# Patient Record
Sex: Male | Born: 1985 | Race: Black or African American | Hispanic: No | Marital: Single | Smoking: Former smoker
Health system: Southern US, Community
[De-identification: ages and names within clinical notes are randomized; demographics above are authoritative.]

## PROBLEM LIST (undated history)

## (undated) DIAGNOSIS — I509 Heart failure, unspecified: Secondary | ICD-10-CM

## (undated) DIAGNOSIS — J449 Chronic obstructive pulmonary disease, unspecified: Secondary | ICD-10-CM

## (undated) DIAGNOSIS — G4730 Sleep apnea, unspecified: Secondary | ICD-10-CM

## (undated) DIAGNOSIS — J45909 Unspecified asthma, uncomplicated: Secondary | ICD-10-CM

## (undated) DIAGNOSIS — E119 Type 2 diabetes mellitus without complications: Secondary | ICD-10-CM

## (undated) DIAGNOSIS — E785 Hyperlipidemia, unspecified: Secondary | ICD-10-CM

## (undated) DIAGNOSIS — R0902 Hypoxemia: Secondary | ICD-10-CM

## (undated) DIAGNOSIS — I10 Essential (primary) hypertension: Secondary | ICD-10-CM

## (undated) DIAGNOSIS — E669 Obesity, unspecified: Secondary | ICD-10-CM

## (undated) HISTORY — DX: Hypoxemia: R09.02

## (undated) HISTORY — PX: TONSILLECTOMY: SUR1361

## (undated) HISTORY — DX: Chronic obstructive pulmonary disease, unspecified: J44.9

## (undated) HISTORY — DX: Heart failure, unspecified: I50.9

## (undated) HISTORY — DX: Unspecified asthma, uncomplicated: J45.909

## (undated) HISTORY — DX: Hyperlipidemia, unspecified: E78.5

---
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
## 2005-03-25 ENCOUNTER — Emergency Department (HOSPITAL_COMMUNITY): Admission: EM | Admit: 2005-03-25 | Discharge: 2005-03-25 | Payer: Self-pay | Admitting: Emergency Medicine

## 2010-10-10 ENCOUNTER — Emergency Department: Payer: Self-pay | Admitting: Emergency Medicine

## 2010-10-15 ENCOUNTER — Emergency Department (HOSPITAL_COMMUNITY)
Admission: EM | Admit: 2010-10-15 | Discharge: 2010-10-15 | Payer: Self-pay | Source: Home / Self Care | Admitting: Emergency Medicine

## 2010-10-15 LAB — RAPID STREP SCREEN (MED CTR MEBANE ONLY): Streptococcus, Group A Screen (Direct): NEGATIVE

## 2011-05-04 ENCOUNTER — Emergency Department: Payer: Self-pay | Admitting: Emergency Medicine

## 2011-12-12 ENCOUNTER — Emergency Department (HOSPITAL_COMMUNITY)
Admission: EM | Admit: 2011-12-12 | Discharge: 2011-12-12 | Disposition: A | Discharge status: Home / Self Care | Payer: Self-pay | Attending: Emergency Medicine | Admitting: Emergency Medicine

## 2011-12-12 ENCOUNTER — Encounter (HOSPITAL_COMMUNITY): Payer: Self-pay | Admitting: *Deleted

## 2011-12-12 DIAGNOSIS — H9209 Otalgia, unspecified ear: Secondary | ICD-10-CM | POA: Insufficient documentation

## 2011-12-12 DIAGNOSIS — J029 Acute pharyngitis, unspecified: Secondary | ICD-10-CM | POA: Insufficient documentation

## 2011-12-12 HISTORY — DX: Obesity, unspecified: E66.9

## 2011-12-12 LAB — RAPID STREP SCREEN (MED CTR MEBANE ONLY): Streptococcus, Group A Screen (Direct): NEGATIVE

## 2011-12-12 MED ORDER — AMOXICILLIN 250 MG/5ML PO SUSR: 500.0000 mg | Freq: Three times a day (TID) | ORAL | Status: AC

## 2011-12-12 MED ORDER — NAPROXEN 500 MG PO TABS: 500.0000 mg | ORAL_TABLET | Freq: Two times a day (BID) | ORAL | Status: AC

## 2011-12-12 MED ORDER — ACETAMINOPHEN 325 MG PO TABS
650.0000 mg | ORAL_TABLET | Freq: Once | ORAL | Status: AC
  Administered 2011-12-12: 650 mg via ORAL
  Filled 2011-12-12: qty 2

## 2011-12-12 MED ORDER — AMOXICILLIN 500 MG PO CAPS: 500.0000 mg | ORAL_CAPSULE | Freq: Three times a day (TID) | ORAL | Status: DC

## 2011-12-12 NOTE — ED Notes (Signed)
Pt reports having sore throat. Airway is intact, resp e/u.

## 2011-12-12 NOTE — ED Provider Notes (Signed)
History     CSN: [id number]  Arrival date & time 12/12/11  1738   First MD Initiated Contact with Patient 12/12/11 1923      Chief Complaint  Patient presents with  . Sore Throat    HPI The patient presents the emergency room with complaints of a sore throat. Patient states the symptoms started last couple days. He has pain with swallowing. He also has noticed pain in his right ear. He denies any cough, fever, nasal congestion, vomiting or diarrhea. The pain is moderate. Past Medical History  Diagnosis Date  . Obesity     History reviewed. No pertinent past surgical history.  History reviewed. No pertinent family history.  History  Substance Use Topics  . Smoking status: Current Everyday Smoker    Types: Cigarettes  . Smokeless tobacco: Not on file  . Alcohol Use: No      Review of Systems  All other systems reviewed and are negative.    Allergies  Review of patient's allergies indicates no known allergies.  Home Medications  No current outpatient prescriptions on file.  BP 164/94  Pulse 92  Temp(Src) 99.2 F (37.3 C) (Oral)  Resp 20  SpO2 97%  Physical Exam  Nursing note and vitals reviewed. Constitutional: He appears well-developed and well-nourished. No distress.  HENT:  Head: Normocephalic and atraumatic.  Right Ear: External ear normal.  Left Ear: External ear normal.  Mouth/Throat: Oropharyngeal exudate and posterior oropharyngeal erythema present. No posterior oropharyngeal edema or tonsillar abscesses.       Right ear occluded by cerumen  Eyes: Conjunctivae are normal. Right eye exhibits no discharge. Left eye exhibits no discharge. No scleral icterus.  Neck: Neck supple. No tracheal deviation present.  Cardiovascular: Normal rate and regular rhythm.   Pulmonary/Chest: Effort normal. No stridor. No respiratory distress. He has no wheezes. He has no rales.  Musculoskeletal: He exhibits no edema.  Neurological: He is alert. Cranial nerve deficit:  no gross deficits.  Skin: Skin is warm and dry. No rash noted.  Psychiatric: He has a normal mood and affect.    ED Course  Procedures (including critical care time)   Labs Reviewed  RAPID STREP SCREEN   No results found.    MDM  Patient has pharyngitis without evidence of abscess.  I am unable to fully evaluate his right ear because of cerumen occlusion.  We'll prescribe the patient a course of amoxicillin. I recommend he follow up with primary if not getting better within a week       Celene Kras, MD 12/12/11 1944

## 2011-12-12 NOTE — Discharge Instructions (Signed)
Sore Throat Sore throats may be caused by bacteria and viruses. They may also be caused by:  Smoking.   Pollution.   Allergies.  If a sore throat is due to strep infection (a bacterial infection), you may need:  A throat swab.   A culture test to verify the strep infection.  You will need one of these:  An antibiotic shot.   Oral medicine for a full 10 days.  Strep infection is very contagious. A doctor should check any close contacts who have a sore throat or fever. A sore throat caused by a virus infection will usually last only 3-4 days. Antibiotics will not treat a viral sore throat.  Infectious mononucleosis (a viral disease), however, can cause a sore throat that lasts for up to 3 weeks. Mononucleosis can be diagnosed with blood tests. You must have been sick for at least 1 week in order for the test to give accurate results. HOME CARE INSTRUCTIONS   To treat a sore throat, take mild pain medicine.   Increase your fluids.   Eat a soft diet.   Do not smoke.   Gargling with warm water or salt water (1 tsp. salt in 8 oz. water) can be helpful.   Try throat sprays or lozenges or sucking on hard candy to ease the symptoms.  Call your doctor if your sore throat lasts longer than 1 week.  SEEK IMMEDIATE MEDICAL CARE IF:  You have difficulty breathing.   You have increased swelling in the throat.   You have pain so severe that you are unable to swallow fluids or your saliva.   You have a severe headache, a high fever, vomiting, or a red rash.  Document Released: 11/05/2004 Document Revised: 06/10/2011 Document Reviewed: 09/15/2007 [facility] Patient Information 2012 [place], [place].Salt Water Gargle This solution will help make your mouth and throat feel better. HOME CARE INSTRUCTIONS   Mix 1 teaspoon of salt in 8 ounces of warm water.   Gargle with this solution as much or often as you need or as directed. Swish and gargle gently if you have any sores or wounds in your  mouth.   Do not swallow this mixture.  Document Released: 07/02/2004 Document Revised: 06/10/2011 Document Reviewed: 11/23/2008 [facility] Patient Information 2012 [place], [place].

## 2015-05-01 ENCOUNTER — Encounter: Payer: Self-pay | Admitting: Emergency Medicine

## 2015-05-01 ENCOUNTER — Emergency Department
Admission: EM | Admit: 2015-05-01 | Discharge: 2015-05-01 | Disposition: A | Discharge status: Home / Self Care | Payer: Self-pay | Attending: Emergency Medicine | Admitting: Emergency Medicine

## 2015-05-01 ENCOUNTER — Other Ambulatory Visit: Payer: Self-pay

## 2015-05-01 DIAGNOSIS — G510 Bell's palsy: Secondary | ICD-10-CM | POA: Insufficient documentation

## 2015-05-01 DIAGNOSIS — R739 Hyperglycemia, unspecified: Secondary | ICD-10-CM | POA: Insufficient documentation

## 2015-05-01 DIAGNOSIS — Z720 Tobacco use: Secondary | ICD-10-CM | POA: Insufficient documentation

## 2015-05-01 DIAGNOSIS — I10 Essential (primary) hypertension: Secondary | ICD-10-CM | POA: Insufficient documentation

## 2015-05-01 HISTORY — DX: Sleep apnea, unspecified: G47.30

## 2015-05-01 LAB — COMPREHENSIVE METABOLIC PANEL
ALT: 41 U/L (ref 17–63)
AST: 30 U/L (ref 15–41)
Albumin: 3.7 g/dL (ref 3.5–5.0)
Alkaline Phosphatase: 69 U/L (ref 38–126)
Anion gap: 10 (ref 5–15)
BUN: 11 mg/dL (ref 6–20)
CO2: 29 mmol/L (ref 22–32)
Calcium: 8.6 mg/dL — ABNORMAL LOW (ref 8.9–10.3)
Chloride: 101 mmol/L (ref 101–111)
Creatinine, Ser: 1.13 mg/dL (ref 0.61–1.24)
GFR calc Af Amer: >60 mL/min (ref >60)
GFR calc non Af Amer: >60 mL/min (ref >60)
Glucose, Bld: 210 mg/dL — ABNORMAL HIGH (ref 65–99)
Potassium: 3.9 mmol/L (ref 3.5–5.1)
Sodium: 140 mmol/L (ref 135–145)
Total Bilirubin: 0.5 mg/dL (ref 0.3–1.2)
Total Protein: 7.5 g/dL (ref 6.5–8.1)

## 2015-05-01 LAB — CBC WITH DIFFERENTIAL/PLATELET
Basophils Absolute: 0 10*3/uL (ref 0–0.1)
Basophils Relative: 0 %
Eosinophils Absolute: 0.1 10*3/uL (ref 0–0.7)
Eosinophils Relative: 1 %
HCT: 43.6 % (ref 40.0–52.0)
Hemoglobin: 13.8 g/dL (ref 13.0–18.0)
Lymphocytes Relative: 24 %
Lymphs Abs: 2.2 10*3/uL (ref 1.0–3.6)
MCH: 27.6 pg (ref 26.0–34.0)
MCHC: 31.7 g/dL — ABNORMAL LOW (ref 32.0–36.0)
MCV: 87.1 fL (ref 80.0–100.0)
Monocytes Absolute: 0.6 10*3/uL (ref 0.2–1.0)
Monocytes Relative: 6 %
Neutro Abs: 6.2 10*3/uL (ref 1.4–6.5)
Neutrophils Relative %: 69 %
Platelets: 239 10*3/uL (ref 150–440)
RBC: 5 MIL/uL (ref 4.40–5.90)
RDW: 16.5 % — ABNORMAL HIGH (ref 11.5–14.5)
WBC: 9 10*3/uL (ref 3.8–10.6)

## 2015-05-01 LAB — TROPONIN I: Troponin I: 0.03 ng/mL (ref <0.031)

## 2015-05-01 MED ORDER — VALACYCLOVIR HCL 1 G PO TABS: 1000.0000 mg | ORAL_TABLET | Freq: Three times a day (TID) | ORAL | Status: DC

## 2015-05-01 MED ORDER — POLYMYXIN B-TRIMETHOPRIM 10000-0.1 UNIT/ML-% OP SOLN: 2.0000 [drp] | OPHTHALMIC | Status: DC

## 2015-05-01 MED ORDER — PREDNISONE 10 MG PO TABS: 10.0000 mg | ORAL_TABLET | Freq: Every day | ORAL | Status: DC

## 2015-05-01 MED ORDER — METFORMIN HCL 500 MG PO TABS: 500.0000 mg | ORAL_TABLET | Freq: Two times a day (BID) | ORAL | Status: DC

## 2015-05-01 MED ORDER — HYDROCHLOROTHIAZIDE 25 MG PO TABS: 25.0000 mg | ORAL_TABLET | Freq: Every day | ORAL | Status: DC

## 2015-05-01 MED ORDER — PREDNISONE 20 MG PO TABS
60.0000 mg | ORAL_TABLET | Freq: Once | ORAL | Status: AC
Start: 2015-05-01 — End: 2015-05-01
  Administered 2015-05-01: 60 mg via ORAL
  Filled 2015-05-01: qty 3

## 2015-05-01 NOTE — ED Provider Notes (Signed)
[facility] Emergency Department Provider Note     Time seen: ----------------------------------------- 2:27 PM on 05/01/2015 -----------------------------------------    I have reviewed the triage vital signs and the nursing notes.   HISTORY  Chief Complaint Cellulitis    HPI Ross Schmidt is a 29 y.o. male who presents ER with right-sided facial swelling, numbness and paresthesias for the last 5 days. Patient states when he tries to drink something water runs out of his mouth, he can't close his eye as well on the right side. He says his right eye has been matted after sleep. He denies having this happen before. Has never had his blood pressure checked before. Nothing makes his symptoms better or worse.  Past Medical History  Diagnosis Date  . Obesity   . Sleep apnea     There are no active problems to display for this patient.   Past Surgical History  Procedure Laterality Date  . Tonsillectomy      Allergies Review of patient's allergies indicates no known allergies.  Social History History  Substance Use Topics  . Smoking status: Current Every Day Smoker    Types: Cigarettes  . Smokeless tobacco: Not on file  . Alcohol Use: No    Review of Systems Constitutional: Negative for fever. Eyes: Negative for visual changes. ENT: Negative for sore throat. Cardiovascular: Negative for chest pain. Respiratory: Negative for shortness of breath. Gastrointestinal: Negative for abdominal pain, vomiting and diarrhea. Genitourinary: Negative for dysuria. Musculoskeletal: Negative for back pain. Skin: Negative for rash. Neurological: Positive for right-sided facial paresthesias, paralysis  10-point ROS otherwise negative.  ____________________________________________   PHYSICAL EXAM:  VITAL SIGNS: ED Triage Vitals  Enc Vitals Group     BP 05/01/15 1405 181/100 mmHg     Pulse Rate 05/01/15 1405 103     Resp --      Temp 05/01/15  1405 99 F (37.2 C)     Temp Source 05/01/15 1405 Oral     SpO2 05/01/15 1405 90 %     Weight 05/01/15 1407 361 lb (163.749 kg)     Height 05/01/15 1405  (1.753 m)     Head Cir --      Peak Flow --      Pain Score --      Pain Loc --      Pain Edu? --      Excl. in GC? --     Constitutional: Alert and oriented. Well appearing and in no distress. Morbidly obese Eyes: Conjunctivae are normal. PERRL. Normal extraocular movements. There are dried secretions noted around the right eye ENT   Head: Normocephalic and atraumatic.   Nose: No congestion/rhinnorhea.   Mouth/Throat: Mucous membranes are moist.   Neck: No stridor. Cardiovascular: Normal rate, regular rhythm. Normal and symmetric distal pulses are present in all extremities. No murmurs, rubs, or gallops. Respiratory: Normal respiratory effort without tachypnea nor retractions. Breath sounds are clear and equal bilaterally. No wheezes/rales/rhonchi. Gastrointestinal: Soft and nontender. No distention. No abdominal bruits. There is no CVA tenderness. Musculoskeletal: Nontender with normal range of motion in all extremities. No joint effusions.  No lower extremity tenderness nor edema. Neurologic:  Normal speech and language. 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 Forehead is not spared Skin:  Skin is warm, dry and intact. No rash noted. Psychiatric: Mood and affect are normal. Speech and behavior are normal. Patient exhibits appropriate insight and judgment. ____________________________________________  EKG: Interpreted by me. Normal sinus rhythm, ST  and T-wave changes, rightward axis. Borderline long QT interval. Rate is 97 bpm  ____________________________________________  ED COURSE:  Pertinent labs & imaging results that were available during my care of the patient were reviewed by me and considered in my medical decision making (see chart for details). Patient clinically with Bell's palsy,  however noted to be borderline hypoxic and markedly hypertensive. Patient will receive basic lab work and reevaluation ____________________________________________    LABS (pertinent positives/negatives)  Labs Reviewed  CBC WITH DIFFERENTIAL/PLATELET - Abnormal; Notable for the following:    MCHC 31.7 (*)    RDW 16.5 (*)    All other components within normal limits  COMPREHENSIVE METABOLIC PANEL - Abnormal; Notable for the following:    Glucose, Bld 210 (*)    Calcium 8.6 (*)    All other components within normal limits  TROPONIN I    ____________________________________________  FINAL ASSESSMENT AND PLAN  Bell's palsy, hypertension, hypoglycemia  Plan: Patient with labs and imaging as dictated above. Patient likely has developed diabetes, will start metformin. We'll also need to be started on high blood pressure medication. Patient be given steroid taper as well as valacyclovir to treat his Bell's palsy. He is encouraged to have close follow-up with his doctor for reevaluation.   Emily Filbert, MD   Emily Filbert, MD 05/01/15 [phone]

## 2015-05-01 NOTE — ED Notes (Signed)
Pt with right side of face swollen, numb x 5 days. Eye matted after sleep.

## 2015-05-01 NOTE — ED Notes (Signed)
Pt requesting something to eat at this time, verbal okay given by MD williams. Pt given sandwhich tray at this time, eating in bed tolerating well.

## 2015-05-01 NOTE — Discharge Instructions (Signed)
Bell's Palsy Bell's palsy is a condition in which the muscles on one side of the face cannot move (paralysis). This is because the nerves in the face are paralyzed. It is most often thought to be caused by a virus. The virus causes swelling of the nerve that controls movement on one side of the face. The nerve travels through a tight space surrounded by bone. When the nerve swells, it can be compressed by the bone. This results in damage to the protective covering around the nerve. This damage interferes with how the nerve communicates with the muscles of the face. As a result, it can cause weakness or paralysis of the facial muscles.  Injury (trauma), tumor, and surgery may cause Bell's palsy, but most of the time the cause is unknown. It is a relatively common condition. It starts suddenly (abrupt onset) with the paralysis usually ending within 2 days. Bell's palsy is not dangerous. But because the eye does not close properly, you may need care to keep the eye from getting dry. This can include splinting (to keep the eye shut) or moistening with artificial tears. Bell's palsy very seldom occurs on both sides of the face at the same time. SYMPTOMS   Eyebrow sagging.  Drooping of the eyelid and corner of the mouth.  Inability to close one eye.  Loss of taste on the front of the tongue.  Sensitivity to loud noises. TREATMENT  The treatment is usually non-surgical. If the patient is seen within the first 24 to 48 hours, a short course of steroids may be prescribed, in an attempt to shorten the length of the condition. Antiviral medicines may also be used with the steroids, but it is unclear if they are helpful.  You will need to protect your eye, if you cannot close it. The cornea (clear covering over your eye) will become dry and can be damaged. Artificial tears can be used to keep your eye moist. Glasses or an eye patch should be worn to protect your eye. PROGNOSIS  Recovery is variable, ranging  from days to months. Although the problem usually goes away completely (about 80% of cases resolve), predicting the outcome is impossible. Most people improve within 3 weeks of when the symptoms began. Improvement may continue for 3 to 6 months. A small number of people have moderate to severe weakness that is permanent.  HOME CARE INSTRUCTIONS   If your caregiver prescribed medication to reduce swelling in the nerve, use as directed. Do not stop taking the medication unless directed by your caregiver.  Use moisturizing eye drops as needed to prevent drying of your eye, as directed by your caregiver.  Protect your eye, as directed by your caregiver.  Use facial massage and exercises, as directed by your caregiver.  Perform your normal activities, and get your normal rest. SEEK IMMEDIATE MEDICAL CARE IF:   There is pain, redness or irritation in the eye.  You or your child has an oral temperature above 102 F (38.9 C), not controlled by medicine. MAKE SURE YOU:   Understand these instructions.  Will watch your condition.  Will get help right away if you are not doing well or get worse. Document Released: 09/28/2005 Document Revised: 12/21/2011 Document Reviewed: 01/05/2014 [facility] Patient Information 2015 [place], [place]. This information is not intended to replace advice given to you by your health care provider. Make sure you discuss any questions you have with your health care provider.  Hypertension Hypertension, commonly called high blood pressure, is  when the force of blood pumping through your arteries is too strong. Your arteries are the blood vessels that carry blood from your heart throughout your body. A blood pressure reading consists of a higher number over a lower number, such as 110/72. The higher number (systolic) is the pressure inside your arteries when your heart pumps. The lower number (diastolic) is the pressure inside your arteries when your heart relaxes. Ideally  you want your blood pressure below 120/80. Hypertension forces your heart to work harder to pump blood. Your arteries may become narrow or stiff. Having hypertension puts you at risk for heart disease, stroke, and other problems.  RISK FACTORS Some risk factors for high blood pressure are controllable. Others are not.  Risk factors you cannot control include:   Race. You may be at higher risk if you are African American.  Age. Risk increases with age.  Gender. Men are at higher risk than women before age 35 years. After age 53, women are at higher risk than men. Risk factors you can control include:  Not getting enough exercise or physical activity.  Being overweight.  Getting too much fat, sugar, calories, or salt in your diet.  Drinking too much alcohol. SIGNS AND SYMPTOMS Hypertension does not usually cause signs or symptoms. Extremely high blood pressure (hypertensive crisis) may cause headache, anxiety, shortness of breath, and nosebleed. DIAGNOSIS  To check if you have hypertension, your health care provider will measure your blood pressure while you are seated, with your arm held at the level of your heart. It should be measured at least twice using the same arm. Certain conditions can cause a difference in blood pressure between your right and left arms. A blood pressure reading that is higher than normal on one occasion does not mean that you need treatment. If one blood pressure reading is high, ask your health care provider about having it checked again. TREATMENT  Treating high blood pressure includes making lifestyle changes and possibly taking medicine. Living a healthy lifestyle can help lower high blood pressure. You may need to change some of your habits. Lifestyle changes may include:  Following the DASH diet. This diet is high in fruits, vegetables, and whole grains. It is low in salt, red meat, and added sugars.  Getting at least 2 hours of brisk physical activity  every week.  Losing weight if necessary.  Not smoking.  Limiting alcoholic beverages.  Learning ways to reduce stress. If lifestyle changes are not enough to get your blood pressure under control, your health care provider may prescribe medicine. You may need to take more than one. Work closely with your health care provider to understand the risks and benefits. HOME CARE INSTRUCTIONS  Have your blood pressure rechecked as directed by your health care provider.   Take medicines only as directed by your health care provider. Follow the directions carefully. Blood pressure medicines must be taken as prescribed. The medicine does not work as well when you skip doses. Skipping doses also puts you at risk for problems.   Do not smoke.   Monitor your blood pressure at home as directed by your health care provider. SEEK MEDICAL CARE IF:   You think you are having a reaction to medicines taken.  You have recurrent headaches or feel dizzy.  You have swelling in your ankles.  You have trouble with your vision. SEEK IMMEDIATE MEDICAL CARE IF:  You develop a severe headache or confusion.  You have unusual weakness, numbness, or  feel faint.  You have severe chest or abdominal pain.  You vomit repeatedly.  You have trouble breathing. MAKE SURE YOU:   Understand these instructions.  Will watch your condition.  Will get help right away if you are not doing well or get worse. Document Released: 09/28/2005 Document Revised: 02/12/2014 Document Reviewed: 07/21/2013 [facility] Patient Information 2015 [place], [place]. This information is not intended to replace advice given to you by your health care provider. Make sure you discuss any questions you have with your health care provider.  High Blood Sugar High blood sugar (hyperglycemia) means that the level of sugar in your blood is higher than it should be. Signs of high blood sugar include:  Feeling thirsty.  Frequent peeing  (urinating).  Feeling tired or sleepy.  Dry mouth.  Vision changes.  Feeling weak.  Feeling hungry but losing weight.  Numbness and tingling in your hands or feet.  Headache. When you ignore these signs, your blood sugar may keep going up. These problems may get worse, and other problems may begin. HOME CARE  Check your blood sugars as told by your doctor. Write down the numbers with the date and time.  Take the right amount of insulin or diabetes pills at the right time. Write down the dose with date and time.  Refill your insulin or diabetes pills before running out.  Watch what you eat. Follow your meal plan.  Drink liquids without sugar, such as water. Check with your doctor if you have kidney or heart disease.  Follow your doctor's orders for exercise. Exercise at the same time of day.  Keep your doctor's appointments. GET HELP RIGHT AWAY IF:   You have trouble thinking or are confused.  You have fast breathing with fruity smelling breath.  You pass out (faint).  You have 2 to 3 days of high blood sugars and you do not know why.  You have chest pain.  You are feeling sick to your stomach (nauseous) or throwing up (vomiting).  You have sudden vision changes. MAKE SURE YOU:   Understand these instructions.  Will watch your condition.  Will get help right away if you are not doing well or get worse. Document Released: 07/26/2009 Document Revised: 12/21/2011 Document Reviewed: 07/26/2009 [facility] Patient Information 2015 [place], [place]. This information is not intended to replace advice given to you by your health care provider. Make sure you discuss any questions you have with your health care provider.

## 2015-05-01 NOTE — ED Notes (Signed)
MD Williams at bedside.  

## 2015-07-03 ENCOUNTER — Emergency Department
Admission: EM | Admit: 2015-07-03 | Discharge: 2015-07-03 | Disposition: A | Discharge status: Home / Self Care | Payer: Self-pay | Attending: Emergency Medicine | Admitting: Emergency Medicine

## 2015-07-03 ENCOUNTER — Encounter: Payer: Self-pay | Admitting: Emergency Medicine

## 2015-07-03 DIAGNOSIS — I10 Essential (primary) hypertension: Secondary | ICD-10-CM | POA: Insufficient documentation

## 2015-07-03 DIAGNOSIS — L089 Local infection of the skin and subcutaneous tissue, unspecified: Secondary | ICD-10-CM | POA: Insufficient documentation

## 2015-07-03 DIAGNOSIS — Z720 Tobacco use: Secondary | ICD-10-CM | POA: Insufficient documentation

## 2015-07-03 DIAGNOSIS — E119 Type 2 diabetes mellitus without complications: Secondary | ICD-10-CM | POA: Insufficient documentation

## 2015-07-03 DIAGNOSIS — Z79899 Other long term (current) drug therapy: Secondary | ICD-10-CM | POA: Insufficient documentation

## 2015-07-03 DIAGNOSIS — B9689 Other specified bacterial agents as the cause of diseases classified elsewhere: Secondary | ICD-10-CM

## 2015-07-03 HISTORY — DX: Essential (primary) hypertension: I10

## 2015-07-03 MED ORDER — SULFAMETHOXAZOLE-TRIMETHOPRIM 800-160 MG PO TABS: 1.0000 | ORAL_TABLET | Freq: Two times a day (BID) | ORAL | Status: DC

## 2015-07-03 MED ORDER — OXYCODONE-ACETAMINOPHEN 7.5-325 MG PO TABS: 1.0000 | ORAL_TABLET | Freq: Four times a day (QID) | ORAL | Status: DC | PRN

## 2015-07-03 MED ORDER — OXYCODONE-ACETAMINOPHEN 5-325 MG PO TABS
2.0000 | ORAL_TABLET | Freq: Once | ORAL | Status: AC
  Administered 2015-07-03: 2 via ORAL
  Filled 2015-07-03: qty 2

## 2015-07-03 MED ORDER — IBUPROFEN 800 MG PO TABS: 800.0000 mg | ORAL_TABLET | Freq: Three times a day (TID) | ORAL | Status: DC | PRN

## 2015-07-03 NOTE — Discharge Instructions (Signed)
Abscess An abscess is an infected area that contains a collection of pus and debris.It can occur in almost any part of the body. An abscess is also known as a furuncle or boil. CAUSES  An abscess occurs when tissue gets infected. This can occur from blockage of oil or sweat glands, infection of hair follicles, or a minor injury to the skin. As the body tries to fight the infection, pus collects in the area and creates pressure under the skin. This pressure causes pain. People with weakened immune systems have difficulty fighting infections and get certain abscesses more often.  SYMPTOMS Usually an abscess develops on the skin and becomes a painful mass that is red, warm, and tender. If the abscess forms under the skin, you may feel a moveable soft area under the skin. Some abscesses break open (rupture) on their own, but most will continue to get worse without care. The infection can spread deeper into the body and eventually into the bloodstream, causing you to feel ill.  DIAGNOSIS  Your caregiver will take your medical history and perform a physical exam. A sample of fluid may also be taken from the abscess to determine what is causing your infection. TREATMENT  Your caregiver may prescribe antibiotic medicines to fight the infection. However, taking antibiotics alone usually does not cure an abscess. Your caregiver may need to make a small cut (incision) in the abscess to drain the pus. In some cases, gauze is packed into the abscess to reduce pain and to continue draining the area. HOME CARE INSTRUCTIONS   Only take over-the-counter or prescription medicines for pain, discomfort, or fever as directed by your caregiver.  If you were prescribed antibiotics, take them as directed. Finish them even if you start to feel better.  If gauze is used, follow your caregiver's directions for changing the gauze.  To avoid spreading the infection:  Keep your draining abscess covered with a  bandage.  Wash your hands well.  Do not share personal care items, towels, or whirlpools with others.  Avoid skin contact with others.  Keep your skin and clothes clean around the abscess.  Keep all follow-up appointments as directed by your caregiver. SEEK MEDICAL CARE IF:   You have increased pain, swelling, redness, fluid drainage, or bleeding.  You have muscle aches, chills, or a general ill feeling.  You have a fever. MAKE SURE YOU:   Understand these instructions.  Will watch your condition.  Will get help right away if you are not doing well or get worse. Document Released: 07/08/2005 Document Revised: 03/29/2012 Document Reviewed: 12/11/2011 [facility] Patient Information 2015 [place], [place]. This information is not intended to replace advice given to you by your health care provider. Make sure you discuss any questions you have with your health care provider. Warm compresses to area for 10 minutes 4 times a day.

## 2015-07-03 NOTE — ED Provider Notes (Signed)
[facility] Emergency Department Sammi Stolarz Note  ____________________________________________  Time seen: Approximately 12:54 PM  I have reviewed the triage vital signs and the nursing notes.   HISTORY  Chief Complaint Abscess    HPI Ross Schmidt is a 29 y.o. male   Nausea lesion to the back of his neck for approximately a month. Patient stated the last week area has increased in size. States also stated this increased pain. No palliative measures for this complaint. Patient rates pain as a 10 over 10. Past Medical History  Diagnosis Date  . Obesity   . Sleep apnea   . Hypertension     There are no active problems to display for this patient.   Past Surgical History  Procedure Laterality Date  . Tonsillectomy      Current Outpatient Rx  Name  Route  Sig  Dispense  Refill  . hydrochlorothiazide (HYDRODIURIL) 25 MG tablet   Oral   Take 1 tablet (25 mg total) by mouth daily.   30 tablet   1   . metFORMIN (GLUCOPHAGE) 500 MG tablet   Oral   Take 1 tablet (500 mg total) by mouth 2 (two) times daily with a meal.   60 tablet   11   . predniSONE (DELTASONE) 10 MG tablet   Oral   Take 1 tablet (10 mg total) by mouth daily. 60 mg today, then decrease by 10 mg daily until gone.   21 tablet   0   . trimethoprim-polymyxin b (POLYTRIM) ophthalmic solution   Right Eye   Place 2 drops into the right eye every 4 (four) hours.   10 mL   0   . valACYclovir (VALTREX) 1000 MG tablet   Oral   Take 1 tablet (1,000 mg total) by mouth 3 (three) times daily.   21 tablet   0     Allergies Review of patient's allergies indicates no known allergies.  No family history on file.  Social History Social History  Substance Use Topics  . Smoking status: Current Every Day Smoker    Types: Cigarettes  . Smokeless tobacco: None  . Alcohol Use: No    Review of Systems Constitutional: No fever/chills. Obesity Eyes: No visual changes. ENT: No sore  throat. Cardiovascular: Denies chest pain. Respiratory: Denies shortness of breath. Gastrointestinal: No abdominal pain.  No nausea, no vomiting.  No diarrhea.  No constipation. Genitourinary: Negative for dysuria. Musculoskeletal: Negative for back pain. Skin: Negative for rash. Neurological: Negative for headaches, focal weakness or numbness. Endocrine:Diabetes and hypertension. 10-point ROS otherwise negative.  ____________________________________________   PHYSICAL EXAM:  VITAL SIGNS: ED Triage Vitals  Enc Vitals Group     BP 07/03/15 1152 169/100 mmHg     Pulse Rate 07/03/15 1152 99     Resp 07/03/15 1152 20     Temp 07/03/15 1152 98.6 F (37 C)     Temp Source 07/03/15 1152 Oral     SpO2 07/03/15 1152 99 %     Weight 07/03/15 1152 340 lb (154.223 kg)     Height 07/03/15 1152 5\' 10"  (1.778 m)     Head Cir --      Peak Flow --      Pain Score 07/03/15 1153 10     Pain Loc --      Pain Edu? --      Excl. in GC? --     Constitutional: Alert and oriented. Well appearing and in no acute distress. Morbidly  obese Eyes: Conjunctivae are normal. PERRL. EOMI. Head: Atraumatic. Nose: No congestion/rhinnorhea. Mouth/Throat: Mucous membranes are moist.  Oropharynx non-erythematous. Neck: No stridor.   Hematological/Lymphatic/Immunilogical: No cervical lymphadenopathy. Cardiovascular: Normal rate, regular rhythm. Grossly normal heart sounds.  Good peripheral circulation. Limited BP Respiratory: Normal respiratory effort.  No retractions. Lungs CTAB. Gastrointestinal: Soft and nontender. No distention. No abdominal bruits. No CVA tenderness. Musculoskeletal: No lower extremity tenderness nor edema.  No joint effusions. Neurologic:  Normal speech and language. No gross focal neurologic deficits are appreciated. No gait instability. Skin:  Skin is warm, dry and intact. No rash noted. Psychiatric: Mood and affect are normal. Speech and behavior are  normal.  ____________________________________________   LABS (all labs ordered are listed, but only abnormal results are displayed)  Labs Reviewed - No data to display ____________________________________________  EKG   ____________________________________________  RADIOLOGY   ____________________________________________   PROCEDURES  Procedure(s) performed: None  Critical Care performed: No  ____________________________________________   INITIAL IMPRESSION / ASSESSMENT AND PLAN / ED COURSE  Pertinent labs & imaging results that were available during my care of the patient were reviewed by me and considered in my medical decision making (see chart for details).  Skin infection posterior neck. Area is nonfluctuant at this time. Discussed rationale for not perform an I&D at this time. Patient advised to take Bactrim DS, Percocets, and ibuprofen. Patient is advised on home care. Patient advised return by ER in 2 days reevaluation I&D. ____________________________________________   FINAL CLINICAL IMPRESSION(S) / ED DIAGNOSES  Final diagnoses:  Bacterial skin infection      Joni Reining, PA-C 07/03/15 1258  Phineas Semen, MD 07/03/15 [phone]

## 2015-07-03 NOTE — ED Notes (Signed)
Pt  With possible abscess to back of neck for two weeks, very painful.

## 2015-07-03 NOTE — ED Notes (Signed)
He noticed a large swollen tender area to back of neck couple of weeks ago. States area is larger with increased pain. Swelling noted mid cervical to under right ear

## 2015-07-08 ENCOUNTER — Emergency Department
Admission: EM | Admit: 2015-07-08 | Discharge: 2015-07-08 | Disposition: A | Discharge status: Home / Self Care | Payer: Self-pay | Attending: Emergency Medicine | Admitting: Emergency Medicine

## 2015-07-08 ENCOUNTER — Encounter: Payer: Self-pay | Admitting: Emergency Medicine

## 2015-07-08 ENCOUNTER — Emergency Department: Payer: Self-pay

## 2015-07-08 DIAGNOSIS — Z792 Long term (current) use of antibiotics: Secondary | ICD-10-CM | POA: Insufficient documentation

## 2015-07-08 DIAGNOSIS — Z7952 Long term (current) use of systemic steroids: Secondary | ICD-10-CM | POA: Insufficient documentation

## 2015-07-08 DIAGNOSIS — Z0189 Encounter for other specified special examinations: Secondary | ICD-10-CM

## 2015-07-08 DIAGNOSIS — E119 Type 2 diabetes mellitus without complications: Secondary | ICD-10-CM | POA: Insufficient documentation

## 2015-07-08 DIAGNOSIS — Z720 Tobacco use: Secondary | ICD-10-CM | POA: Insufficient documentation

## 2015-07-08 DIAGNOSIS — L03221 Cellulitis of neck: Secondary | ICD-10-CM | POA: Insufficient documentation

## 2015-07-08 DIAGNOSIS — Z79899 Other long term (current) drug therapy: Secondary | ICD-10-CM | POA: Insufficient documentation

## 2015-07-08 DIAGNOSIS — L0211 Cutaneous abscess of neck: Secondary | ICD-10-CM | POA: Insufficient documentation

## 2015-07-08 DIAGNOSIS — Z7689 Persons encountering health services in other specified circumstances: Secondary | ICD-10-CM

## 2015-07-08 DIAGNOSIS — I10 Essential (primary) hypertension: Secondary | ICD-10-CM | POA: Insufficient documentation

## 2015-07-08 HISTORY — DX: Type 2 diabetes mellitus without complications: E11.9

## 2015-07-08 LAB — CBC
HCT: 45.5 % (ref 40.0–52.0)
Hemoglobin: 14.3 g/dL (ref 13.0–18.0)
MCH: 27.3 pg (ref 26.0–34.0)
MCHC: 31.3 g/dL — ABNORMAL LOW (ref 32.0–36.0)
MCV: 87.2 fL (ref 80.0–100.0)
Platelets: 248 10*3/uL (ref 150–440)
RBC: 5.23 MIL/uL (ref 4.40–5.90)
RDW: 17.7 % — ABNORMAL HIGH (ref 11.5–14.5)
WBC: 11.9 10*3/uL — ABNORMAL HIGH (ref 3.8–10.6)

## 2015-07-08 LAB — BASIC METABOLIC PANEL
Anion gap: 8 (ref 5–15)
BUN: 10 mg/dL (ref 6–20)
CO2: 34 mmol/L — ABNORMAL HIGH (ref 22–32)
Calcium: 9.1 mg/dL (ref 8.9–10.3)
Chloride: 99 mmol/L — ABNORMAL LOW (ref 101–111)
Creatinine, Ser: 1.02 mg/dL (ref 0.61–1.24)
GFR calc Af Amer: >60 mL/min (ref >60)
GFR calc non Af Amer: >60 mL/min (ref >60)
Glucose, Bld: 204 mg/dL — ABNORMAL HIGH (ref 65–99)
Potassium: 4.2 mmol/L (ref 3.5–5.1)
Sodium: 141 mmol/L (ref 135–145)

## 2015-07-08 MED ORDER — IOHEXOL 300 MG/ML  SOLN
80.0000 mL | Freq: Once | INTRAMUSCULAR | Status: DC | PRN
  Filled 2015-07-08: qty 80

## 2015-07-08 MED ORDER — LIDOCAINE-EPINEPHRINE (PF) 1 %-1:200000 IJ SOLN
30.0000 mL | Freq: Once | INTRAMUSCULAR | Status: DC
  Filled 2015-07-08: qty 30

## 2015-07-08 MED ORDER — KETOROLAC TROMETHAMINE 30 MG/ML IJ SOLN
30.0000 mg | Freq: Once | INTRAMUSCULAR | Status: AC
  Administered 2015-07-08: 30 mg via INTRAVENOUS
  Filled 2015-07-08: qty 1

## 2015-07-08 MED ORDER — OXYCODONE-ACETAMINOPHEN 5-325 MG PO TABS
1.0000 | ORAL_TABLET | Freq: Once | ORAL | Status: AC
  Administered 2015-07-08: 1 via ORAL
  Filled 2015-07-08: qty 1

## 2015-07-08 MED ORDER — IOHEXOL 350 MG/ML SOLN
80.0000 mL | Freq: Once | INTRAVENOUS | Status: AC | PRN
  Administered 2015-07-08: 80 mL via INTRAVENOUS
  Filled 2015-07-08: qty 80

## 2015-07-08 MED ORDER — CLINDAMYCIN PHOSPHATE 900 MG/50ML IV SOLN
900.0000 mg | Freq: Once | INTRAVENOUS | Status: AC
  Administered 2015-07-08: 900 mg via INTRAVENOUS
  Filled 2015-07-08: qty 50

## 2015-07-08 MED ORDER — CLINDAMYCIN HCL 300 MG PO CAPS: 300.0000 mg | ORAL_CAPSULE | Freq: Four times a day (QID) | ORAL | Status: DC

## 2015-07-08 MED ORDER — OXYCODONE-ACETAMINOPHEN 5-325 MG PO TABS: 1.0000 | ORAL_TABLET | Freq: Three times a day (TID) | ORAL | Status: DC | PRN

## 2015-07-08 NOTE — ED Provider Notes (Signed)
[facility] Emergency Department Elanor Cale Note ____________________________________________  Time seen: 1535  I have reviewed the triage vital signs and the nursing notes.  HISTORY  Chief Complaint  Abscess  HPI Ross Schmidt is a 29 y.o. male returns to the ED for re-evaluation of a posterior neck abscess. He was started on empiric antibiotics and pain medicine for a deep, firm, non-fluctuant abscess formation, which was not appropriate for I&D as it presented last week. The patient admits to being poorly compliant with antibiotics in the interim. He c/o increased pain, swelling, tightness, and referral of pain into the RUE. He also notes interim sweats and malaise.   Past Medical History  Diagnosis Date  . Obesity   . Sleep apnea   . Hypertension   . Diabetes mellitus without complication     There are no active problems to display for this patient.   Past Surgical History  Procedure Laterality Date  . Tonsillectomy      Current Outpatient Rx  Name  Route  Sig  Dispense  Refill  . clindamycin (CLEOCIN) 300 MG capsule   Oral   Take 1 capsule (300 mg total) by mouth 4 (four) times daily.   40 capsule   0   . hydrochlorothiazide (HYDRODIURIL) 25 MG tablet   Oral   Take 1 tablet (25 mg total) by mouth daily.   30 tablet   1   . ibuprofen (ADVIL,MOTRIN) 800 MG tablet   Oral   Take 1 tablet (800 mg total) by mouth every 8 (eight) hours as needed for moderate pain.   15 tablet   0   . metFORMIN (GLUCOPHAGE) 500 MG tablet   Oral   Take 1 tablet (500 mg total) by mouth 2 (two) times daily with a meal.   60 tablet   11   . oxyCODONE-acetaminophen (ROXICET) 5-325 MG per tablet   Oral   Take 1-2 tablets by mouth every 8 (eight) hours as needed for moderate pain or severe pain.   12 tablet   0   . predniSONE (DELTASONE) 10 MG tablet   Oral   Take 1 tablet (10 mg total) by mouth daily. 60 mg today, then decrease by 10 mg daily until gone.   21 tablet   0   . sulfamethoxazole-trimethoprim (BACTRIM DS,SEPTRA DS) 800-160 MG per tablet   Oral   Take 1 tablet by mouth 2 (two) times daily.   20 tablet   0   . trimethoprim-polymyxin b (POLYTRIM) ophthalmic solution   Right Eye   Place 2 drops into the right eye every 4 (four) hours.   10 mL   0   . valACYclovir (VALTREX) 1000 MG tablet   Oral   Take 1 tablet (1,000 mg total) by mouth 3 (three) times daily.   21 tablet   0    Allergies Review of patient's allergies indicates no known allergies.  No family history on file.  Social History Social History  Substance Use Topics  . Smoking status: Current Every Day Smoker    Types: Cigarettes  . Smokeless tobacco: None  . Alcohol Use: No   Review of Systems  Constitutional: Negative for fever. Eyes: Negative for visual changes. ENT: Negative for sore throat. Cardiovascular: Negative for chest pain. Respiratory: Negative for shortness of breath. Gastrointestinal: Negative for abdominal pain, vomiting and diarrhea. Genitourinary: Negative for dysuria. Musculoskeletal: Negative for back pain. Skin: Negative for rash. Expanding abscess as above Neurological: Negative for headaches, focal  weakness or numbness. ____________________________________________  PHYSICAL EXAM:  VITAL SIGNS: ED Triage Vitals  Enc Vitals Group     BP --      Pulse --      Resp --      Temp --      Temp src --      SpO2 --      Weight --      Height --      Head Cir --      Peak Flow --      Pain Score 07/08/15 1550 9     Pain Loc --      Pain Edu? --      Excl. in GC? --    Constitutional: Alert and oriented. Well appearing and in no distress. Obese male with neck flexion to accommodate posterior cellulitis.  Eyes: Conjunctivae are normal. PERRL. Normal extraocular movements. ENT   Head: Normocephalic and atraumatic.   Nose: No congestion/rhinorrhea.   Mouth/Throat: Mucous membranes are moist.   Neck: Supple.  No thyromegaly. Large neck due to body habitus. Hypertrophic skin of the neck with posterior firmness and induration consistent with underlying abscess.  Hematological/Lymphatic/Immunological: No cervical lymphadenopathy. Cardiovascular: Normal rate, regular rhythm.  Respiratory: Normal respiratory effort. No wheezes/rales/rhonchi. Gastrointestinal: Soft and nontender. No distention. Musculoskeletal: Nontender with normal range of motion in all extremities.  Neurologic:  Normal gait without ataxia. Normal speech and language. No gross focal neurologic deficits are appreciated. Skin:  Skin is warm, dry and intact. No rash noted. Psychiatric: Mood and affect are normal. Patient exhibits appropriate insight and judgment. ____________________________________________   LABS (pertinent positives/negatives) Labs Reviewed  CBC - Abnormal; Notable for the following:    WBC 11.9 (*)    MCHC 31.3 (*)    RDW 17.7 (*)    All other components within normal limits  BASIC METABOLIC PANEL - Abnormal; Notable for the following:    Chloride 99 (*)    CO2 34 (*)    Glucose, Bld 204 (*)    All other components within normal limits  ___________________________________   RADIOLOGY  Soft Tissue Neck CT w/ Contrast FINDINGS: Soft tissues: There is a 6 x 5 x 4 cm abscess in the subcutaneous fat of the posterior aspect of the neck superficial to the fascia of right trapezius muscle just to the right of midline. The abscess appears to extend to the skin surface at superior aspect of the abscess, best seen on image 70 of series 4. There is no deep extension. Edema in the adjacent subcutaneous fat consistent with adjacent cellulitis.  I, Menshew, Charlesetta Ivory, personally viewed and evaluated these images (plain radiographs) as part of my medical decision making.  ____________________________________________  PROCEDURES  Toradol 30 mg IVP Clindamycin 600 mg IVP Percocet 5-325 mg x 2  INCISION AND  DRAINAGE Performed by: Lissa Hoard Consent: Verbal consent obtained. Risks and benefits: risks, benefits and alternatives were discussed Type: abscess  Body area: posterior neck  Anesthesia: local infiltration  Incision was made with a scalpel.  Local anesthetic: lidocaine 1% w/ epinephrine  Anesthetic total: 10 ml  Complexity: complex Blunt dissection to break up loculations  Drainage: purulent  Drainage amount: 20 ml  Packing material: 1/4 in iodoform gauze  Patient tolerance: Patient tolerated the procedure well with no immediate complications. ____________________________________________  INITIAL IMPRESSION / ASSESSMENT AND PLAN / ED COURSE  Posterior neck abscess with surrounding cellulitis, s/p I&D. Wound care instructions provided. Patient to RTED in  3 days for wound check. Prescription Clindamycin and Percocet provided.  ____________________________________________  FINAL CLINICAL IMPRESSION(S) / ED DIAGNOSES  Final diagnoses:  Cellulitis and abscess of neck  Encounter for incision and drainage procedure     Lissa Hoard, PA-C 07/08/15 1853  Jennye Moccasin, MD 07/08/15 1949

## 2015-07-08 NOTE — Discharge Instructions (Signed)
Abscess An abscess is an infected area that contains a collection of pus and debris.It can occur in almost any part of the body. An abscess is also known as a furuncle or boil. CAUSES  An abscess occurs when tissue gets infected. This can occur from blockage of oil or sweat glands, infection of hair follicles, or a minor injury to the skin. As the body tries to fight the infection, pus collects in the area and creates pressure under the skin. This pressure causes pain. People with weakened immune systems have difficulty fighting infections and get certain abscesses more often.  SYMPTOMS Usually an abscess develops on the skin and becomes a painful mass that is red, warm, and tender. If the abscess forms under the skin, you may feel a moveable soft area under the skin. Some abscesses break open (rupture) on their own, but most will continue to get worse without care. The infection can spread deeper into the body and eventually into the bloodstream, causing you to feel ill.  DIAGNOSIS  Your caregiver will take your medical history and perform a physical exam. A sample of fluid may also be taken from the abscess to determine what is causing your infection. TREATMENT  Your caregiver may prescribe antibiotic medicines to fight the infection. However, taking antibiotics alone usually does not cure an abscess. Your caregiver may need to make a small cut (incision) in the abscess to drain the pus. In some cases, gauze is packed into the abscess to reduce pain and to continue draining the area. HOME CARE INSTRUCTIONS   Only take over-the-counter or prescription medicines for pain, discomfort, or fever as directed by your caregiver.  If you were prescribed antibiotics, take them as directed. Finish them even if you start to feel better.  If gauze is used, follow your caregiver's directions for changing the gauze.  To avoid spreading the infection:  Keep your draining abscess covered with a  bandage.  Wash your hands well.  Do not share personal care items, towels, or whirlpools with others.  Avoid skin contact with others.  Keep your skin and clothes clean around the abscess.  Keep all follow-up appointments as directed by your caregiver. SEEK MEDICAL CARE IF:   You have increased pain, swelling, redness, fluid drainage, or bleeding.  You have muscle aches, chills, or a general ill feeling.  You have a fever. MAKE SURE YOU:   Understand these instructions.  Will watch your condition.  Will get help right away if you are not doing well or get worse. Document Released: 07/08/2005 Document Revised: 03/29/2012 Document Reviewed: 12/11/2011 [facility] Patient Information 2015 [place], [place]. This information is not intended to replace advice given to you by your health care provider. Make sure you discuss any questions you have with your health care provider.  Incision and Drainage Incision and drainage is a procedure in which a sac-like structure (cystic structure) is opened and drained. The area to be drained usually contains material such as pus, fluid, or blood.  LET YOUR CAREGIVER KNOW ABOUT:   Allergies to medicine.  Medicines taken, including vitamins, herbs, eyedrops, over-the-counter medicines, and creams.  Use of steroids (by mouth or creams).  Previous problems with anesthetics or numbing medicines.  History of bleeding problems or blood clots.  Previous surgery.  Other health problems, including diabetes and kidney problems.  Possibility of pregnancy, if this applies. RISKS AND COMPLICATIONS  Pain.  Bleeding.  Scarring.  Infection. BEFORE THE PROCEDURE  You may need to have  an ultrasound or other imaging tests to see how large or deep your cystic structure is. Blood tests may also be used to determine if you have an infection or how severe the infection is. You may need to have a tetanus shot. PROCEDURE  The affected area is cleaned with  a cleaning fluid. The cyst area will then be numbed with a medicine (local anesthetic). A small incision will be made in the cystic structure. A syringe or catheter may be used to drain the contents of the cystic structure, or the contents may be squeezed out. The area will then be flushed with a cleansing solution. After cleansing the area, it is often gently packed with a gauze or another wound dressing. Once it is packed, it will be covered with gauze and tape or some other type of wound dressing. AFTER THE PROCEDURE   Often, you will be allowed to go home right after the procedure.  You may be given antibiotic medicine to prevent or heal an infection.  If the area was packed with gauze or some other wound dressing, you will likely need to come back in 1 to 2 days to get it removed.  The area should heal in about 14 days. Document Released: 03/24/2001 Document Revised: 03/29/2012 Document Reviewed: 11/23/2011 [facility] Patient Information 2015 [place], [place]. This information is not intended to replace advice given to you by your health care provider. Make sure you discuss any questions you have with your health care provider.  Take the antibiotic as directed until completely gone. Apply warm compresses to the area to promote healing. Follow-up in 3 days for wound check and packing removal.

## 2015-07-08 NOTE — ED Notes (Signed)
Was senn last week for abscess area to back of neck area is larger now and more painful

## 2015-09-06 ENCOUNTER — Encounter: Payer: Self-pay | Admitting: Emergency Medicine

## 2015-09-06 ENCOUNTER — Emergency Department
Admission: EM | Admit: 2015-09-06 | Discharge: 2015-09-06 | Disposition: A | Discharge status: Home / Self Care | Payer: Self-pay | Attending: Emergency Medicine | Admitting: Emergency Medicine

## 2015-09-06 DIAGNOSIS — E119 Type 2 diabetes mellitus without complications: Secondary | ICD-10-CM | POA: Insufficient documentation

## 2015-09-06 DIAGNOSIS — L0211 Cutaneous abscess of neck: Secondary | ICD-10-CM | POA: Insufficient documentation

## 2015-09-06 DIAGNOSIS — R030 Elevated blood-pressure reading, without diagnosis of hypertension: Secondary | ICD-10-CM

## 2015-09-06 DIAGNOSIS — I10 Essential (primary) hypertension: Secondary | ICD-10-CM | POA: Insufficient documentation

## 2015-09-06 DIAGNOSIS — Z79899 Other long term (current) drug therapy: Secondary | ICD-10-CM | POA: Insufficient documentation

## 2015-09-06 DIAGNOSIS — Z792 Long term (current) use of antibiotics: Secondary | ICD-10-CM | POA: Insufficient documentation

## 2015-09-06 DIAGNOSIS — F17210 Nicotine dependence, cigarettes, uncomplicated: Secondary | ICD-10-CM | POA: Insufficient documentation

## 2015-09-06 LAB — GLUCOSE, CAPILLARY: Glucose-Capillary: 113 mg/dL — ABNORMAL HIGH (ref 65–99)

## 2015-09-06 MED ORDER — HYDROCODONE-ACETAMINOPHEN 5-325 MG PO TABS
1.0000 | ORAL_TABLET | Freq: Once | ORAL | Status: AC
  Administered 2015-09-06: 1 via ORAL
  Filled 2015-09-06: qty 1

## 2015-09-06 MED ORDER — HYDROCODONE-ACETAMINOPHEN 5-325 MG PO TABS: 1.0000 | ORAL_TABLET | ORAL | Status: DC | PRN

## 2015-09-06 MED ORDER — LIDOCAINE HCL (PF) 1 % IJ SOLN
5.0000 mL | Freq: Once | INTRAMUSCULAR | Status: AC
Start: 2015-09-06 — End: 2015-09-06
  Administered 2015-09-06: 5 mL
  Filled 2015-09-06: qty 5

## 2015-09-06 MED ORDER — SULFAMETHOXAZOLE-TRIMETHOPRIM 800-160 MG PO TABS: 1.0000 | ORAL_TABLET | Freq: Two times a day (BID) | ORAL | Status: DC

## 2015-09-06 NOTE — Discharge Instructions (Signed)
Abscess An abscess (boil or furuncle) is an infected area on or under the skin. This area is filled with yellowish-white fluid (pus) and other material (debris). HOME CARE   Only take medicines as told by your doctor.  If you were given antibiotic medicine, take it as directed. Finish the medicine even if you start to feel better.  If gauze is used, follow your doctor's directions for changing the gauze.  To avoid spreading the infection:  Keep your abscess covered with a bandage.  Wash your hands well.  Do not share personal care items, towels, or whirlpools with others.  Avoid skin contact with others.  Keep your skin and clothes clean around the abscess.  Keep all doctor visits as told. GET HELP RIGHT AWAY IF:   You have more pain, puffiness (swelling), or redness in the wound site.  You have more fluid or blood coming from the wound site.  You have muscle aches, chills, or you feel sick.  You have a fever. MAKE SURE YOU:   Understand these instructions.  Will watch your condition.  Will get help right away if you are not doing well or get worse.   This information is not intended to replace advice given to you by your health care provider. Make sure you discuss any questions you have with your health care provider.   Document Released: 03/16/2008 Document Revised: 03/29/2012 Document Reviewed: 12/12/2011 [employer] Interactive Patient Education [employer]    Begin taking antibiotics a day for the next 10 days. Pain medication only as directed. Return in 2 days for removal of packing. You must take your blood pressure medicine every day as well as your diabetes medicine. We will retake your blood pressure in 2 days when you return. It is extremely important for you to obtain a primary care doctor to manage your blood pressure. With your blood present sure not controlled this increases nor chances for heart disease and stroke

## 2015-09-06 NOTE — ED Notes (Signed)
Wound dressed with 4x4 and held with tape.

## 2015-09-06 NOTE — ED Notes (Signed)
E-signature pad not working. Pt unable to sign.

## 2015-09-06 NOTE — ED Notes (Signed)
Pt verbalized understanding of discharge instructions. NAD at this time. 

## 2015-09-06 NOTE — ED Provider Notes (Signed)
[facility] Emergency Department Provider Note  ____________________________________________  Time seen: Approximately 10:32 AM  I have reviewed the triage vital signs and the nursing notes.   HISTORY  Chief Complaint Abscess  HPI Ross Schmidt is a 29 y.o. male comes to the emergency room today with complaint of abscess on the base of his neck for approximate 5 days. He states that overnight it is increased in size and has been difficult to sleep for the last 4 nights. He has not taken any over-the-counter medication for this. He denies any fever or chills, nausea vomiting. He states he has not had any abscesses in the past.Currently he complains of pain that is constant, nonradiating and is a 10 out of 10.  Patient also states he has not taken his blood pressure medicine and approximate 3 days. He states that he does not have a primary care doctor and gets his blood pressure medicine and his metformin from ER doctors. He is unaware of what his blood pressure usually runs. He denies any symptoms such as headache, vision changes, chest pain or shortness of breath.   Past Medical History  Diagnosis Date  . Obesity   . Sleep apnea   . Hypertension   . Diabetes mellitus without complication (HCC)     There are no active problems to display for this patient.   Past Surgical History  Procedure Laterality Date  . Tonsillectomy      Current Outpatient Rx  Name  Route  Sig  Dispense  Refill  . clindamycin (CLEOCIN) 300 MG capsule   Oral   Take 1 capsule (300 mg total) by mouth 4 (four) times daily.   40 capsule   0   . hydrochlorothiazide (HYDRODIURIL) 25 MG tablet   Oral   Take 1 tablet (25 mg total) by mouth daily.   30 tablet   1   . HYDROcodone-acetaminophen (NORCO/VICODIN) 5-325 MG tablet   Oral   Take 1 tablet by mouth every 4 (four) hours as needed for moderate pain.   20 tablet   0   . ibuprofen (ADVIL,MOTRIN) 800 MG tablet   Oral  Take 1 tablet (800 mg total) by mouth every 8 (eight) hours as needed for moderate pain.   15 tablet   0   . metFORMIN (GLUCOPHAGE) 500 MG tablet   Oral   Take 1 tablet (500 mg total) by mouth 2 (two) times daily with a meal.   60 tablet   11   . predniSONE (DELTASONE) 10 MG tablet   Oral   Take 1 tablet (10 mg total) by mouth daily. 60 mg today, then decrease by 10 mg daily until gone.   21 tablet   0   . sulfamethoxazole-trimethoprim (BACTRIM DS,SEPTRA DS) 800-160 MG tablet   Oral   Take 1 tablet by mouth 2 (two) times daily.   20 tablet   0   . trimethoprim-polymyxin b (POLYTRIM) ophthalmic solution   Right Eye   Place 2 drops into the right eye every 4 (four) hours.   10 mL   0   . valACYclovir (VALTREX) 1000 MG tablet   Oral   Take 1 tablet (1,000 mg total) by mouth 3 (three) times daily.   21 tablet   0     Allergies Review of patient's allergies indicates no known allergies.  No family history on file.  Social History Social History  Substance Use Topics  . Smoking status: Current Every Day Smoker --  0.25 packs/day    Types: Cigarettes  . Smokeless tobacco: Not on file  . Alcohol Use: No    Review of Systems Constitutional: No fever/chills Eyes: No visual changes. Cardiovascular: Denies chest pain. Respiratory: Denies shortness of breath. Gastrointestinal:   No nausea, no vomiting.  Musculoskeletal: Negative for back pain. Skin: Negative for rash. Positive for abscess Neurological: Negative for headaches, focal weakness or numbness.  10-point ROS otherwise negative.  ____________________________________________   PHYSICAL EXAM:  VITAL SIGNS: ED Triage Vitals  Enc Vitals Group     BP 09/06/15 1021 172/127 mmHg     Pulse Rate 09/06/15 1021 94     Resp 09/06/15 1021 18     Temp 09/06/15 1021 97.9 F (36.6 C)     Temp Source 09/06/15 1021 Oral     SpO2 09/06/15 1021 95 %     Weight 09/06/15 1021 330 lb (149.687 kg)     Height 09/06/15  1021 5\' 10"  (1.778 m)     Head Cir --      Peak Flow --      Pain Score 09/06/15 1022 10     Pain Loc --      Pain Edu? --      Excl. in GC? --     Constitutional: Alert and oriented. Well appearing and in no acute distress. Obese. Eyes: Conjunctivae are normal. PERRL. EOMI. Head: Atraumatic. Nose: No congestion/rhinnorhea. Neck: No stridor.  Range of motion without restriction. Cardiovascular: Normal rate, regular rhythm. Grossly normal heart sounds.  Good peripheral circulation. Respiratory: Normal respiratory effort.  No retractions. Lungs CTAB. Gastrointestinal: Soft and nontender. No distention.  Musculoskeletal: No lower extremity tenderness nor edema.  No joint effusions. Neurologic:  Normal speech and language. No gross focal neurologic deficits are appreciated. No gait instability. Skin:  Skin is warm, dry and intact. No rash noted. Left lateral neck at the base with large erythematous abscess. Moderate tenderness on palpation. No active drainage was seen. Psychiatric: Mood and affect are normal. Speech and behavior are normal.  ____________________________________________   LABS (all labs ordered are listed, but only abnormal results are displayed)  Labs Reviewed  GLUCOSE, CAPILLARY - Abnormal; Notable for the following:    Glucose-Capillary 113 (*)    All other components within normal limits  CBG MONITORING, ED    PROCEDURES  Procedure(s) performed: INCISION AND DRAINAGE Performed by: Tommi Rumps Consent: Verbal consent obtained. Risks and benefits: risks, benefits and alternatives were discussed Type: abscess  Body area: Left lateral neck.  Anesthesia: local infiltration  Incision was made with a scalpel.  Local anesthetic: lidocaine 1 % without epinephrine  Anesthetic total: 2 ml  Complexity: complex Blunt dissection to break up loculations  Drainage: purulent  Drainage amount: Moderate   Packing material: 1/4 in iodoform gauze  Patient  tolerance: Patient tolerated the procedure well with no immediate complications.    Critical Care performed: No  ____________________________________________   INITIAL IMPRESSION / ASSESSMENT AND PLAN / ED COURSE  Pertinent labs & imaging results that were available during my care of the patient were reviewed by me and considered in my medical decision making (see chart for details).  Patient was started on Bactrim DS twice a day for 10 days along with Norco as needed for pain. Patient is return in 2 days for packing removal. He is return to also for blood pressure rechecked. We discussed at length the importance of control of both his diabetes and his blood pressure. He is  encouraged strongly to obtain a primary care doctor. Currently he is taking his medication infrequently and has no idea how controlled either his blood pressure or diabetes is. We discussed risk of stroke and heart disease. He states he has blood pressure medication and metformin at home and he is to begin taking both. He was given a list of all clinics in the area that had a sliding scale and is strongly encouraged to call on Monday to get an appointment. ____________________________________________   FINAL CLINICAL IMPRESSION(S) / ED DIAGNOSES  Final diagnoses:  Abscess of neck  Elevated blood pressure reading      Tommi Rumps, PA-C 09/06/15 1329  Phineas Semen, MD 09/06/15 1359

## 2015-09-06 NOTE — ED Notes (Signed)
Pt reports last Sunday abscess to back of the neck started as a small bump and increased overnight.  Pt reports difficulty sleeping for past 4 nights.

## 2016-08-11 ENCOUNTER — Encounter: Payer: Self-pay | Admitting: Family Medicine

## 2016-08-11 ENCOUNTER — Ambulatory Visit (INDEPENDENT_AMBULATORY_CARE_PROVIDER_SITE_OTHER): Payer: Medicaid Other | Admitting: Family Medicine

## 2016-08-11 VITALS — BP 168/110 | HR 94 | Temp 98.8°F | Resp 20 | Ht 70.0 in | Wt 381.0 lb

## 2016-08-11 DIAGNOSIS — I5042 Chronic combined systolic (congestive) and diastolic (congestive) heart failure: Secondary | ICD-10-CM

## 2016-08-11 DIAGNOSIS — L723 Sebaceous cyst: Secondary | ICD-10-CM

## 2016-08-11 DIAGNOSIS — E1169 Type 2 diabetes mellitus with other specified complication: Secondary | ICD-10-CM

## 2016-08-11 DIAGNOSIS — E785 Hyperlipidemia, unspecified: Secondary | ICD-10-CM | POA: Insufficient documentation

## 2016-08-11 DIAGNOSIS — E669 Obesity, unspecified: Secondary | ICD-10-CM

## 2016-08-11 DIAGNOSIS — L089 Local infection of the skin and subcutaneous tissue, unspecified: Secondary | ICD-10-CM

## 2016-08-11 DIAGNOSIS — I509 Heart failure, unspecified: Secondary | ICD-10-CM | POA: Insufficient documentation

## 2016-08-11 DIAGNOSIS — Z91199 Patient's noncompliance with other medical treatment and regimen due to unspecified reason: Secondary | ICD-10-CM | POA: Insufficient documentation

## 2016-08-11 DIAGNOSIS — Z9981 Dependence on supplemental oxygen: Secondary | ICD-10-CM

## 2016-08-11 DIAGNOSIS — I272 Pulmonary hypertension, unspecified: Secondary | ICD-10-CM | POA: Insufficient documentation

## 2016-08-11 DIAGNOSIS — J989 Respiratory disorder, unspecified: Secondary | ICD-10-CM

## 2016-08-11 DIAGNOSIS — Z9119 Patient's noncompliance with other medical treatment and regimen: Secondary | ICD-10-CM

## 2016-08-11 DIAGNOSIS — Z7689 Persons encountering health services in other specified circumstances: Secondary | ICD-10-CM

## 2016-08-11 DIAGNOSIS — G4733 Obstructive sleep apnea (adult) (pediatric): Secondary | ICD-10-CM

## 2016-08-11 DIAGNOSIS — I10 Essential (primary) hypertension: Secondary | ICD-10-CM | POA: Insufficient documentation

## 2016-08-11 HISTORY — DX: Hyperlipidemia, unspecified: E78.5

## 2016-08-11 NOTE — Patient Instructions (Addendum)
Need old records Come back in 2 weeks/ for 40 min appt BRING ALL MEDICINE WITH YOU  Continue salt restriction Continue water restriction Weigh yourself daily  You MUST take your medicine

## 2016-08-11 NOTE — Progress Notes (Addendum)
Chief Complaint  Patient presents with  . Establish Care   New patient here to establish 30 year old gentleman who lives with his mother. Previously cared for in the [facility] area. His insurance changed and he needs to establish care in [place]. Unfortunate gentleman with multiple severe medical problems, he is a poor historian does not know the medications he is supposed to be taking, did not bring medications with him. His mother brings limited old records in the form of office notes from his primary care doctor from April through June of this year. He is currently noncompliant with his medications and treatment, causing hazard to his health and this is discussed with him. He has severe hypertension is not taking his medication. Blood pressure is elevated today. He has diabetes. He is not taking metformin. He is not following a diabetic diet. He has congestive heart failure. He is not on a low-salt diet. He is not weighing himself daily. He is marginally restricting fluids. He has obstructive sleep apnea and hypoxic respiratory failure ,  severe pulmonary hypertension. 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 He is scheduled for sleep study but does not yet have treatment for his sleep apnea. He is morbidly obese. He has gained 50 pounds in the last year. He is unable to exercise because of his immobility due to shortness of breath and heart condition. His biggest complaint is an infected sebaceous cyst on the back of his neck. He states it is severely painful. He is requesting referral for treatment and pain medication. He is advised to take ibuprofen 800 mg 3 times a day for pain and use warm compresses. He wants a refill of prescription for hydrocodone 10 mg. I explained to him that I will not be giving him narcotics.   Patient Active Problem List   Diagnosis Date Noted  . Extremely obese with respiratory disorder (HCC) 08/11/2016  . Diabetes mellitus type 2 in obese (HCC)  08/11/2016  . Pulmonary HTN 08/11/2016  . HTN (hypertension) 08/11/2016  . HLD (hyperlipidemia) 08/11/2016  . Noncompliance 08/11/2016  . OSA (obstructive sleep apnea) 08/11/2016  . CHF (congestive heart failure) (HCC) 08/11/2016  . Oxygen dependent 08/11/2016  . Infected sebaceous cyst 08/11/2016    Outpatient Encounter Prescriptions as of 08/11/2016  Medication Sig  . amLODipine (NORVASC) 10 MG tablet Take 10 mg by mouth daily.  Marland Kitchen atorvastatin (LIPITOR) 40 MG tablet Take 40 mg by mouth daily.  . carvedilol (COREG) 25 MG tablet Take 25 mg by mouth 2 (two) times daily with a meal.  . hydrALAZINE (APRESOLINE) 100 MG tablet Take 100 mg by mouth 3 (three) times daily.  . hydrochlorothiazide (HYDRODIURIL) 25 MG tablet Take 1 tablet (25 mg total) by mouth daily. (Patient not taking: Reported on 08/11/2016)  . ibuprofen (ADVIL,MOTRIN) 800 MG tablet Take 1 tablet (800 mg total) by mouth every 8 (eight) hours as needed for moderate pain. (Patient not taking: Reported on 08/11/2016)  . metFORMIN (GLUCOPHAGE) 500 MG tablet Take 1 tablet (500 mg total) by mouth 2 (two) times daily with a meal. (Patient not taking: Reported on 08/11/2016)   No facility-administered encounter medications on file as of 08/11/2016.     Past Medical History:  Diagnosis Date  . Asthma   . CHF (congestive heart failure) (HCC)   . COPD (chronic obstructive pulmonary disease) (HCC)   . Diabetes mellitus without complication (HCC)   . HLD (hyperlipidemia) 08/11/2016  . Hypertension   . Obesity   .  Oxygen deficiency   . Premature baby   . Sleep apnea     Past Surgical History:  Procedure Laterality Date  . TONSILLECTOMY      Social History   Social History  . Marital status: Single    Spouse name: N/A  . Number of children: 0  . Years of education: 10   Occupational History  .      disability   Social History Main Topics  . Smoking status: Former Smoker    Packs/day: 0.25    Types: Cigarettes     Start date: 10/12/1997    Quit date: 02/10/2016  . Smokeless tobacco: Never Used  . Alcohol use No  . Drug use: No  . Sexual activity: Not Currently   Other Topics Concern  . Not on file   Social History Narrative   Lives with mother and step father   disabled    Family History  Problem Relation Age of Onset  . Diabetes Maternal Uncle   . Heart disease Maternal Uncle   . Diabetes Maternal Grandmother   . Hypertension Maternal Grandmother   . Miscarriages / Stillbirths Maternal Grandfather    Review of Systems  Constitutional: Positive for malaise/fatigue. Negative for chills, fever and weight loss.       Unintentional weight gain  HENT: Negative for congestion and hearing loss.   Eyes: Negative for blurred vision and pain.       No recent eye visit  Respiratory: Positive for shortness of breath. Negative for cough and wheezing.   Cardiovascular: Positive for leg swelling. Negative for chest pain.  Gastrointestinal: Negative for abdominal pain, constipation, diarrhea and heartburn.  Genitourinary: Negative for dysuria and frequency.  Musculoskeletal: Positive for neck pain. Negative for falls, joint pain and myalgias.       Neck pain from skin cyst. No orthopedic complaint  Neurological: Negative for dizziness, seizures and headaches.  Psychiatric/Behavioral: Negative for depression. The patient is not nervous/anxious and does not have insomnia.     BP (!) 168/110 (BP Location: Right Arm, Patient Position: Sitting, Cuff Size: Large)   Pulse 94   Temp 98.8 F (37.1 C) (Oral)   Resp 20   Ht 5\' 10"  (1.778 m)   Wt (!) 381 lb 0.6 oz (172.8 kg)   SpO2 97% Comment: o2 4 lpm via n/c  BMI 54.67 kg/m   Physical Exam  Constitutional: He appears well-developed.  Overnourished. Morbidly obese. On oxygen.  HENT:  Head: Normocephalic and atraumatic.  Mouth/Throat: Oropharynx is clear and moist.  Needs dental repair  Eyes: Conjunctivae are normal. Pupils are equal, round, and  reactive to light.  Eyelids puffy  Neck: Normal range of motion.  On the back of neck there is a 8 cm x 4 cm indurated mass, skin is closed, no drainage. Moderately tender. No fluctuance  Cardiovascular: Regular rhythm and normal heart sounds.   Pulmonary/Chest:  Diminished breath sounds  Abdominal:  Obese, soft, bowel sounds active  Musculoskeletal: He exhibits no edema.  No pedal edema  Lymphadenopathy:    He has no cervical adenopathy.  Psychiatric: He has a normal mood and affect. His behavior is normal.   ASSESSMENT/PLAN:  1. Extremely obese with respiratory disorder (HCC) Discussed need for diet and increase activity  2. Diabetes mellitus type 2 in obese [facility]) Discussed importance of taking metformin twice a day  3. Pulmonary HTN On supplemental oxygen  4. Essential hypertension Essentially untreated. I emphasized the importance of taking medication daily.  5. Hyperlipidemia, unspecified hyperlipidemia type On Lipitor  6. Noncompliance I explained to the patient that by not taking his medication he was placing himself at significant risk of morbidity and mortality. I suggested he set an alarm on his Smart phone 3 times a day to remember to take his medication appropriately.  I discussed with patient and his mother had some sort of organized pillbox to help  7. OSA (obstructive sleep apnea) Sleep study pending  8. Chronic combined systolic and diastolic congestive heart failure (HCC) By history  9. Oxygen dependent On oxygen  10. Infected sebaceous cyst Discussed ibuprofen for pain. The patient has a prescription for Keflex to take. Warm compresses. - Ambulatory referral to General Surgery   Patient Instructions  Need old records Come back in 2 weeks/ for 40 min appt BRING ALL MEDICINE WITH YOU  Continue salt restriction Continue water restriction Weigh yourself daily  You MUST take your medicine   Eustace MooreYvonne Sue Katrina Daddona, MD

## 2016-08-25 ENCOUNTER — Encounter: Payer: Self-pay | Admitting: Family Medicine

## 2016-08-25 ENCOUNTER — Ambulatory Visit (INDEPENDENT_AMBULATORY_CARE_PROVIDER_SITE_OTHER): Payer: Medicaid Other | Admitting: Family Medicine

## 2016-08-25 VITALS — BP 148/88 | HR 100 | Temp 99.3°F | Resp 24 | Ht 71.0 in | Wt 385.0 lb

## 2016-08-25 DIAGNOSIS — I10 Essential (primary) hypertension: Secondary | ICD-10-CM | POA: Diagnosis not present

## 2016-08-25 DIAGNOSIS — I272 Pulmonary hypertension, unspecified: Secondary | ICD-10-CM

## 2016-08-25 DIAGNOSIS — E785 Hyperlipidemia, unspecified: Secondary | ICD-10-CM

## 2016-08-25 DIAGNOSIS — Z9119 Patient's noncompliance with other medical treatment and regimen: Secondary | ICD-10-CM

## 2016-08-25 DIAGNOSIS — E669 Obesity, unspecified: Secondary | ICD-10-CM

## 2016-08-25 DIAGNOSIS — E1169 Type 2 diabetes mellitus with other specified complication: Secondary | ICD-10-CM | POA: Diagnosis not present

## 2016-08-25 DIAGNOSIS — Z23 Encounter for immunization: Secondary | ICD-10-CM | POA: Diagnosis not present

## 2016-08-25 DIAGNOSIS — Z91199 Patient's noncompliance with other medical treatment and regimen due to unspecified reason: Secondary | ICD-10-CM

## 2016-08-25 MED ORDER — CARVEDILOL 25 MG PO TABS: 25.0000 mg | ORAL_TABLET | Freq: Two times a day (BID) | ORAL | 1 refills | Status: DC

## 2016-08-25 MED ORDER — HYDROCHLOROTHIAZIDE 25 MG PO TABS: 25.0000 mg | ORAL_TABLET | Freq: Every day | ORAL | 1 refills | Status: DC

## 2016-08-25 MED ORDER — BLOOD GLUCOSE MONITOR KIT: PACK | 0 refills | Status: DC

## 2016-08-25 MED ORDER — ATORVASTATIN CALCIUM 40 MG PO TABS: 40.0000 mg | ORAL_TABLET | Freq: Every day | ORAL | 1 refills | Status: DC

## 2016-08-25 NOTE — Patient Instructions (Addendum)
Take the hydralazine 3 times a day every day for your blood pressure Take the carvedilol twice a day for your heart and your blood pressure Take hydrochlorothiazide once a day in the morning for your fluid and your blood pressure Take the metformin twice a day with breakfast and dinner for your diabetes Check your sugar every morning before breakfast and write it down Take the atorvastatin every evening for your cholesterol and to prevent heart attack and stroke  I have referred you to a diabetes educator Take Mom with you I have referred you to a diabetes specialist  See the heart doctor as scheduled  I am getting lab tests on you today I will send you a letter with your test results.  If there is anything of concern, we will call right away. See me in a month

## 2016-08-25 NOTE — Progress Notes (Signed)
Chief Complaint  Patient presents with  . Follow-up    2 week   Continues noncompliant Has gained weight\BP is up Has not taken any medicine today Didn't bring his pills in  Discussed with patient and his mother that he needs to take better care of himself or risk serious consequence morbility or mortality.  Reviewed medicines Reviewed referrals He sees cardiology next week He sees surgery this week He is referred to DM management and endocrinology He has a sleep study scheduled for the 20th this month   Patient Active Problem List   Diagnosis Date Noted  . Extremely obese with respiratory disorder ([place]) 08/11/2016  . Diabetes mellitus type 2 in obese ([place]) 08/11/2016  . Pulmonary HTN 08/11/2016  . HTN (hypertension) 08/11/2016  . HLD (hyperlipidemia) 08/11/2016  . Personal history of noncompliance with medical treatment, presenting hazards to health 08/11/2016  . OSA (obstructive sleep apnea) 08/11/2016  . CHF (congestive heart failure) ([place]) 08/11/2016  . Oxygen dependent 08/11/2016  . Infected sebaceous cyst 08/11/2016    Outpatient Encounter Prescriptions as of 08/25/2016  Medication Sig  . hydrALAZINE (APRESOLINE) 100 MG tablet Take 100 mg by mouth 3 (three) times daily.  Marland Kitchen ibuprofen (ADVIL,MOTRIN) 800 MG tablet Take 1 tablet (800 mg total) by mouth every 8 (eight) hours as needed for moderate pain.  . metFORMIN (GLUCOPHAGE) 500 MG tablet Take 1 tablet (500 mg total) by mouth 2 (two) times daily with a meal.  . atorvastatin (LIPITOR) 40 MG tablet Take 1 tablet (40 mg total) by mouth daily.  . blood glucose meter kit and supplies KIT Dispense based on patient and insurance preference. Use up to four times daily as directed. (FOR ICD-9 250.00, 250.01).  . carvedilol (COREG) 25 MG tablet Take 1 tablet (25 mg total) by mouth 2 (two) times daily with a meal.  . hydrochlorothiazide (HYDRODIURIL) 25 MG tablet Take 1 tablet (25 mg total) by mouth daily.   No  facility-administered encounter medications on file as of 08/25/2016.     No Known Allergies  Review of Systems  Constitutional: Positive for unexpected weight change. Negative for chills and fever.       Gain  HENT: Negative for congestion and dental problem.   Eyes: Negative for redness and visual disturbance.  Respiratory: Positive for shortness of breath. Negative for cough.        Chronic dyspnea  Cardiovascular: Negative for chest pain, palpitations and leg swelling.  Gastrointestinal: Negative for constipation and diarrhea.  Genitourinary: Positive for frequency. Negative for difficulty urinating.  Musculoskeletal: Positive for back pain and gait problem.  Neurological: Negative for dizziness and headaches.  Psychiatric/Behavioral: Positive for sleep disturbance. Negative for dysphoric mood.    BP (!) 148/88   Pulse 100   Temp 99.3 F (37.4 C) (Oral)   Resp (!) 24   Ht '5\' 11"'  (1.803 m)   Wt (!) 385 lb (174.6 kg)   SpO2 97% Comment: o2 4 lpm via n/c  BMI 53.70 kg/m   Physical Exam  Constitutional: He appears well-developed and well-nourished. No distress.  Obese.  On oxygen  HENT:  Head: Normocephalic and atraumatic.  Mouth/Throat: Oropharynx is clear and moist.  Eyes: Conjunctivae are normal. Pupils are equal, round, and reactive to light.  Neck: Normal range of motion.  Cyst on back of neck is palpable but not tender or infected  Cardiovascular: Regular rhythm and normal heart sounds.   Pulmonary/Chest: Effort normal. He has rales.  Decreased Bs, faint  rales at bases  Abdominal: Soft. Bowel sounds are normal.  rotund  Musculoskeletal: He exhibits no edema.  No pitting  Lymphadenopathy:    He has no cervical adenopathy.  Psychiatric: He has a normal mood and affect. His behavior is normal.    ASSESSMENT/PLAN:  1. Diabetes mellitus type 2 in obese [facility])  Discussed importance of restricting the carbohydrates in diet - COMPLETE METABOLIC PANEL WITH GFR -  Hemoglobin A1c - Lipid panel - CBC - Urine Microalbumin w/creat. ratio - Ambulatory referral to diabetic education - Ambulatory referral to Endocrinology  2. Pulmonary HTN On oxygen 3. Essential hypertension uncontrolled 4. Hyperlipidemia, unspecified hyperlipidemia type  5. Noncompliance Serious risk to health  6. Need for prophylactic vaccination and inoculation against influenza  - Flu Vaccine QUAD 36+ mos IM   Patient Instructions  Take the hydralazine 3 times a day every day for your blood pressure Take the carvedilol twice a day for your heart and your blood pressure Take hydrochlorothiazide once a day in the morning for your fluid and your blood pressure Take the metformin twice a day with breakfast and dinner for your diabetes Check your sugar every morning before breakfast and write it down Take the atorvastatin every evening for your cholesterol and to prevent heart attack and stroke  I have referred you to a diabetes educator Take Mom with you I have referred you to a diabetes specialist  See the heart doctor as scheduled  I am getting lab tests on you today I will send you a letter with your test results.  If there is anything of concern, we will call right away. See me in a month    Raylene Everts, MD

## 2016-08-26 ENCOUNTER — Other Ambulatory Visit: Payer: Self-pay | Admitting: Family Medicine

## 2016-08-26 MED ORDER — BLOOD GLUCOSE MONITOR KIT: PACK | 0 refills | Status: DC

## 2016-08-26 MED ORDER — ATORVASTATIN CALCIUM 40 MG PO TABS: 40.0000 mg | ORAL_TABLET | Freq: Every day | ORAL | 1 refills | Status: DC

## 2016-08-26 MED ORDER — HYDROCHLOROTHIAZIDE 25 MG PO TABS: 25.0000 mg | ORAL_TABLET | Freq: Every day | ORAL | 1 refills | Status: DC

## 2016-08-26 MED ORDER — CARVEDILOL 25 MG PO TABS: 25.0000 mg | ORAL_TABLET | Freq: Two times a day (BID) | ORAL | 1 refills | Status: DC

## 2016-09-02 ENCOUNTER — Telehealth: Payer: Self-pay | Admitting: Family Medicine

## 2016-09-02 NOTE — Telephone Encounter (Signed)
Opened in error

## 2016-09-10 ENCOUNTER — Telehealth: Payer: Self-pay | Admitting: Family Medicine

## 2016-09-10 NOTE — Telephone Encounter (Signed)
[name] mother Ross Schmidt is calling to inform Dr. Delton See that Ross Schmidt has been in [facility] and is expecting to have another sleep study preformed on Saturday Dec 2nd. Ross Schmidt will sign a release before leaving the hospital to release information to Dr. Delton See. Ross Schmidt states that he did receive his [employer] and will be following up with Cardiology on Monday December 4th.

## 2016-09-14 ENCOUNTER — Ambulatory Visit (INDEPENDENT_AMBULATORY_CARE_PROVIDER_SITE_OTHER): Payer: Medicaid Other | Admitting: Cardiovascular Disease

## 2016-09-14 ENCOUNTER — Encounter: Payer: Self-pay | Admitting: *Deleted

## 2016-09-14 ENCOUNTER — Telehealth: Payer: Self-pay

## 2016-09-14 ENCOUNTER — Encounter: Payer: Self-pay | Admitting: Cardiovascular Disease

## 2016-09-14 VITALS — BP 142/89 | HR 88 | Ht 71.0 in | Wt 394.2 lb

## 2016-09-14 DIAGNOSIS — R9431 Abnormal electrocardiogram [ECG] [EKG]: Secondary | ICD-10-CM

## 2016-09-14 DIAGNOSIS — I10 Essential (primary) hypertension: Secondary | ICD-10-CM | POA: Diagnosis not present

## 2016-09-14 DIAGNOSIS — J9611 Chronic respiratory failure with hypoxia: Secondary | ICD-10-CM

## 2016-09-14 DIAGNOSIS — N183 Chronic kidney disease, stage 3 unspecified: Secondary | ICD-10-CM

## 2016-09-14 DIAGNOSIS — I5022 Chronic systolic (congestive) heart failure: Secondary | ICD-10-CM

## 2016-09-14 DIAGNOSIS — Z9981 Dependence on supplemental oxygen: Secondary | ICD-10-CM

## 2016-09-14 DIAGNOSIS — Z9289 Personal history of other medical treatment: Secondary | ICD-10-CM

## 2016-09-14 DIAGNOSIS — G4733 Obstructive sleep apnea (adult) (pediatric): Secondary | ICD-10-CM | POA: Diagnosis not present

## 2016-09-14 MED ORDER — FUROSEMIDE 20 MG PO TABS: 20.0000 mg | ORAL_TABLET | Freq: Two times a day (BID) | ORAL | Status: DC

## 2016-09-14 NOTE — Patient Instructions (Addendum)
Medication Instructions:  Continue all current medications.  Labwork: none  Testing/Procedures: None  Referrals:  Pulmonology   Follow-Up: 2 months  Any Other Special Instructions Will Be Listed Below (If Applicable).  If you need a refill on your cardiac medications before your next appointment, please call your pharmacy.

## 2016-09-14 NOTE — Progress Notes (Addendum)
CARDIOLOGY CONSULT NOTE  Patient ID: Ross Schmidt MRN: [id number] DOB/AGE: [birth year] 31 y.o.  Admit date: (Not on file) Primary Physician: Raylene Everts, MD Referring Physician:   Reason for Consultation: shortness of breath, abnormal ECG  HPI: The patient is a 30 year old male with a history of hypertension, diabetes, chronic kidney disease, and sleep apnea. He was hospitalized for shortness of breath in May 2017. ECG provided shows heart rate 100 bpm, normal sinus rhythm, with ST segment and T-wave abnormalities in the inferolateral leads. Labs in May 2017: Total cholesterol 211, trig glycerides 214, HDL 30, LDL 138, BUN 24, creatinine 2.17, sodium 141, potassium 4.5.  He was apparently hospitalized for congestive heart failure last week at [facility]. I do not have any records. He was recently started on BiPAP for sleep apnea. He said the right side of his heart is enlarged and his ejection fraction is 35%. He has been on oxygen since May and uses 4 L chronically. He was just prescribed Lasix 40 mg twice daily but has yet to fill it.  He used to see Dr. Bing Quarry who is a cardiologist with [facility] in [place]. I do not have prior cardiology records either.  He currently denies chest pain and his chronic exertional dyspnea is at baseline. He denies leg swelling. He also denies paroxysmal nocturnal dyspnea.    Allergies  Allergen Reactions  . Morphine And Related     Heart beat fast   . Nicotine     Rash from the patch     Current Outpatient Prescriptions  Medication Sig Dispense Refill  . atorvastatin (LIPITOR) 40 MG tablet Take 1 tablet (40 mg total) by mouth daily. 30 tablet 1  . blood glucose meter kit and supplies KIT Dispense based on patient and insurance preference. Use up to four times daily as directed. (FOR ICD-9 250.00, 250.01). 1 each 0  . carvedilol (COREG) 25 MG tablet Take 1 tablet (25 mg total) by mouth 2 (two) times daily with a meal. 60 tablet 1    . hydrALAZINE (APRESOLINE) 100 MG tablet Take 100 mg by mouth 3 (three) times daily.    . hydrochlorothiazide (HYDRODIURIL) 25 MG tablet Take 1 tablet (25 mg total) by mouth daily. 30 tablet 1  . metFORMIN (GLUCOPHAGE) 1000 MG tablet Take 1,000 mg by mouth 2 (two) times daily.    . metFORMIN (GLUCOPHAGE) 500 MG tablet Take 1 tablet (500 mg total) by mouth 2 (two) times daily with a meal. 60 tablet 11   No current facility-administered medications for this visit.     Past Medical History:  Diagnosis Date  . Asthma   . CHF (congestive heart failure) ([place])   . COPD (chronic obstructive pulmonary disease) ([place])   . Diabetes mellitus without complication ([place])   . HLD (hyperlipidemia) 08/11/2016  . Hypertension   . Obesity   . Oxygen deficiency   . Premature baby   . Sleep apnea     Past Surgical History:  Procedure Laterality Date  . TONSILLECTOMY      Social History   Social History  . Marital status: Single    Spouse name: N/A  . Number of children: 0  . Years of education: 10   Occupational History  .      disability   Social History Main Topics  . Smoking status: Former Smoker    Packs/day: 0.25    Types: Cigarettes    Start date: 10/12/1997  Quit date: 02/10/2016  . Smokeless tobacco: Never Used  . Alcohol use No  . Drug use: No  . Sexual activity: Not Currently   Other Topics Concern  . Not on file   Social History Narrative   Lives with mother and step father   disabled     No family history of premature CAD in 1st degree relatives.  Prior to Admission medications   Medication Sig Start Date End Date Taking? Authorizing Provider  atorvastatin (LIPITOR) 40 MG tablet Take 1 tablet (40 mg total) by mouth daily. 08/26/16   Raylene Everts, MD  blood glucose meter kit and supplies KIT Dispense based on patient and insurance preference. Use up to four times daily as directed. (FOR ICD-9 250.00, 250.01). 08/26/16   Raylene Everts, MD  carvedilol  (COREG) 25 MG tablet Take 1 tablet (25 mg total) by mouth 2 (two) times daily with a meal. 08/26/16   Raylene Everts, MD  hydrALAZINE (APRESOLINE) 100 MG tablet Take 100 mg by mouth 3 (three) times daily.    Historical Provider, MD  hydrochlorothiazide (HYDRODIURIL) 25 MG tablet Take 1 tablet (25 mg total) by mouth daily. 08/26/16   Raylene Everts, MD  ibuprofen (ADVIL,MOTRIN) 800 MG tablet Take 1 tablet (800 mg total) by mouth every 8 (eight) hours as needed for moderate pain. 07/03/15   Sable Feil, PA-C  metFORMIN (GLUCOPHAGE) 500 MG tablet Take 1 tablet (500 mg total) by mouth 2 (two) times daily with a meal. 05/01/15 08/25/16  Earleen Newport, MD     Review of systems complete and found to be negative unless listed above in HPI     Physical exam Blood pressure (!) 142/89, pulse 88, height '5\' 11"'  (1.803 m), weight (!) 394 lb 3.2 oz (178.8 kg), SpO2 96 %. General: NAD Neck: Thick neck. Unable to assess JVP.  Lungs: Diminished throughout, no rales or wheezes. CV: Nondisplaced PMI. Regular rate and rhythm, normal S1/S2, no S3/S4, no murmur.  Brawny trace pretibial edema b/l.  Abdomen: Morbidly obese. Neurologic: Alert and oriented x 3.  Psych: Normal affect. HEENT: Normal.   ECG: Most recent ECG reviewed.  Labs:   Lab Results  Component Value Date   WBC 11.9 (H) 07/08/2015   HGB 14.3 07/08/2015   HCT 45.5 07/08/2015   MCV 87.2 07/08/2015   PLT 248 07/08/2015   No results for input(s): NA, K, CL, CO2, BUN, CREATININE, CALCIUM, PROT, BILITOT, ALKPHOS, ALT, AST, GLUCOSE in the last 168 hours.  Invalid input(s): LABALBU Lab Results  Component Value Date   TROPONINI 0.03 05/01/2015   No results found for: CHOL No results found for: HDL No results found for: LDLCALC No results found for: TRIG No results found for: CHOLHDL No results found for: LDLDIRECT       Studies: No results found.  ASSESSMENT AND PLAN:  1. Abnormal ECG: Has multiple CV risk factors. I  will see if a stress test was performed. I will obtain records from [facility] and from his previous cardiologist.  2. Hypertension: Elevated. If this persists, would increase Coreg to 37.5 mg bid. Already on hydralazine 100 mg tid and HCTZ 25 mg.  3. CKD stage 3: Stable.  4. Sleep apnea: On Bipap.  5. Chronic systolic heart failure: He is reportedly on Lasix 40 mg twice daily. I will obtain cardiac imaging records from [facility] and from his prior cardiologist. No medication adjustments today.  5. Chronic hypoxic respiratory failure: Uses  4L O2 chronically since May. Will make pulmonary referral.   Dispo: fu 2 months  Signed: Kate Sable, M.D., F.A.C.C.  09/14/2016, 9:12 AM   ADDENDUM: Hospital records reviewed. Echocardiogram 09/08/16 showed normal left ventricular size, mild concentric LVH, LVEF 30-35%, normal right ventricular size and systolic function, severe left atrial dilatation, and no gross valvular pathology. It appears he was discharged on Lasix 20 mg daily and not 40 mg twice daily as per the discharge summary. Labs showed BUN 11, creatinine 0.82, sodium 144, potassium 3.9. Hemoglobin 11.8, platelets 257. Troponins were normal.

## 2016-09-15 NOTE — Telephone Encounter (Signed)
Need information from [facility]

## 2016-09-15 NOTE — Progress Notes (Signed)
This says he is on 4L of oxygen chronically.  Do not know if it is different at night

## 2016-09-18 ENCOUNTER — Telehealth: Payer: Self-pay

## 2016-09-18 DIAGNOSIS — R0902 Hypoxemia: Secondary | ICD-10-CM

## 2016-09-18 NOTE — Telephone Encounter (Signed)
done

## 2016-09-22 ENCOUNTER — Ambulatory Visit: Payer: Medicaid Other | Admitting: Nutrition

## 2016-09-24 ENCOUNTER — Ambulatory Visit (INDEPENDENT_AMBULATORY_CARE_PROVIDER_SITE_OTHER): Payer: Medicaid Other | Admitting: Family Medicine

## 2016-09-24 ENCOUNTER — Encounter: Payer: Self-pay | Admitting: Family Medicine

## 2016-09-24 VITALS — BP 170/110 | HR 88 | Temp 98.7°F | Resp 20 | Ht 71.0 in | Wt >= 6400 oz

## 2016-09-24 DIAGNOSIS — I272 Pulmonary hypertension, unspecified: Secondary | ICD-10-CM

## 2016-09-24 DIAGNOSIS — R0902 Hypoxemia: Secondary | ICD-10-CM | POA: Diagnosis not present

## 2016-09-24 DIAGNOSIS — E1169 Type 2 diabetes mellitus with other specified complication: Secondary | ICD-10-CM

## 2016-09-24 DIAGNOSIS — G4733 Obstructive sleep apnea (adult) (pediatric): Secondary | ICD-10-CM

## 2016-09-24 DIAGNOSIS — Z9981 Dependence on supplemental oxygen: Secondary | ICD-10-CM

## 2016-09-24 DIAGNOSIS — E669 Obesity, unspecified: Secondary | ICD-10-CM

## 2016-09-24 MED ORDER — FUROSEMIDE 20 MG PO TABS: 20.0000 mg | ORAL_TABLET | Freq: Two times a day (BID) | ORAL | 1 refills | Status: DC

## 2016-09-24 MED ORDER — HYDRALAZINE HCL 100 MG PO TABS: 100.0000 mg | ORAL_TABLET | Freq: Three times a day (TID) | ORAL | 1 refills | Status: DC

## 2016-09-24 MED ORDER — METFORMIN HCL 1000 MG PO TABS: 1000.0000 mg | ORAL_TABLET | Freq: Two times a day (BID) | ORAL | 1 refills | Status: DC

## 2016-09-24 MED ORDER — ATORVASTATIN CALCIUM 40 MG PO TABS: 40.0000 mg | ORAL_TABLET | Freq: Every day | ORAL | 1 refills | Status: DC

## 2016-09-24 MED ORDER — CARVEDILOL 25 MG PO TABS: 25.0000 mg | ORAL_TABLET | Freq: Two times a day (BID) | ORAL | 1 refills | Status: DC

## 2016-09-24 NOTE — Patient Instructions (Signed)
We have refilled all of your medicine  We are working on portable concentrator  Take your pills every day  Do your chair exercises twice a day every day  Work on losing weight   Continue to avoid salt  See me in a couple of months  Due for lab testing next time

## 2016-09-24 NOTE — Progress Notes (Signed)
Chief Complaint  Patient presents with  . Follow-up    1 month  routine follow up  Patient states that he feels better. He is using his BiPAP at night, is getting better sleep, and has more energy during the day. He states that he has started doing some exercises in his chair at his home to try to improve his physical condition. He states he is trying to follow a low-salt diet.  Unfortunately he is still noncompliant. He does not weigh himself daily. He does not taking his prescription medications. He has not had his Lasix. We are sending refills of all his medication to his pharmacy again today. He has not taken his blood pressure pill. His blood pressure is very high.  Since I have seen him last he had another hospitalization. Those records are not available, but have been requested today.  He needs referral to a local cardiologist. He was previously seen in [place].  I will place an order today.   Patient Active Problem List   Diagnosis Date Noted  . Extremely obese with respiratory disorder ([place]) 08/11/2016  . Diabetes mellitus type 2 in obese ([place]) 08/11/2016  . Pulmonary HTN 08/11/2016  . HTN (hypertension) 08/11/2016  . HLD (hyperlipidemia) 08/11/2016  . Personal history of noncompliance with medical treatment, presenting hazards to health 08/11/2016  . OSA (obstructive sleep apnea) 08/11/2016  . CHF (congestive heart failure) ([place]) 08/11/2016  . Oxygen dependent 08/11/2016    Outpatient Encounter Prescriptions as of 09/24/2016  Medication Sig  . atorvastatin (LIPITOR) 40 MG tablet Take 1 tablet (40 mg total) by mouth daily.  . blood glucose meter kit and supplies KIT Dispense based on patient and insurance preference. Use up to four times daily as directed. (FOR ICD-9 250.00, 250.01).  . carvedilol (COREG) 25 MG tablet Take 1 tablet (25 mg total) by mouth 2 (two) times daily with a meal.  . furosemide (LASIX) 20 MG tablet Take 1 tablet (20 mg total) by mouth 2  (two) times daily.  . hydrALAZINE (APRESOLINE) 100 MG tablet Take 1 tablet (100 mg total) by mouth 3 (three) times daily.  . hydrochlorothiazide (HYDRODIURIL) 25 MG tablet Take 1 tablet (25 mg total) by mouth daily.  . metFORMIN (GLUCOPHAGE) 1000 MG tablet Take 1 tablet (1,000 mg total) by mouth 2 (two) times daily.   No facility-administered encounter medications on file as of 09/24/2016.     Allergies  Allergen Reactions  . Morphine And Related     Heart beat fast   . Nicotine     Rash from the patch     Review of Systems  Constitutional: Positive for unexpected weight change. Negative for chills and fever.       Gain  HENT: Negative for congestion and dental problem.   Eyes: Negative for redness and visual disturbance.  Respiratory: Positive for shortness of breath. Negative for cough.        Chronic dyspnea  Cardiovascular: Positive for leg swelling. Negative for chest pain and palpitations.  Gastrointestinal: Negative for constipation and diarrhea.  Genitourinary: Positive for frequency. Negative for difficulty urinating.  Musculoskeletal: Positive for back pain and gait problem.  Neurological: Negative for dizziness and headaches.  Psychiatric/Behavioral: Positive for sleep disturbance. Negative for dysphoric mood.       Sleep is improved on BiPAP    BP (!) 170/110 (BP Location: Left Arm, Patient Position: Sitting, Cuff Size: Large)   Pulse 88   Temp 98.7 F (37.1 C) (  Oral)   Resp 20   Ht '5\' 11"'  (1.803 m)   Wt (!) 400 lb 0.6 oz (181.5 kg)   SpO2 (!) 88% Comment: room air  BMI 55.79 kg/m   Physical Exam  Constitutional: He appears well-developed.  Overnourished. Morbidly obese. On oxygen.  HENT:  Head: Normocephalic and atraumatic.  Mouth/Throat: Oropharynx is clear and moist.  Needs dental repair  Eyes: Conjunctivae are normal. Pupils are equal, round, and reactive to light.  Eyelids puffy  Neck: Normal range of motion.  Cardiovascular: Regular rhythm and  normal heart sounds.   Pulmonary/Chest:  Diminished breath sounds  Abdominal:  Obese, soft, bowel sounds active  Musculoskeletal: He exhibits edema.  Tense pedal edema  Lymphadenopathy:    He has no cervical adenopathy.  Skin:  Skin on anterior lower legs dry, discussed importance of moisturizing  Psychiatric: He has a normal mood and affect. His behavior is normal.    ASSESSMENT/PLAN:  1. Hypoxemia Without oxygen, resting measurement is 88% within minutes  2. Diabetes mellitus type 2 in obese (HCC) Uncertain control. Patient does not check blood sugar  3. Pulmonary HTN   4. OSA (obstructive sleep apnea) Improved symptomatically on BiPAP  5. Oxygen dependent  Noncompliance   Patient Instructions  We have refilled all of your medicine  We are working on portable concentrator  Take your pills every day  Do your chair exercises twice a day every day  Work on losing weight   Continue to avoid salt  See me in a couple of months  Due for lab testing next time    Raylene Everts, MD

## 2016-09-29 ENCOUNTER — Ambulatory Visit: Payer: Self-pay | Admitting: Nutrition

## 2016-10-06 NOTE — Progress Notes (Signed)
Please call Woodward to let him know I am aware that he no showed for 2 nutrition visits in a row.  I feel the nutrition visit is important and want him to make a better effort to attend the next one.  YSN

## 2016-10-07 ENCOUNTER — Encounter: Payer: Self-pay | Admitting: Internal Medicine

## 2016-10-07 ENCOUNTER — Ambulatory Visit (INDEPENDENT_AMBULATORY_CARE_PROVIDER_SITE_OTHER): Payer: Medicaid Other | Admitting: Internal Medicine

## 2016-10-07 VITALS — BP 140/84 | HR 106 | Ht 70.0 in | Wt 388.0 lb

## 2016-10-07 DIAGNOSIS — R0609 Other forms of dyspnea: Secondary | ICD-10-CM

## 2016-10-07 DIAGNOSIS — J989 Respiratory disorder, unspecified: Secondary | ICD-10-CM

## 2016-10-07 NOTE — Progress Notes (Signed)
Subjective:     Patient ID: Ross Schmidt, male   DOB: 11/17/1985, 30 y.o.   MRN: [id number]  HPI  30 yobm quit 02/2016 born prematurely and stayed 45 days in NICU but not on Vent and did fine resp wise until age6 but variable sob and need for saba since 2003  at wt 250 at baseline and gradually gained >100 lbs over that   10/07/2016 1st [place] [facility] office visit/ Imani Sherrin   Chief Complaint  Patient presents with  . Pulmonary Consult    Referred by: Dr. Delton See: SOB, uses BIPAP nightly d/t Sleep apnea    on 02 24h / day since may 2017 / followed by Dr Colette Ribas in [place] on bipap Doe = MMRC1 = can walk nl pace, flat grade, can't hurry or go uphills or steps s sob  On 02 // can't do it without 02   No obvious day to day or daytime variability or assoc excess/ purulent sputum or mucus plugs or hemoptysis or cp or chest tightness, subjective wheeze or overt sinus or hb symptoms. No unusual exp hx or h/o childhood pna/ asthma or knowledge of premature birth.  Sleeping ok without nocturnal  or early am exacerbation  of respiratory  c/o's or need for noct saba. Also denies any obvious fluctuation of symptoms with weather or environmental changes or other aggravating or alleviating factors except as outlined above   Current Medications, Allergies, Complete Past Medical History, Past Surgical History, Family History, and Social History were reviewed in [employer] record.  ROS  The following are not active complaints unless bolded sore throat, dysphagia, dental problems, itching, sneezing,  nasal congestion or excess/ purulent secretions, ear ache,   fever, chills, sweats, unintended wt loss, classically pleuritic or exertional cp,  orthopnea pnd or leg swelling, presyncope, palpitations, abdominal pain, anorexia, nausea, vomiting, diarrhea  or change in bowel or bladder habits, change in stools or urine, dysuria,hematuria,  rash, arthralgias, visual complaints, headache, numbness,  weakness or ataxia or problems with walking or coordination,  change in mood/affect or memory.        Review of Systems     Objective:   Physical Exam  amb obese bm nad   Wt Readings from Last 3 Encounters:  10/07/16 (!) 388 lb (176 kg)  09/24/16 (!) 400 lb 0.6 oz (181.5 kg)  09/14/16 (!) 394 lb 3.2 oz (178.8 kg)    Vital signs reviewed  - Note on arrival 02 sats  95% on   4lpm   HEENT: nl dentition, turbinates, and oropharynx. Nl external ear canals without cough reflex   NECK :  without JVD/Nodes/TM/ nl carotid upstrokes bilaterally   LUNGS: no acc muscle use,  Nl contour chest which is clear to A and P bilaterally without cough on insp or exp maneuvers   CV:  RRR  no s3 or murmur or increase in P2, nad no edema   ABD:  Massively obese/ soft and nontender with nl inspiratory excursion in the supine position. No bruits or organomegaly appreciated, bowel sounds nl  MS:  Nl gait/ ext warm without deformities, calf tenderness, cyanosis or clubbing No obvious joint restrictions   SKIN: warm and dry without lesions    NEURO:  alert, approp, nl sensorium with  no motor or cerebellar deficits apparent.    10/07/2016 rec cxr > did not go     Assessment:

## 2016-10-07 NOTE — Patient Instructions (Signed)
Weight control is simply a matter of calorie balance which needs to be tilted in your favor by eating less and exercising more.  To get the most out of exercise, you need to be continuously aware that you are short of breath, but never out of breath, for 30 minutes daily while wearing 02.  As you improve, it will actually be easier for you to do the same amount of exercise  in  30 minutes so always push to the level where you are short of breath.  If this does not result in gradual weight reduction then I strongly recommend you see a nutritionist with a food diary x 2 weeks so that we can work out a negative calorie balance which is universally effective in steady weight loss programs.  Think of your calorie balance like you do your bank account where in this case you want the balance to go down so you must take in less calories than you burn up.  It's just that simple:  Hard to do, but easy to understand.  Good luck!   Please remember to go to the   x-ray department downstairs for your tests - we will call you with the results when they are available.  We can see you in [place] as needed but since Dr Colette Ribas is treating you with the bipap she can follow your breathing too

## 2016-10-11 NOTE — Assessment & Plan Note (Addendum)
Spirometry 10/07/2016  No obstruction even in mid flows/ no curvature at all to f/v loop   His problem appears to be entirely related to obesity and already seeing Dr Colette Ribas (pulmonary) in [place] for  OSA if a not also OHS and certainly at risk of secondary PH but happy with his care there so really nothing else to offer in this clinic (treatment for secondary PH is treat the underlying cause)   Total time devoted to counseling  > 50 % of  45 moffice visit:  review case with pt/ discussion of options/alternatives/ personally creating written customized instructions  in presence of pt  then going over those specific  Instructions directly with the pt including how to use all of the meds but in particular covering each new medication in detail and the difference between the maintenance/automatic meds and the prns using an action plan format for the latter.  Please see AVS from this visit for a full list of these instructions

## 2016-10-11 NOTE — Assessment & Plan Note (Signed)
Body mass index is 55.67 kg/m.  No results found for: TSH   Contributing to gerd tendency/ doe/reviewed the need and the process to achieve and maintain neg calorie balance > defer f/u primary care including intermittently monitoring thyroid status

## 2016-10-15 ENCOUNTER — Ambulatory Visit: Payer: Self-pay | Admitting: Nutrition

## 2016-11-17 ENCOUNTER — Encounter: Payer: Self-pay | Admitting: *Deleted

## 2016-11-17 ENCOUNTER — Encounter: Payer: Self-pay | Admitting: Cardiovascular Disease

## 2016-11-17 ENCOUNTER — Ambulatory Visit (INDEPENDENT_AMBULATORY_CARE_PROVIDER_SITE_OTHER): Payer: Medicaid Other | Admitting: Cardiovascular Disease

## 2016-11-17 VITALS — BP 172/100 | HR 79 | Ht 70.0 in | Wt 383.0 lb

## 2016-11-17 DIAGNOSIS — I5022 Chronic systolic (congestive) heart failure: Secondary | ICD-10-CM | POA: Diagnosis not present

## 2016-11-17 DIAGNOSIS — I428 Other cardiomyopathies: Secondary | ICD-10-CM

## 2016-11-17 DIAGNOSIS — R9431 Abnormal electrocardiogram [ECG] [EKG]: Secondary | ICD-10-CM

## 2016-11-17 DIAGNOSIS — N183 Chronic kidney disease, stage 3 unspecified: Secondary | ICD-10-CM

## 2016-11-17 DIAGNOSIS — I10 Essential (primary) hypertension: Secondary | ICD-10-CM | POA: Diagnosis not present

## 2016-11-17 DIAGNOSIS — G4733 Obstructive sleep apnea (adult) (pediatric): Secondary | ICD-10-CM

## 2016-11-17 MED ORDER — SACUBITRIL-VALSARTAN 24-26 MG PO TABS: 1.0000 | ORAL_TABLET | Freq: Two times a day (BID) | ORAL | 6 refills | Status: DC

## 2016-11-17 MED ORDER — SACUBITRIL-VALSARTAN 24-26 MG PO TABS: 1.0000 | ORAL_TABLET | Freq: Two times a day (BID) | ORAL | 0 refills | Status: DC

## 2016-11-17 NOTE — Progress Notes (Signed)
SUBJECTIVE: The patient presents for follow-up of cardiomyopathy and chronic systolic heart failure.  Echocardiogram 09/08/16 showed normal left ventricular size, mild concentric LVH, LVEF 30-35%, normal right ventricular size and systolic function, severe left atrial dilatation, and no gross valvular pathology.  He is feeling better than he was at his last visit. He is more active. His symptoms of exertional dyspnea have improved. He tells me his CPAP mask was chipped but it has been corrected and he is getting better sleep.   Blood pressure is 172/100 but says he just took his medications. Weight today is 383 pounds, had been 394 pounds on 09/14/16.  Review of Systems: As per "subjective", otherwise negative.  Allergies  Allergen Reactions  . Morphine And Related     Heart beat fast   . Nicotine     Rash from the patch     Current Outpatient Prescriptions  Medication Sig Dispense Refill  . atorvastatin (LIPITOR) 40 MG tablet Take 1 tablet (40 mg total) by mouth daily. 90 tablet 1  . blood glucose meter kit and supplies KIT Dispense based on patient and insurance preference. Use up to four times daily as directed. (FOR ICD-9 250.00, 250.01). 1 each 0  . carvedilol (COREG) 25 MG tablet Take 1 tablet (25 mg total) by mouth 2 (two) times daily with a meal. 180 tablet 1  . furosemide (LASIX) 20 MG tablet Take 1 tablet (20 mg total) by mouth 2 (two) times daily. 180 tablet 1  . hydrALAZINE (APRESOLINE) 100 MG tablet Take 1 tablet (100 mg total) by mouth 3 (three) times daily. 180 tablet 1  . hydrochlorothiazide (HYDRODIURIL) 25 MG tablet Take 1 tablet (25 mg total) by mouth daily. 30 tablet 1  . metFORMIN (GLUCOPHAGE) 1000 MG tablet Take 1 tablet (1,000 mg total) by mouth 2 (two) times daily. 180 tablet 1   No current facility-administered medications for this visit.     Past Medical History:  Diagnosis Date  . Asthma   . CHF (congestive heart failure) ([place])   . COPD (chronic  obstructive pulmonary disease) ([place])   . Diabetes mellitus without complication ([place])   . HLD (hyperlipidemia) 08/11/2016  . Hypertension   . Obesity   . Oxygen deficiency   . Premature baby   . Sleep apnea     Past Surgical History:  Procedure Laterality Date  . TONSILLECTOMY      Social History   Social History  . Marital status: Single    Spouse name: N/A  . Number of children: 0  . Years of education: 10   Occupational History  .      disability   Social History Main Topics  . Smoking status: Former Smoker    Packs/day: 0.25    Types: Cigarettes    Start date: 10/12/1997    Quit date: 02/10/2016  . Smokeless tobacco: Never Used  . Alcohol use No  . Drug use: No  . Sexual activity: Not Currently   Other Topics Concern  . Not on file   Social History Narrative   Lives with mother and step father   disabled     Vitals:   11/17/16 1003  BP: (!) 172/100  Pulse: 79  SpO2: 100%  Weight: (!) 383 lb (173.7 kg)  Height: '5\' 10"'  (1.778 m)    PHYSICAL EXAM General: NAD, using nasal cannula Neck: Thick neck. Unable to assess JVP.  Lungs: Diminished throughout, no rales or wheezes. CV: Nondisplaced PMI.  Regular rate and rhythm, normal S1/S2, no S3/S4, no murmur.  Brawny trace pretibial edema b/l.  Abdomen: Morbidly obese. Neurologic: Alert and oriented x 3.  Psych: Normal affect. HEENT: Normal.      ECG: Most recent ECG reviewed.      ASSESSMENT AND PLAN: 1. Abnormal ECG: Has multiple CV risk factors. I will obtain a stress test (dobutamine Myoview).  2. Malignant hypertension: Markedly elevated. Already on Coreg 25 mg bid, hydralazine 100 mg tid, and HCTZ 25 mg. I will add Entresto 24/26 mg bid. Return for BP check with nurse in 2 weeks. If BP gets too low or he gets dizzy, I would reduce hydralazine dose to allow for [facility] which is a more important medication for him.  3. CKD stage 3: Stable. Check BMET this week given addition of  Entresto.  4. Sleep apnea: On Bipap.  5. Chronic systolic heart failure, LVEF 30-35%: He is reportedly on Lasix 20 mg twice daily. I will start Entresto 24/26 mg bid and check a BMET this week.  5. Chronic hypoxic respiratory failure: Uses 4L O2 chronically since May. Already made a pulmonary referral. Now using CPAP correctly for OSA.  Dispo: fu 2 months   Kate Sable, M.D., F.A.C.C.

## 2016-11-17 NOTE — Patient Instructions (Signed)
Medication Instructions:   Begin Entrtesto 24/26mg  twice a day  Samples & printed scripts provided today.  Continue all other medications.    Labwork:  BMET - due 3 days after starting Entresto.   Office will contact with results via phone or letter.    Testing/Procedures:  Your physician has requested that you have a dobutamine myoview. For furth information please visit [email or web address] Please follow instruction sheet, as given.  Office will contact with results via phone or letter.    Follow-Up: 2 months  Any Other Special Instructions Will Be Listed Below (If Applicable).  If you need a refill on your cardiac medications before your next appointment, please call your pharmacy.

## 2016-11-26 ENCOUNTER — Ambulatory Visit (INDEPENDENT_AMBULATORY_CARE_PROVIDER_SITE_OTHER): Payer: Medicaid Other | Admitting: Family Medicine

## 2016-11-26 DIAGNOSIS — Z5329 Procedure and treatment not carried out because of patient's decision for other reasons: Secondary | ICD-10-CM

## 2016-11-27 ENCOUNTER — Inpatient Hospital Stay (HOSPITAL_COMMUNITY): Admission: RE | Admit: 2016-11-27 | Payer: Medicaid Other | Source: Ambulatory Visit

## 2016-11-27 ENCOUNTER — Ambulatory Visit (HOSPITAL_COMMUNITY): Admission: RE | Admit: 2016-11-27 | Payer: Medicaid Other | Source: Ambulatory Visit

## 2016-12-01 NOTE — Progress Notes (Signed)
NO SHOW LETTER SENT

## 2016-12-15 ENCOUNTER — Ambulatory Visit (INDEPENDENT_AMBULATORY_CARE_PROVIDER_SITE_OTHER): Payer: Medicaid Other | Admitting: Family Medicine

## 2016-12-15 ENCOUNTER — Encounter: Payer: Self-pay | Admitting: Family Medicine

## 2016-12-15 VITALS — BP 158/96 | HR 104 | Temp 99.0°F | Resp 20 | Ht 70.0 in | Wt 389.1 lb

## 2016-12-15 DIAGNOSIS — E669 Obesity, unspecified: Secondary | ICD-10-CM

## 2016-12-15 DIAGNOSIS — E1169 Type 2 diabetes mellitus with other specified complication: Secondary | ICD-10-CM

## 2016-12-15 DIAGNOSIS — I10 Essential (primary) hypertension: Secondary | ICD-10-CM | POA: Diagnosis not present

## 2016-12-15 DIAGNOSIS — Z91199 Patient's noncompliance with other medical treatment and regimen due to unspecified reason: Secondary | ICD-10-CM

## 2016-12-15 DIAGNOSIS — Z9119 Patient's noncompliance with other medical treatment and regimen: Secondary | ICD-10-CM | POA: Diagnosis not present

## 2016-12-15 DIAGNOSIS — G4733 Obstructive sleep apnea (adult) (pediatric): Secondary | ICD-10-CM | POA: Diagnosis not present

## 2016-12-15 DIAGNOSIS — I272 Pulmonary hypertension, unspecified: Secondary | ICD-10-CM

## 2016-12-15 DIAGNOSIS — E785 Hyperlipidemia, unspecified: Secondary | ICD-10-CM | POA: Diagnosis not present

## 2016-12-15 NOTE — Progress Notes (Signed)
Chief Complaint  Patient presents with  . Follow-up    3 month   I had a discussion with Ross Schmidt about the letter I sent him regarding noncompliance. He has missed appointments with me. He has missed lab work appointments. He missed his stress test. He missed his nutrition consultation. I explained to him that his health was important to me, and that I needed for him to keep these appointments in order to provide him quality medical care. He promises to do better. He tells me he is more compliant in taking his medicines every day, trying to walk every day. He does not weigh himself, does not have a scale. Patient has morbid obesity is a significant medical problem. This contributes to his hyperlipidemia, hypertension, diabetes, and hypoventilation syndrome. He has gained weight since his last visit. We discussed that this is against my advice, and that he must make a bigger effort to watch his portions, follow a diet, and exercise. He agrees to see nutrition and I reordered this consult. I recommended she take his mother with him to this visit since she predominantly shops and cooks for him. His blood pressure is not at goal. He is improved. He denies any chest pain or pressure. He denies any palpitations He is oxygen dependent and chronically short of breath. This is unchanged from prior visit. He has not had any lab work since I began treating him months ago. He agrees to go for lab testing today. He tells me it is because he has difficulty getting to the laboratory in the fasting state. I told her I would rather he have a non-fasting labs done at all. He has not had any fall or injury, no illness since he was last seen.   Patient Active Problem List   Diagnosis Date Noted  . Dyspnea on exertion 10/07/2016  . Extremely obese with respiratory disorder ([place]) 08/11/2016  . Diabetes mellitus type 2 in obese ([place]) 08/11/2016  . Pulmonary HTN 08/11/2016  . HTN (hypertension) 08/11/2016  . HLD  (hyperlipidemia) 08/11/2016  . Personal history of noncompliance with medical treatment, presenting hazards to health 08/11/2016  . OSA (obstructive sleep apnea) 08/11/2016  . CHF (congestive heart failure) ([place]) 08/11/2016  . Oxygen dependent 08/11/2016    Outpatient Encounter Prescriptions as of 12/15/2016  Medication Sig  . atorvastatin (LIPITOR) 40 MG tablet Take 1 tablet (40 mg total) by mouth daily.  . blood glucose meter kit and supplies KIT Dispense based on patient and insurance preference. Use up to four times daily as directed. (FOR ICD-9 250.00, 250.01).  . carvedilol (COREG) 25 MG tablet Take 1 tablet (25 mg total) by mouth 2 (two) times daily with a meal.  . furosemide (LASIX) 20 MG tablet Take 1 tablet (20 mg total) by mouth 2 (two) times daily.  . hydrALAZINE (APRESOLINE) 100 MG tablet Take 1 tablet (100 mg total) by mouth 3 (three) times daily.  . hydrochlorothiazide (HYDRODIURIL) 25 MG tablet Take 1 tablet (25 mg total) by mouth daily.  . metFORMIN (GLUCOPHAGE) 1000 MG tablet Take 1 tablet (1,000 mg total) by mouth 2 (two) times daily.  . sacubitril-valsartan (ENTRESTO) 24-26 MG Take 1 tablet by mouth 2 (two) times daily.   No facility-administered encounter medications on file as of 12/15/2016.     Allergies  Allergen Reactions  . Morphine And Related     Heart beat fast   . Nicotine     Rash from the patch  Review of Systems  Constitutional: Positive for unexpected weight change. Negative for chills and fever.       Gain  HENT: Negative for congestion and dental problem.   Eyes: Negative for redness and visual disturbance.  Respiratory: Positive for shortness of breath. Negative for cough.        Chronic dyspnea. STable  Cardiovascular: Positive for leg swelling. Negative for chest pain and palpitations.       Improved  Gastrointestinal: Negative for constipation and diarrhea.  Genitourinary: Positive for frequency. Negative for difficulty urinating.    Musculoskeletal: Positive for back pain and gait problem.  Neurological: Negative for dizziness and headaches.  Psychiatric/Behavioral: Positive for sleep disturbance. Negative for dysphoric mood.       Sleep is improved on BiPAP    BP (!) 158/96 (BP Location: Right Arm, Patient Position: Sitting, Cuff Size: Large)   Pulse (!) 104   Temp 99 F (37.2 C) (Temporal)   Resp 20   Ht _0  (1.778 m)   Wt (!) 389 lb 1.9 oz (176.5 kg)   SpO2 92% Comment: o2 4 lpm via [place]  BMI 55.83 kg/m   Physical Exam  Constitutional: He appears well-developed.  Overnourished. Morbidly obese. On oxygen.  HENT:  Head: Normocephalic and atraumatic.  Mouth/Throat: Oropharynx is clear and moist.  Needs dental repair  Eyes: Conjunctivae are normal. Pupils are equal, round, and reactive to light.  Eyelids puffy  Neck: Normal range of motion.  Cardiovascular: Regular rhythm and normal heart sounds.   Pulmonary/Chest:  Diminished breath sounds  Abdominal:  Obese, soft, bowel sounds active  Musculoskeletal: He exhibits edema.  Tense pedal edema  Lymphadenopathy:    He has no cervical adenopathy.  Skin:  Skin on anterior lower legs dry, discussed importance of moisturizing  Psychiatric: He has a normal mood and affect. His behavior is normal.    ASSESSMENT/PLAN:  1. Diabetes mellitus type 2 in obese [facility]) Patient sent for lab work today to check his hemoglobin A1c - Ambulatory referral to Nutrition and Diabetic Education - TSH  2. Pulmonary HTN  3. OSA (obstructive sleep apnea) Compliant with BiPAP  4. Essential hypertension Improved but not at goal  5. Hyperlipidemia, unspecified hyperlipidemia type Lipid profile pending today  6. History of noncompliance with medical treatment, presenting hazards to health Discussed with patient. Discussed his morbid obesity and specific goals for treatment including reduced portions, reduce fats, nutrition visit. Daily walking. He will try to lose 1-2  pounds a week. See me in 3 months.Greater than 50% of this visit was spent in counseling and coordinating care.  Total face to face time:  Greater than 50% of this visit was spent in counseling and coordinating care.  Total face to face time: 45 minutes   Patient Instructions  Continue current medicines Take all medicine daily Walk every day that you are able  [place] need to see the cardiologist on 12/30/16 CALL his office to see if you need to re schedule the missed stress test  You need to re schedule the diabetes educator This can be done in [place] but the results need to come to me  GET LAB TESTS TODAY I will send you a letter with your test results.  If there is anything of concern, we will call right away.  See me in 3 months PLEASE BE HERE 10-15 minutes before scheduled appointment      Raylene Everts, MD

## 2016-12-15 NOTE — Patient Instructions (Addendum)
Continue current medicines Take all medicine daily Walk every day that you are able  KEEP ALL APPOINTMENTS  You need to see the cardiologist on 12/30/16 CALL his office to see if you need to re schedule the missed stress test  You need to re schedule the diabetes educator This can be done in [place] but the results need to come to me  GET LAB TESTS TODAY I will send you a letter with your test results.  If there is anything of concern, we will call right away.  See me in 3 months PLEASE BE HERE 10-15 minutes before scheduled appointment

## 2016-12-16 ENCOUNTER — Encounter: Payer: Self-pay | Admitting: Family Medicine

## 2016-12-16 LAB — CBC
HEMATOCRIT: 39.2 % (ref 38.5–50.0)
HEMOGLOBIN: 11.7 g/dL — AB (ref 13.2–17.1)
MCH: 26.5 pg — AB (ref 27.0–33.0)
MCHC: 29.8 g/dL — ABNORMAL LOW (ref 32.0–36.0)
MCV: 88.9 fL (ref 80.0–100.0)
MPV: 10.6 fL (ref 7.5–12.5)
Platelets: 241 10*3/uL (ref 140–400)
RBC: 4.41 MIL/uL (ref 4.20–5.80)
RDW: 16.6 % — ABNORMAL HIGH (ref 11.0–15.0)
WBC: 7.9 10*3/uL (ref 3.8–10.8)

## 2016-12-16 LAB — COMPLETE METABOLIC PANEL WITH GFR
ALBUMIN: 3.8 g/dL (ref 3.6–5.1)
ALK PHOS: 78 U/L (ref 40–115)
ALT: 13 U/L (ref 9–46)
AST: 11 U/L (ref 10–40)
BUN: 11 mg/dL (ref 7–25)
CALCIUM: 8.5 mg/dL — AB (ref 8.6–10.3)
CHLORIDE: 97 mmol/L — AB (ref 98–110)
CO2: 40 mmol/L — ABNORMAL HIGH (ref 20–31)
Creat: 0.89 mg/dL (ref 0.60–1.35)
Glucose, Bld: 230 mg/dL — ABNORMAL HIGH (ref 65–99)
POTASSIUM: 4.6 mmol/L (ref 3.5–5.3)
Sodium: 142 mmol/L (ref 135–146)
Total Bilirubin: 0.4 mg/dL (ref 0.2–1.2)
Total Protein: 6.8 g/dL (ref 6.1–8.1)

## 2016-12-16 LAB — LIPID PANEL
CHOLESTEROL: 134 mg/dL (ref <200)
HDL: 24 mg/dL — AB (ref >40)
LDL CALC: 87 mg/dL (ref <100)
TRIGLYCERIDES: 115 mg/dL (ref <150)
Total CHOL/HDL Ratio: 5.6 Ratio — ABNORMAL HIGH (ref <5.0)
VLDL: 23 mg/dL (ref <30)

## 2016-12-16 LAB — MICROALBUMIN / CREATININE URINE RATIO
CREATININE, URINE: 366 mg/dL (ref 20–370)
Microalb, Ur: >600 mg/dL

## 2016-12-16 LAB — HEMOGLOBIN A1C
HEMOGLOBIN A1C: 7.3 % — AB (ref <5.7)
MEAN PLASMA GLUCOSE: 163 mg/dL

## 2016-12-16 LAB — TSH: TSH: 1.86 m[IU]/L (ref 0.40–4.50)

## 2016-12-24 ENCOUNTER — Other Ambulatory Visit: Payer: Self-pay | Admitting: Family Medicine

## 2016-12-24 DIAGNOSIS — D649 Anemia, unspecified: Secondary | ICD-10-CM

## 2016-12-30 ENCOUNTER — Encounter: Payer: Self-pay | Admitting: Cardiovascular Disease

## 2016-12-30 ENCOUNTER — Ambulatory Visit: Payer: Medicaid Other | Admitting: Cardiovascular Disease

## 2017-01-07 ENCOUNTER — Ambulatory Visit (INDEPENDENT_AMBULATORY_CARE_PROVIDER_SITE_OTHER): Payer: Medicaid Other | Admitting: Cardiovascular Disease

## 2017-01-07 ENCOUNTER — Encounter: Payer: Self-pay | Admitting: Cardiovascular Disease

## 2017-01-07 VITALS — BP 150/98 | HR 90 | Ht 69.0 in | Wt 394.0 lb

## 2017-01-07 DIAGNOSIS — J9611 Chronic respiratory failure with hypoxia: Secondary | ICD-10-CM | POA: Diagnosis not present

## 2017-01-07 DIAGNOSIS — Z9119 Patient's noncompliance with other medical treatment and regimen: Secondary | ICD-10-CM | POA: Diagnosis not present

## 2017-01-07 DIAGNOSIS — G4733 Obstructive sleep apnea (adult) (pediatric): Secondary | ICD-10-CM | POA: Diagnosis not present

## 2017-01-07 DIAGNOSIS — Z9114 Patient's other noncompliance with medication regimen: Secondary | ICD-10-CM

## 2017-01-07 DIAGNOSIS — Z9981 Dependence on supplemental oxygen: Secondary | ICD-10-CM

## 2017-01-07 DIAGNOSIS — N183 Chronic kidney disease, stage 3 unspecified: Secondary | ICD-10-CM

## 2017-01-07 DIAGNOSIS — I428 Other cardiomyopathies: Secondary | ICD-10-CM

## 2017-01-07 DIAGNOSIS — R9431 Abnormal electrocardiogram [ECG] [EKG]: Secondary | ICD-10-CM

## 2017-01-07 DIAGNOSIS — I10 Essential (primary) hypertension: Secondary | ICD-10-CM | POA: Diagnosis not present

## 2017-01-07 DIAGNOSIS — I5022 Chronic systolic (congestive) heart failure: Secondary | ICD-10-CM | POA: Diagnosis not present

## 2017-01-07 DIAGNOSIS — Z91199 Patient's noncompliance with other medical treatment and regimen due to unspecified reason: Secondary | ICD-10-CM

## 2017-01-07 MED ORDER — SACUBITRIL-VALSARTAN 24-26 MG PO TABS: 1.0000 | ORAL_TABLET | Freq: Two times a day (BID) | ORAL | 6 refills | Status: DC

## 2017-01-07 NOTE — Patient Instructions (Addendum)
Medication Instructions:   Please do your Lasix twice a day.   Samples & new prescription sent to pharmacy today for [facility].   Continue all other medications.    Labwork: none  Testing/Procedures: Reschedule the Dobutamine Myoview  Follow-Up: 3 months   Any Other Special Instructions Will Be Listed Below (If Applicable).  If you need a refill on your cardiac medications before your next appointment, please call your pharmacy.

## 2017-01-07 NOTE — Progress Notes (Signed)
SUBJECTIVE: The patient returns for follow-up after undergoing cardiovascular testing performed for the evaluation of abnormal ECG in the context of malignant hypertension and chronic systolic heart failure.  It does not appear he got the dobutamine Myoview stress test performed which I recently ordered. It appears he did not go to the hospital to have it done.  Echocardiogram 09/08/16 showed normal left reticular size, mild concentric LVH, moderate to severely reduced LVEF 30-35%, and severe left atrial dilatation.  Weight is back up to 394 pounds. He was 383 pounds on 11/17/16.  He said he ran out of [place] 2 days ago. He has also only been taking Lasix once daily rather than twice daily. He said he is trying to eat better. He feels like his shortness of breath is stable.  Blood pressure 150/98 today.    Review of Systems: As per "subjective", otherwise negative.  Allergies  Allergen Reactions  . Morphine And Related     Heart beat fast   . Nicotine     Rash from the patch     Current Outpatient Prescriptions  Medication Sig Dispense Refill  . atorvastatin (LIPITOR) 40 MG tablet Take 1 tablet (40 mg total) by mouth daily. 90 tablet 1  . blood glucose meter kit and supplies KIT Dispense based on patient and insurance preference. Use up to four times daily as directed. (FOR ICD-9 250.00, 250.01). 1 each 0  . carvedilol (COREG) 25 MG tablet Take 1 tablet (25 mg total) by mouth 2 (two) times daily with a meal. 180 tablet 1  . furosemide (LASIX) 20 MG tablet Take 1 tablet (20 mg total) by mouth 2 (two) times daily. 180 tablet 1  . hydrALAZINE (APRESOLINE) 100 MG tablet Take 1 tablet (100 mg total) by mouth 3 (three) times daily. 180 tablet 1  . hydrochlorothiazide (HYDRODIURIL) 25 MG tablet Take 1 tablet (25 mg total) by mouth daily. 30 tablet 1  . metFORMIN (GLUCOPHAGE) 1000 MG tablet Take 1 tablet (1,000 mg total) by mouth 2 (two) times daily. 180 tablet 1  .  sacubitril-valsartan (ENTRESTO) 24-26 MG Take 1 tablet by mouth 2 (two) times daily. 60 tablet 0   No current facility-administered medications for this visit.     Past Medical History:  Diagnosis Date  . Asthma   . CHF (congestive heart failure) ([place])   . COPD (chronic obstructive pulmonary disease) ([place])   . Diabetes mellitus without complication ([place])   . HLD (hyperlipidemia) 08/11/2016  . Hypertension   . Obesity   . Oxygen deficiency   . Premature baby   . Sleep apnea     Past Surgical History:  Procedure Laterality Date  . TONSILLECTOMY      Social History   Social History  . Marital status: Single    Spouse name: N/A  . Number of children: 0  . Years of education: 10   Occupational History  .      disability   Social History Main Topics  . Smoking status: Former Smoker    Packs/day: 0.25    Types: Cigarettes    Start date: 10/12/1997    Quit date: 02/10/2016  . Smokeless tobacco: Never Used  . Alcohol use No  . Drug use: No  . Sexual activity: Not Currently   Other Topics Concern  . Not on file   Social History Narrative   Lives with mother and step father   disabled     Vitals:   01/07/17  1123  BP: (!) 150/98  Pulse: 90  SpO2: 92%  Weight: (!) 394 lb (178.7 kg)  Height: _0  (1.753 m)    PHYSICAL EXAM General: NAD HEENT: Normal. Neck: Due to body habitus, unable to assess JVP. Lungs: Diminished throughout, no rales or wheezes. CV: Nondisplaced PMI.  Regular rate and rhythm, normal S1/S2, no S3/S4, no murmur.  Brawny trace pretibial bilateral edema Abdomen: Obese. Neurologic: Alert and oriented.  Psych: Normal affect. Skin: Normal. Musculoskeletal: No gross deformities.    ECG: Most recent ECG reviewed.      ASSESSMENT AND PLAN: 1. Abnormal ECG: I will reschedule dobutamine Myoview stress test. I encouraged him not to cancel it this time.  2. Malignant hypertension markedly elevated but ran out of Entresto 2 days ago. I will  give him more samples. I will try and see why this was not obtained. He takes Coreg 25 mg twice daily, hydralazine 100 mg 3 times daily, and hydrochlorothiazide 25 mg daily.  3. Chronic kidney disease stage III: Stable. BUN 11, creatinine 0.89 on 12/15/16.  4. Sleep apnea: On BiPAP.  5. Chronic systolic heart failure, LVEF 30-35%: He has been noncompliant and only taking Lasix once daily. I encouraged him to take Lasix 20 mg twice daily. Again, I will give him samples of Entresto 24/26 mg bid.  6. Chronic hypoxic respiratory failure: Uses 4 L of oxygen chronically since May 2017. I previously made a pulmonary referral. He uses CPAP for OSA.  Disposition: Follow-up 3 months.   Kate Sable, M.D., F.A.C.C.

## 2017-01-08 ENCOUNTER — Telehealth: Payer: Self-pay | Admitting: Cardiovascular Disease

## 2017-01-08 NOTE — Telephone Encounter (Signed)
Pre-cert Verification for the following procedure   °Lexiscan myoview scheduled for 01/14/17 at [place]  °

## 2017-01-14 ENCOUNTER — Inpatient Hospital Stay (HOSPITAL_COMMUNITY): Admission: RE | Admit: 2017-01-14 | Payer: Medicaid Other | Source: Ambulatory Visit

## 2017-01-14 ENCOUNTER — Encounter (HOSPITAL_COMMUNITY): Payer: Medicaid Other

## 2017-01-27 ENCOUNTER — Encounter: Payer: Self-pay | Admitting: Family Medicine

## 2017-02-02 ENCOUNTER — Ambulatory Visit: Payer: Medicaid Other | Admitting: Nutrition

## 2017-02-04 ENCOUNTER — Encounter: Payer: Self-pay | Admitting: *Deleted

## 2017-03-15 ENCOUNTER — Other Ambulatory Visit: Payer: Self-pay

## 2017-03-15 MED ORDER — METFORMIN HCL 1000 MG PO TABS: 1000.0000 mg | ORAL_TABLET | Freq: Two times a day (BID) | ORAL | 1 refills | Status: DC

## 2017-03-16 ENCOUNTER — Other Ambulatory Visit: Payer: Self-pay

## 2017-03-16 ENCOUNTER — Ambulatory Visit: Payer: Medicaid Other | Admitting: Family Medicine

## 2017-03-16 NOTE — Progress Notes (Deleted)
[name] presented a s anew patient on 08/11/16.  At that visit I expressed my concern that he had multiple serious illness, and was noncompliant.  His noncompliance was discussed at each of the 5 visits to this office.  He was sent a noncompliance warning letter on 11/27/2015. He "no showed" for an appointment with me on 11/27/15 and was late for at least 2 of the 5 other appointments.  He failed to keep his appointment again today. He also had No shows for a cardiology visit and for both of the stress tests scheduled for his.  He cancelled all of his appointments with the diabetes educator. I am discharging him from my practice.  Letter sent today

## 2017-03-19 ENCOUNTER — Telehealth: Payer: Self-pay | Admitting: Family Medicine

## 2017-03-19 NOTE — Telephone Encounter (Signed)
Not negotiable.

## 2017-03-19 NOTE — Telephone Encounter (Signed)
Called and spoke to Ross Schmidt, states he " will do better if you let him back in". States he did get the reminder calls, but thought they were telemarketers.

## 2017-03-19 NOTE — Telephone Encounter (Signed)
Patient called requesting to discuss his letter he received from Dr.Nelson today. Patient states he really likes Dr.Nelson and wishes to continue care with her. [phone]

## 2017-03-23 ENCOUNTER — Ambulatory Visit: Payer: Medicaid Other | Admitting: Family Medicine

## 2017-03-25 ENCOUNTER — Other Ambulatory Visit: Payer: Self-pay | Admitting: Family Medicine

## 2017-03-25 ENCOUNTER — Telehealth: Payer: Self-pay | Admitting: *Deleted

## 2017-03-25 NOTE — Telephone Encounter (Signed)
Follow up call placed to patient - never had Dobutamine Stress test done.  3 month f/u scheduled for 04/09/2017.    Attempted call - not available.

## 2017-04-06 NOTE — Telephone Encounter (Signed)
Returned call.   Call [phone]

## 2017-04-06 NOTE — Telephone Encounter (Signed)
Attempted to reach patient again - left message on fiance mobile for a call back.

## 2017-04-08 NOTE — Telephone Encounter (Signed)
Discussed need for patient to get his stress test done prior to f/u with Dr. Purvis Sheffield.  Appointment for tomorrow has been cancelled & patient agrees to rescheduling the stress test.  Patient request the week of July 9th.  Message will be sent to [facility]) for rescheduling.

## 2017-04-09 ENCOUNTER — Ambulatory Visit: Payer: Medicaid Other | Admitting: Cardiovascular Disease

## 2017-04-09 ENCOUNTER — Telehealth: Payer: Self-pay | Admitting: *Deleted

## 2017-04-09 NOTE — Telephone Encounter (Signed)
LM for pt to return call - appt cx

## 2017-04-09 NOTE — Telephone Encounter (Signed)
-----   Message from Laqueta Linden, MD sent at 04/08/2017  8:38 AM EDT ----- Reschedule please.  ----- Message ----- From: Albertine Patricia, CMA Sent: 04/08/2017   7:24 AM To: Laqueta Linden, MD  This pt is scheduled to see you tomorrow but hasn't had stress test that you ordered in March - still see him or reschedule after stress test done?  Ross Schmidt

## 2017-04-14 ENCOUNTER — Other Ambulatory Visit: Payer: Self-pay | Admitting: Family Medicine

## 2017-04-21 ENCOUNTER — Telehealth: Payer: Self-pay | Admitting: Cardiovascular Disease

## 2017-04-21 NOTE — Telephone Encounter (Signed)
Patient stated that he had not received letter with instructions yet.  Patient advised to hold his Metformin & Coreg the morning of test only.  Also, npo 4-6 hours prior to test.

## 2017-04-21 NOTE — Telephone Encounter (Signed)
Needs to know what meds he needs to hold

## 2017-04-22 ENCOUNTER — Inpatient Hospital Stay (HOSPITAL_COMMUNITY): Admission: RE | Admit: 2017-04-22 | Payer: Medicaid Other | Source: Ambulatory Visit

## 2017-04-23 ENCOUNTER — Encounter (HOSPITAL_BASED_OUTPATIENT_CLINIC_OR_DEPARTMENT_OTHER)
Admission: RE | Admit: 2017-04-23 | Discharge: 2017-04-23 | Disposition: A | Discharge status: Home / Self Care | Payer: Medicaid Other | Source: Ambulatory Visit | Attending: Cardiovascular Disease | Admitting: Cardiovascular Disease

## 2017-04-23 ENCOUNTER — Encounter (HOSPITAL_COMMUNITY): Payer: Self-pay

## 2017-04-23 ENCOUNTER — Encounter (HOSPITAL_COMMUNITY)
Admission: RE | Admit: 2017-04-23 | Discharge: 2017-04-23 | Disposition: A | Discharge status: Home / Self Care | Payer: Medicaid Other | Source: Ambulatory Visit | Attending: Cardiovascular Disease | Admitting: Cardiovascular Disease

## 2017-04-23 DIAGNOSIS — I428 Other cardiomyopathies: Secondary | ICD-10-CM

## 2017-04-23 LAB — NM MYOCAR MULTI W/SPECT W/WALL MOTION / EF
CHL CUP NUCLEAR SRS: 5
CHL CUP NUCLEAR SSS: 7
CSEPPHR: 160 {beats}/min
LV dias vol: 160 mL (ref 62–150)
LV sys vol: 93 mL
RATE: 0.41
Rest HR: 70 {beats}/min
SDS: 2
TID: 1.37

## 2017-04-23 MED ORDER — SODIUM CHLORIDE 0.9% FLUSH
INTRAVENOUS | Status: AC
  Administered 2017-04-23: 11:00:00 via INTRAVENOUS
  Filled 2017-04-23: qty 10

## 2017-04-23 MED ORDER — TECHNETIUM TC 99M TETROFOSMIN IV KIT
30.0000 | PACK | Freq: Once | INTRAVENOUS | Status: AC | PRN
  Administered 2017-04-23: 29.5 via INTRAVENOUS

## 2017-04-23 MED ORDER — DOBUTAMINE IN D5W 4-5 MG/ML-% IV SOLN
INTRAVENOUS | Status: AC
  Administered 2017-04-23: 10 ug
  Filled 2017-04-23: qty 250

## 2017-04-23 MED ORDER — TECHNETIUM TC 99M TETROFOSMIN IV KIT
10.0000 | PACK | Freq: Once | INTRAVENOUS | Status: AC | PRN
  Administered 2017-04-23: 11.5 via INTRAVENOUS

## 2017-04-27 ENCOUNTER — Telehealth: Payer: Self-pay | Admitting: *Deleted

## 2017-04-27 NOTE — Telephone Encounter (Signed)
Notes recorded by Lesle Chris, LPN on 7/82/4235 at 9:18 AM EDT Patient notified. No pmd. Follow up scheduled for 05/17/2017 with Dr. Purvis Sheffield. ------  Notes recorded by Laqueta Linden, MD on 04/26/2017 at 10:13 AM EDT Abnormal. I will discuss at fu ov. Continue medical therapy for now.

## 2017-05-17 ENCOUNTER — Telehealth: Payer: Self-pay | Admitting: Cardiovascular Disease

## 2017-05-17 ENCOUNTER — Encounter: Payer: Self-pay | Admitting: Cardiovascular Disease

## 2017-05-17 ENCOUNTER — Encounter: Payer: Self-pay | Admitting: *Deleted

## 2017-05-17 ENCOUNTER — Ambulatory Visit (INDEPENDENT_AMBULATORY_CARE_PROVIDER_SITE_OTHER): Payer: Medicaid Other | Admitting: Cardiovascular Disease

## 2017-05-17 VITALS — BP 160/92 | HR 76 | Ht 69.0 in | Wt 380.0 lb

## 2017-05-17 DIAGNOSIS — R9431 Abnormal electrocardiogram [ECG] [EKG]: Secondary | ICD-10-CM

## 2017-05-17 DIAGNOSIS — J9611 Chronic respiratory failure with hypoxia: Secondary | ICD-10-CM

## 2017-05-17 DIAGNOSIS — I5022 Chronic systolic (congestive) heart failure: Secondary | ICD-10-CM

## 2017-05-17 DIAGNOSIS — I428 Other cardiomyopathies: Secondary | ICD-10-CM | POA: Diagnosis not present

## 2017-05-17 DIAGNOSIS — I10 Essential (primary) hypertension: Secondary | ICD-10-CM | POA: Diagnosis not present

## 2017-05-17 DIAGNOSIS — G4733 Obstructive sleep apnea (adult) (pediatric): Secondary | ICD-10-CM

## 2017-05-17 DIAGNOSIS — N183 Chronic kidney disease, stage 3 unspecified: Secondary | ICD-10-CM

## 2017-05-17 DIAGNOSIS — Z9981 Dependence on supplemental oxygen: Secondary | ICD-10-CM

## 2017-05-17 DIAGNOSIS — R9439 Abnormal result of other cardiovascular function study: Secondary | ICD-10-CM

## 2017-05-17 DIAGNOSIS — Z91199 Patient's noncompliance with other medical treatment and regimen due to unspecified reason: Secondary | ICD-10-CM

## 2017-05-17 DIAGNOSIS — Z9119 Patient's noncompliance with other medical treatment and regimen: Secondary | ICD-10-CM | POA: Diagnosis not present

## 2017-05-17 MED ORDER — FUROSEMIDE 40 MG PO TABS: 40.0000 mg | ORAL_TABLET | ORAL | 3 refills | Status: DC

## 2017-05-17 MED ORDER — SACUBITRIL-VALSARTAN 49-51 MG PO TABS: 1.0000 | ORAL_TABLET | Freq: Two times a day (BID) | ORAL | 3 refills | Status: DC

## 2017-05-17 NOTE — Telephone Encounter (Signed)
Left heart cath - Tuesday, 8/21 - 12:00 - Ross Schmidt

## 2017-05-17 NOTE — Patient Instructions (Addendum)
Medication Instructions:   Increase Intresto to 49/51mg  twice a day  Please take your Lasix 40mg  every morning.    Continue all other medications.    Labwork:  BMET - due 1 week after beginning the Lasix 40mg  daily.  Order given today.   Office will contact with results via phone or letter.    Testing/Procedures: Your physician has requested that you have a cardiac catheterization. Cardiac catheterization is used to diagnose and/or treat various heart conditions. Doctors may recommend this procedure for a number of different reasons. The most common reason is to evaluate chest pain. Chest pain can be a symptom of coronary artery disease (CAD), and cardiac catheterization can show whether plaque is narrowing or blocking your heart's arteries. This procedure is also used to evaluate the valves, as well as measure the blood flow and oxygen levels in different parts of your heart. For further information please visit [email or web address] Please follow instruction sheet, as given.  Follow-Up: 6 weeks   Any Other Special Instructions Will Be Listed Below (If Applicable).  If you need a refill on your cardiac medications before your next appointment, please call your pharmacy.

## 2017-05-17 NOTE — Progress Notes (Signed)
    SUBJECTIVE: The patient returns for follow-up after undergoing cardiovascular testing performed for the evaluation of abnormal ECG and CHF.  Nuclear stress test showed a small area of inferoapical ischemia, calculated LVEF 42%. There was an elevated tid of 1.37 which may represent balanced ischemia and increased cardiac risk. Overall, it was deemed a high risk study.  He denies chest pain. His chronic exertional dyspnea is stable. He has not been taking Lasix.     Review of Systems: As per "subjective", otherwise negative.  Allergies  Allergen Reactions  . Morphine And Related     Heart beat fast   . Nicotine     Rash from the patch     Current Outpatient Prescriptions  Medication Sig Dispense Refill  . atorvastatin (LIPITOR) 40 MG tablet TAKE 1 TABLET BY MOUTH DAILY 30 tablet 0  . blood glucose meter kit and supplies KIT Dispense based on patient and insurance preference. Use up to four times daily as directed. (FOR ICD-9 250.00, 250.01). 1 each 0  . carvedilol (COREG) 25 MG tablet TAKE 1 TABLET BY MOUTH TWICE DAILY 60 tablet 0  . furosemide (LASIX) 20 MG tablet Take 1 tablet (20 mg total) by mouth 2 (two) times daily. 180 tablet 1  . hydrALAZINE (APRESOLINE) 100 MG tablet Take 1 tablet (100 mg total) by mouth 3 (three) times daily. 180 tablet 1  . hydrochlorothiazide (HYDRODIURIL) 25 MG tablet Take 1 tablet (25 mg total) by mouth daily. 30 tablet 1  . metFORMIN (GLUCOPHAGE) 1000 MG tablet Take 1 tablet (1,000 mg total) by mouth 2 (two) times daily. 180 tablet 1  . sacubitril-valsartan (ENTRESTO) 24-26 MG Take 1 tablet by mouth 2 (two) times daily. 60 tablet 6   No current facility-administered medications for this visit.     Past Medical History:  Diagnosis Date  . Asthma   . CHF (congestive heart failure) ([facility])   . COPD (chronic obstructive pulmonary disease) ([facility])   . Diabetes mellitus without complication ([facility])   . HLD (hyperlipidemia) 08/11/2016  . Hypertension    . Obesity   . Oxygen deficiency   . Premature baby   . Sleep apnea     Past Surgical History:  Procedure Laterality Date  . TONSILLECTOMY      Social History   Social History  . Marital status: Single    Spouse name: N/A  . Number of children: 0  . Years of education: 10   Occupational History  .      disability   Social History Main Topics  . Smoking status: Former Smoker    Packs/day: 0.25    Types: Cigarettes    Start date: 10/12/1997    Quit date: 02/10/2016  . Smokeless tobacco: Never Used  . Alcohol use No  . Drug use: No  . Sexual activity: Not Currently   Other Topics Concern  . Not on file   Social History Narrative   Lives with mother and step father   disabled     Vitals:   05/17/17 1348  BP: (!) 160/92  Pulse: 76  SpO2: 96%  Weight: (!) 380 lb (172.4 kg)  Height: 5' 9" (1.753 m)    Wt Readings from Last 3 Encounters:  05/17/17 (!) 380 lb (172.4 kg)  01/07/17 (!) 394 lb (178.7 kg)  12/15/16 (!) 389 lb 1.9 oz (176.5 kg)     PHYSICAL EXAM General: NAD HEENT: Normal. Neck: Due to body habitus, unable to assess JVP. Lungs:   Diminished throughout, no rales or wheezes. CV: Nondisplaced PMI.  Regular rate and rhythm, normal S1/S2, no S3/S4, no murmur.  Brawny trace pretibial bilateral edema Abdomen: Obese. Neurologic: Alert and oriented.  Psych: Normal affect. Skin: Normal. Musculoskeletal: No gross deformities.    ECG: Most recent ECG reviewed.   Labs: Lab Results  Component Value Date/Time   K 4.6 12/15/2016 04:02 PM   BUN 11 12/15/2016 04:02 PM   CREATININE 0.89 12/15/2016 04:02 PM   ALT 13 12/15/2016 04:02 PM   TSH 1.86 12/15/2016 04:02 PM   HGB 11.7 (L) 12/15/2016 04:02 PM     Lipids: Lab Results  Component Value Date/Time   LDLCALC 87 12/15/2016 04:02 PM   CHOL 134 12/15/2016 04:02 PM   TRIG 115 12/15/2016 04:02 PM   HDL 24 (L) 12/15/2016 04:02 PM       ASSESSMENT AND PLAN:  1. Abnormal ECG and abnormal  nuclear stress test: I will arrange for coronary angiography. Continue Coreg and Lipitor. Risks and benefits of cardiac catheterization have been discussed with the patient.  These include bleeding, infection, kidney damage, stroke, heart attack, death.  The patient understands these risks and is willing to proceed.  2. Malignant hypertension: Elevated. He takes Coreg 25 mg twice daily, hydralazine 100 mg 3 times daily, Entresto 24/26 mg twice daily, and hydrochlorothiazide 25 mg daily. I will increase Entresto to 49/51 mg bid.  3. Chronic kidney disease stage III: Stable. BUN 11, creatinine 0.89 on 12/15/16. I will check a basic metabolic panel within the next week.  4. Sleep apnea: On BiPAP.  5. Chronic systolic heart failure, LVEF 30-35%: Continue Coreg. He has not been taking Lasix. I encouraged him to take 40 mg daily. I will increase Entresto to 49/51 mg bid. I will arrange for coronary angiography to assess for an ischemic etiology.  I will check a basic metabolic panel within the next week.   6. Chronic hypoxic respiratory failure: Uses 4 L of oxygen chronically since May 2017. I previously made a pulmonary referral. He uses CPAP for OSA.     Disposition: Follow up 6 weeks.  Time spent: 40 minutes, of which greater than 50% was spent reviewing symptoms, relevant blood tests and studies, and discussing management plan with the patient.    Jeniah Kishi, M.D., F.A.C.C. 

## 2017-05-18 ENCOUNTER — Other Ambulatory Visit: Payer: Self-pay | Admitting: Cardiovascular Disease

## 2017-05-18 DIAGNOSIS — R9439 Abnormal result of other cardiovascular function study: Secondary | ICD-10-CM

## 2017-05-18 DIAGNOSIS — I429 Cardiomyopathy, unspecified: Secondary | ICD-10-CM

## 2017-05-20 NOTE — Telephone Encounter (Signed)
Pt has Medicaid only.  No precert required for heart cath °

## 2017-05-31 ENCOUNTER — Telehealth: Payer: Self-pay

## 2017-05-31 NOTE — Telephone Encounter (Signed)
Patient contacted pre-catheterization at [facility] scheduled for:  06/01/2017 @ 1200 Verified arrival time and place:  [facility] @ 1000 Confirmed AM meds to be taken pre-cath with sip of water: Take ASA Hold Lasix, HCTZ day of procedure Last dose Metformin Sunday evening-do not take again until Friday Advised to take carvedilol, hydralazine and entresto Confirmed patient has responsible person to drive home post procedure and observe patient for 24 hours: mother Addl concerns:  Pt on 4L of oxygen

## 2017-06-01 ENCOUNTER — Ambulatory Visit (HOSPITAL_COMMUNITY)
Admission: RE | Admit: 2017-06-01 | Discharge: 2017-06-01 | Disposition: A | Discharge status: Home / Self Care | Payer: Medicaid Other | Source: Ambulatory Visit | Attending: Interventional Cardiology | Admitting: Interventional Cardiology

## 2017-06-01 ENCOUNTER — Encounter (HOSPITAL_COMMUNITY)
Admission: RE | Disposition: A | Discharge status: Home / Self Care | Payer: Self-pay | Source: Ambulatory Visit | Attending: Interventional Cardiology

## 2017-06-01 DIAGNOSIS — E785 Hyperlipidemia, unspecified: Secondary | ICD-10-CM | POA: Diagnosis not present

## 2017-06-01 DIAGNOSIS — R9439 Abnormal result of other cardiovascular function study: Secondary | ICD-10-CM | POA: Diagnosis not present

## 2017-06-01 DIAGNOSIS — J449 Chronic obstructive pulmonary disease, unspecified: Secondary | ICD-10-CM | POA: Insufficient documentation

## 2017-06-01 DIAGNOSIS — Z87891 Personal history of nicotine dependence: Secondary | ICD-10-CM | POA: Diagnosis not present

## 2017-06-01 DIAGNOSIS — J9611 Chronic respiratory failure with hypoxia: Secondary | ICD-10-CM | POA: Diagnosis not present

## 2017-06-01 DIAGNOSIS — I130 Hypertensive heart and chronic kidney disease with heart failure and stage 1 through stage 4 chronic kidney disease, or unspecified chronic kidney disease: Secondary | ICD-10-CM | POA: Insufficient documentation

## 2017-06-01 DIAGNOSIS — I429 Cardiomyopathy, unspecified: Secondary | ICD-10-CM | POA: Diagnosis not present

## 2017-06-01 DIAGNOSIS — Z6841 Body Mass Index (BMI) 40.0 and over, adult: Secondary | ICD-10-CM | POA: Diagnosis not present

## 2017-06-01 DIAGNOSIS — I428 Other cardiomyopathies: Secondary | ICD-10-CM | POA: Diagnosis not present

## 2017-06-01 DIAGNOSIS — I2510 Atherosclerotic heart disease of native coronary artery without angina pectoris: Secondary | ICD-10-CM | POA: Diagnosis not present

## 2017-06-01 DIAGNOSIS — Z885 Allergy status to narcotic agent status: Secondary | ICD-10-CM | POA: Diagnosis not present

## 2017-06-01 DIAGNOSIS — G4733 Obstructive sleep apnea (adult) (pediatric): Secondary | ICD-10-CM | POA: Insufficient documentation

## 2017-06-01 DIAGNOSIS — N183 Chronic kidney disease, stage 3 (moderate): Secondary | ICD-10-CM | POA: Insufficient documentation

## 2017-06-01 DIAGNOSIS — E1122 Type 2 diabetes mellitus with diabetic chronic kidney disease: Secondary | ICD-10-CM | POA: Diagnosis not present

## 2017-06-01 DIAGNOSIS — Z7984 Long term (current) use of oral hypoglycemic drugs: Secondary | ICD-10-CM | POA: Diagnosis not present

## 2017-06-01 DIAGNOSIS — I5022 Chronic systolic (congestive) heart failure: Secondary | ICD-10-CM | POA: Diagnosis not present

## 2017-06-01 DIAGNOSIS — E669 Obesity, unspecified: Secondary | ICD-10-CM | POA: Diagnosis not present

## 2017-06-01 HISTORY — PX: LEFT HEART CATH AND CORONARY ANGIOGRAPHY: CATH118249

## 2017-06-01 LAB — GLUCOSE, CAPILLARY
GLUCOSE-CAPILLARY: 119 mg/dL — AB (ref 65–99)
Glucose-Capillary: 131 mg/dL — ABNORMAL HIGH (ref 65–99)

## 2017-06-01 LAB — PROTIME-INR
INR: 0.92
Prothrombin Time: 12.3 seconds (ref 11.4–15.2)

## 2017-06-01 LAB — CBC
HEMATOCRIT: 34.7 % — AB (ref 39.0–52.0)
Hemoglobin: 10.8 g/dL — ABNORMAL LOW (ref 13.0–17.0)
MCH: 27.7 pg (ref 26.0–34.0)
MCHC: 31.1 g/dL (ref 30.0–36.0)
MCV: 89 fL (ref 78.0–100.0)
Platelets: 236 10*3/uL (ref 150–400)
RBC: 3.9 MIL/uL — ABNORMAL LOW (ref 4.22–5.81)
RDW: 15.3 % (ref 11.5–15.5)
WBC: 7.2 10*3/uL (ref 4.0–10.5)

## 2017-06-01 LAB — BASIC METABOLIC PANEL
ANION GAP: 9 (ref 5–15)
BUN: 12 mg/dL (ref 6–20)
CALCIUM: 8 mg/dL — AB (ref 8.9–10.3)
CO2: 27 mmol/L (ref 22–32)
Chloride: 108 mmol/L (ref 101–111)
Creatinine, Ser: 0.8 mg/dL (ref 0.61–1.24)
GFR calc Af Amer: >60 mL/min (ref >60)
GFR calc non Af Amer: >60 mL/min (ref >60)
GLUCOSE: 133 mg/dL — AB (ref 65–99)
Potassium: 4.5 mmol/L (ref 3.5–5.1)
Sodium: 144 mmol/L (ref 135–145)

## 2017-06-01 SURGERY — LEFT HEART CATH AND CORONARY ANGIOGRAPHY
Anesthesia: LOCAL

## 2017-06-01 MED ORDER — SODIUM CHLORIDE 0.9 % WEIGHT BASED INFUSION: 1.0000 mL/kg/h | INTRAVENOUS | Status: DC

## 2017-06-01 MED ORDER — HEPARIN SODIUM (PORCINE) 1000 UNIT/ML IJ SOLN
INTRAMUSCULAR | Status: AC
  Filled 2017-06-01: qty 1

## 2017-06-01 MED ORDER — SODIUM CHLORIDE 0.9% FLUSH: 3.0000 mL | Freq: Two times a day (BID) | INTRAVENOUS | Status: DC

## 2017-06-01 MED ORDER — SODIUM CHLORIDE 0.9% FLUSH: 3.0000 mL | INTRAVENOUS | Status: DC | PRN

## 2017-06-01 MED ORDER — IOPAMIDOL (ISOVUE-370) INJECTION 76%
INTRAVENOUS | Status: AC
  Filled 2017-06-01: qty 50

## 2017-06-01 MED ORDER — HEPARIN (PORCINE) IN NACL 2-0.9 UNIT/ML-% IJ SOLN
INTRAMUSCULAR | Status: AC | PRN
  Administered 2017-06-01: 1000 mL

## 2017-06-01 MED ORDER — MIDAZOLAM HCL 2 MG/2ML IJ SOLN
INTRAMUSCULAR | Status: DC | PRN
  Administered 2017-06-01: 2 mg via INTRAVENOUS

## 2017-06-01 MED ORDER — HYDRALAZINE HCL 20 MG/ML IJ SOLN
10.0000 mg | Freq: Once | INTRAMUSCULAR | Status: AC
  Administered 2017-06-01: 10 mg via INTRAVENOUS

## 2017-06-01 MED ORDER — MIDAZOLAM HCL 2 MG/2ML IJ SOLN
INTRAMUSCULAR | Status: AC
  Filled 2017-06-01: qty 2

## 2017-06-01 MED ORDER — SODIUM CHLORIDE 0.9 % WEIGHT BASED INFUSION
3.0000 mL/kg/h | INTRAVENOUS | Status: AC
  Administered 2017-06-01: 3 mL/kg/h via INTRAVENOUS

## 2017-06-01 MED ORDER — METFORMIN HCL 1000 MG PO TABS: 1000.0000 mg | ORAL_TABLET | Freq: Two times a day (BID) | ORAL | 1 refills | Status: DC

## 2017-06-01 MED ORDER — ASPIRIN 81 MG PO CHEW: 81.0000 mg | CHEWABLE_TABLET | ORAL | Status: DC

## 2017-06-01 MED ORDER — LIDOCAINE HCL 1 % IJ SOLN
INTRAMUSCULAR | Status: AC
  Filled 2017-06-01: qty 20

## 2017-06-01 MED ORDER — ACETAMINOPHEN 500 MG PO TABS
1000.0000 mg | ORAL_TABLET | Freq: Once | ORAL | Status: AC
  Administered 2017-06-01: 1000 mg via ORAL

## 2017-06-01 MED ORDER — HEPARIN (PORCINE) IN NACL 2-0.9 UNIT/ML-% IJ SOLN
INTRAMUSCULAR | Status: AC
  Filled 2017-06-01: qty 1000

## 2017-06-01 MED ORDER — OXYCODONE HCL 5 MG PO TABS
ORAL_TABLET | ORAL | Status: AC
  Administered 2017-06-01: 5 mg via ORAL
  Filled 2017-06-01: qty 1

## 2017-06-01 MED ORDER — LIDOCAINE HCL (PF) 1 % IJ SOLN
INTRAMUSCULAR | Status: DC | PRN
  Administered 2017-06-01: 2 mL via INTRADERMAL

## 2017-06-01 MED ORDER — VERAPAMIL HCL 2.5 MG/ML IV SOLN
INTRAVENOUS | Status: AC
  Filled 2017-06-01: qty 2

## 2017-06-01 MED ORDER — HYDRALAZINE HCL 20 MG/ML IJ SOLN
INTRAMUSCULAR | Status: AC
  Administered 2017-06-01: 10 mg via INTRAVENOUS
  Filled 2017-06-01: qty 1

## 2017-06-01 MED ORDER — IOPAMIDOL (ISOVUE-370) INJECTION 76%
INTRAVENOUS | Status: DC | PRN
  Administered 2017-06-01: 120 mL via INTRA_ARTERIAL

## 2017-06-01 MED ORDER — ACETAMINOPHEN 500 MG PO TABS
ORAL_TABLET | ORAL | Status: AC
  Administered 2017-06-01: 1000 mg via ORAL
  Filled 2017-06-01: qty 2

## 2017-06-01 MED ORDER — IOPAMIDOL (ISOVUE-370) INJECTION 76%
INTRAVENOUS | Status: AC
  Filled 2017-06-01: qty 100

## 2017-06-01 MED ORDER — FENTANYL CITRATE (PF) 100 MCG/2ML IJ SOLN
INTRAMUSCULAR | Status: AC
  Filled 2017-06-01: qty 2

## 2017-06-01 MED ORDER — HEPARIN SODIUM (PORCINE) 1000 UNIT/ML IJ SOLN
INTRAMUSCULAR | Status: DC | PRN
  Administered 2017-06-01: 6000 [IU] via INTRAVENOUS

## 2017-06-01 MED ORDER — SODIUM CHLORIDE 0.9 % IV SOLN: 250.0000 mL | INTRAVENOUS | Status: DC | PRN

## 2017-06-01 MED ORDER — OXYCODONE HCL 5 MG PO TABS
5.0000 mg | ORAL_TABLET | Freq: Once | ORAL | Status: AC
  Administered 2017-06-01: 5 mg via ORAL

## 2017-06-01 MED ORDER — SODIUM CHLORIDE 0.9 % IV SOLN: INTRAVENOUS | Status: DC

## 2017-06-01 MED ORDER — FENTANYL CITRATE (PF) 100 MCG/2ML IJ SOLN
INTRAMUSCULAR | Status: DC | PRN
  Administered 2017-06-01: 25 ug via INTRAVENOUS

## 2017-06-01 MED ORDER — VERAPAMIL HCL 2.5 MG/ML IV SOLN
INTRAVENOUS | Status: DC | PRN
  Administered 2017-06-01: 10 mL via INTRA_ARTERIAL

## 2017-06-01 SURGICAL SUPPLY — 11 items
CATH 5FR JL3.5 JR4 ANG PIG MP (CATHETERS) ×1 IMPLANT
CATH INFINITI 5 FR 3DRC (CATHETERS) ×1 IMPLANT
DEVICE RAD COMP TR BAND LRG (VASCULAR PRODUCTS) ×1 IMPLANT
GLIDESHEATH SLEND SS 6F .021 (SHEATH) ×1 IMPLANT
GUIDEWIRE INQWIRE 1.5J.035X260 (WIRE) IMPLANT
INQWIRE 1.5J .035X260CM (WIRE) ×2
KIT HEART LEFT (KITS) ×2 IMPLANT
PACK CARDIAC CATHETERIZATION (CUSTOM PROCEDURE TRAY) ×2 IMPLANT
SYR MEDRAD MARK V 150ML (SYRINGE) ×1 IMPLANT
TRANSDUCER W/STOPCOCK (MISCELLANEOUS) ×2 IMPLANT
TUBING CIL FLEX 10 FLL-RA (TUBING) ×2 IMPLANT

## 2017-06-01 NOTE — H&P (View-Only) (Signed)
SUBJECTIVE: The patient returns for follow-up after undergoing cardiovascular testing performed for the evaluation of abnormal ECG and CHF.  Nuclear stress test showed a small area of inferoapical ischemia, calculated LVEF 42%. There was an elevated tid of 1.37 which may represent balanced ischemia and increased cardiac risk. Overall, it was deemed a high risk study.  He denies chest pain. His chronic exertional dyspnea is stable. He has not been taking Lasix.     Review of Systems: As per "subjective", otherwise negative.  Allergies  Allergen Reactions  . Morphine And Related     Heart beat fast   . Nicotine     Rash from the patch     Current Outpatient Prescriptions  Medication Sig Dispense Refill  . atorvastatin (LIPITOR) 40 MG tablet TAKE 1 TABLET BY MOUTH DAILY 30 tablet 0  . blood glucose meter kit and supplies KIT Dispense based on patient and insurance preference. Use up to four times daily as directed. (FOR ICD-9 250.00, 250.01). 1 each 0  . carvedilol (COREG) 25 MG tablet TAKE 1 TABLET BY MOUTH TWICE DAILY 60 tablet 0  . furosemide (LASIX) 20 MG tablet Take 1 tablet (20 mg total) by mouth 2 (two) times daily. 180 tablet 1  . hydrALAZINE (APRESOLINE) 100 MG tablet Take 1 tablet (100 mg total) by mouth 3 (three) times daily. 180 tablet 1  . hydrochlorothiazide (HYDRODIURIL) 25 MG tablet Take 1 tablet (25 mg total) by mouth daily. 30 tablet 1  . metFORMIN (GLUCOPHAGE) 1000 MG tablet Take 1 tablet (1,000 mg total) by mouth 2 (two) times daily. 180 tablet 1  . sacubitril-valsartan (ENTRESTO) 24-26 MG Take 1 tablet by mouth 2 (two) times daily. 60 tablet 6   No current facility-administered medications for this visit.     Past Medical History:  Diagnosis Date  . Asthma   . CHF (congestive heart failure) ([place])   . COPD (chronic obstructive pulmonary disease) ([place])   . Diabetes mellitus without complication ([place])   . HLD (hyperlipidemia) 08/11/2016  . Hypertension    . Obesity   . Oxygen deficiency   . Premature baby   . Sleep apnea     Past Surgical History:  Procedure Laterality Date  . TONSILLECTOMY      Social History   Social History  . Marital status: Single    Spouse name: N/A  . Number of children: 0  . Years of education: 10   Occupational History  .      disability   Social History Main Topics  . Smoking status: Former Smoker    Packs/day: 0.25    Types: Cigarettes    Start date: 10/12/1997    Quit date: 02/10/2016  . Smokeless tobacco: Never Used  . Alcohol use No  . Drug use: No  . Sexual activity: Not Currently   Other Topics Concern  . Not on file   Social History Narrative   Lives with mother and step father   disabled     Vitals:   05/17/17 1348  BP: (!) 160/92  Pulse: 76  SpO2: 96%  Weight: (!) 380 lb (172.4 kg)  Height: _0  (1.753 m)    Wt Readings from Last 3 Encounters:  05/17/17 (!) 380 lb (172.4 kg)  01/07/17 (!) 394 lb (178.7 kg)  12/15/16 (!) 389 lb 1.9 oz (176.5 kg)     PHYSICAL EXAM General: NAD HEENT: Normal. Neck: Due to body habitus, unable to assess JVP. Lungs:  Diminished throughout, no rales or wheezes. CV: Nondisplaced PMI.  Regular rate and rhythm, normal S1/S2, no S3/S4, no murmur.  Brawny trace pretibial bilateral edema Abdomen: Obese. Neurologic: Alert and oriented.  Psych: Normal affect. Skin: Normal. Musculoskeletal: No gross deformities.    ECG: Most recent ECG reviewed.   Labs: Lab Results  Component Value Date/Time   K 4.6 12/15/2016 04:02 PM   BUN 11 12/15/2016 04:02 PM   CREATININE 0.89 12/15/2016 04:02 PM   ALT 13 12/15/2016 04:02 PM   TSH 1.86 12/15/2016 04:02 PM   HGB 11.7 (L) 12/15/2016 04:02 PM     Lipids: Lab Results  Component Value Date/Time   LDLCALC 87 12/15/2016 04:02 PM   CHOL 134 12/15/2016 04:02 PM   TRIG 115 12/15/2016 04:02 PM   HDL 24 (L) 12/15/2016 04:02 PM       ASSESSMENT AND PLAN:  1. Abnormal ECG and abnormal  nuclear stress test: I will arrange for coronary angiography. Continue Coreg and Lipitor. Risks and benefits of cardiac catheterization have been discussed with the patient.  These include bleeding, infection, kidney damage, stroke, heart attack, death.  The patient understands these risks and is willing to proceed.  2. Malignant hypertension: Elevated. He takes Coreg 25 mg twice daily, hydralazine 100 mg 3 times daily, Entresto 24/26 mg twice daily, and hydrochlorothiazide 25 mg daily. I will increase Entresto to 49/51 mg bid.  3. Chronic kidney disease stage III: Stable. BUN 11, creatinine 0.89 on 12/15/16. I will check a basic metabolic panel within the next week.  4. Sleep apnea: On BiPAP.  5. Chronic systolic heart failure, LVEF 30-35%: Continue Coreg. He has not been taking Lasix. I encouraged him to take 40 mg daily. I will increase Entresto to 49/51 mg bid. I will arrange for coronary angiography to assess for an ischemic etiology.  I will check a basic metabolic panel within the next week.   6. Chronic hypoxic respiratory failure: Uses 4 L of oxygen chronically since May 2017. I previously made a pulmonary referral. He uses CPAP for OSA.     Disposition: Follow up 6 weeks.  Time spent: 40 minutes, of which greater than 50% was spent reviewing symptoms, relevant blood tests and studies, and discussing management plan with the patient.    Kate Sable, M.D., F.A.C.C.

## 2017-06-01 NOTE — Progress Notes (Signed)
Sophronia Simas notified of B/P and cont c/o pain and orders noted

## 2017-06-01 NOTE — Interval H&P Note (Signed)
Cath Lab Visit (complete for each Cath Lab visit)  Clinical Evaluation Leading to the Procedure:   ACS: No.  Non-ACS:    Anginal Classification: CCS II  Anti-ischemic medical therapy: Minimal Therapy (1 class of medications)  Non-Invasive Test Results: Intermediate-risk stress test findings: cardiac mortality 1-3%/year  Prior CABG: No previous CABG      History and Physical Interval Note:  06/01/2017 12:50 PM  Ross Schmidt  has presented today for surgery, with the diagnosis of abnormal stress test  The various methods of treatment have been discussed with the patient and family. After consideration of risks, benefits and other options for treatment, the patient has consented to  Procedure(s): LEFT HEART CATH AND CORONARY ANGIOGRAPHY (N/A) as a surgical intervention .  The patient's history has been reviewed, patient examined, no change in status, stable for surgery.  I have reviewed the patient's chart and labs.  Questions were answered to the patient's satisfaction.     Lance Muss

## 2017-06-01 NOTE — Discharge Instructions (Signed)
NO METFORMIN/GLUCOPHAGE FOR 2 DAYS ° ° °Radial Site Care °Refer to this sheet in the next few weeks. These instructions provide you with information about caring for yourself after your procedure. Your health care provider may also give you more specific instructions. Your treatment has been planned according to current medical practices, but problems sometimes occur. Call your health care provider if you have any problems or questions after your procedure. °What can I expect after the procedure? °After your procedure, it is typical to have the following: °· Bruising at the radial site that usually fades within 1-2 weeks. °· Blood collecting in the tissue (hematoma) that may be painful to the touch. It should usually decrease in size and tenderness within 1-2 weeks. ° °Follow these instructions at home: °· Take medicines only as directed by your health care provider. °· You may shower 24-48 hours after the procedure or as directed by your health care provider. Remove the bandage (dressing) and gently wash the site with plain soap and water. Pat the area dry with a clean towel. Do not rub the site, because this may cause bleeding. °· Do not take baths, swim, or use a hot tub until your health care provider approves. °· Check your insertion site every day for redness, swelling, or drainage. °· Do not apply powder or lotion to the site. °· Do not flex or bend the affected arm for 24 hours or as directed by your health care provider. °· Do not push or pull heavy objects with the affected arm for 24 hours or as directed by your health care provider. °· Do not lift over 10 lb (4.5 kg) for 5 days after your procedure or as directed by your health care provider. °· Ask your health care provider when it is okay to: °? Return to work or school. °? Resume usual physical activities or sports. °? Resume sexual activity. °· Do not drive home if you are discharged the same day as the procedure. Have someone else drive you. °· You  may drive 24 hours after the procedure unless otherwise instructed by your health care provider. °· Do not operate machinery or power tools for 24 hours after the procedure. °· If your procedure was done as an outpatient procedure, which means that you went home the same day as your procedure, a responsible adult should be with you for the first 24 hours after you arrive home. °· Keep all follow-up visits as directed by your health care provider. This is important. °Contact a health care provider if: °· You have a fever. °· You have chills. °· You have increased bleeding from the radial site. Hold pressure on the site. °Get help right away if: °· You have unusual pain at the radial site. °· You have redness, warmth, or swelling at the radial site. °· You have drainage (other than a small amount of blood on the dressing) from the radial site. °· The radial site is bleeding, and the bleeding does not stop after 30 minutes of holding steady pressure on the site. °· Your arm or hand becomes pale, cool, tingly, or numb. °This information is not intended to replace advice given to you by your health care provider. Make sure you discuss any questions you have with your health care provider. °Document Released: 10/31/2010 Document Revised: 03/05/2016 Document Reviewed: 04/16/2014 °Elsevier Interactive Patient Education © 2018 [employer] Inc[employer] ° °

## 2017-06-02 ENCOUNTER — Encounter (HOSPITAL_COMMUNITY): Payer: Self-pay | Admitting: Interventional Cardiology

## 2017-06-02 MED FILL — Lidocaine HCl Local Inj 1%: INTRAMUSCULAR | Qty: 20 | Status: AC

## 2017-06-15 ENCOUNTER — Other Ambulatory Visit: Payer: Self-pay | Admitting: Cardiovascular Disease

## 2017-06-15 MED ORDER — SACUBITRIL-VALSARTAN 49-51 MG PO TABS: 1.0000 | ORAL_TABLET | Freq: Two times a day (BID) | ORAL | 0 refills | Status: DC

## 2017-06-15 NOTE — Telephone Encounter (Signed)
Patient called stating that he has run out of his Altamese Cabal and has not taken the last couple of days due to refill being over $500 when he went to pick it up

## 2017-06-15 NOTE — Telephone Encounter (Signed)
Call placed to pharm [facility]) - stated patient just picked up #60 (30 day supply) on 05/26/2017 and paid $3.00.  Stated that he spoke with patient & mother as well & tried to explain to them that the refill is too soon.  That is the reason it is not going though system and $500.00 cost is coming out.  ([place] Medicaid)  Call placed to patient - again reiterated the explanation from the pharmacy.  Stated he is out now & has not lost any tabs.    Samples will be given to get through till next refill, will be available around 06/26/17.     Patient verbalized understanding & will pick up today.

## 2017-06-23 ENCOUNTER — Ambulatory Visit: Payer: Self-pay | Admitting: Family Medicine

## 2017-07-01 ENCOUNTER — Encounter: Payer: Self-pay | Admitting: Cardiovascular Disease

## 2017-07-01 ENCOUNTER — Ambulatory Visit (INDEPENDENT_AMBULATORY_CARE_PROVIDER_SITE_OTHER): Payer: Medicaid Other | Admitting: Cardiovascular Disease

## 2017-07-01 VITALS — BP 184/100 | HR 83 | Ht 69.0 in | Wt 366.8 lb

## 2017-07-01 DIAGNOSIS — I429 Cardiomyopathy, unspecified: Secondary | ICD-10-CM | POA: Diagnosis not present

## 2017-07-01 DIAGNOSIS — I2510 Atherosclerotic heart disease of native coronary artery without angina pectoris: Secondary | ICD-10-CM | POA: Diagnosis not present

## 2017-07-01 DIAGNOSIS — I10 Essential (primary) hypertension: Secondary | ICD-10-CM

## 2017-07-01 DIAGNOSIS — Z9114 Patient's other noncompliance with medication regimen: Secondary | ICD-10-CM | POA: Diagnosis not present

## 2017-07-01 DIAGNOSIS — I5022 Chronic systolic (congestive) heart failure: Secondary | ICD-10-CM

## 2017-07-01 MED ORDER — FUROSEMIDE 40 MG PO TABS: 40.0000 mg | ORAL_TABLET | ORAL | Status: DC | PRN

## 2017-07-01 MED ORDER — HYDRALAZINE HCL 100 MG PO TABS: 100.0000 mg | ORAL_TABLET | Freq: Three times a day (TID) | ORAL | 3 refills | Status: DC

## 2017-07-01 NOTE — Patient Instructions (Addendum)
Medication Instructions:   Hydralazine refilled today.   Change Lasix to as needed.    Continue all other medications.    Labwork: none  Testing/Procedures: none  Follow-Up: Your physician wants you to follow up in:  4 months.  You will receive a reminder letter in the mail one-two months in advance.  If you don't receive a letter, please call our office to schedule the follow up appointment   Any Other Special Instructions Will Be Listed Below (If Applicable). Work on weight loss.    If you need a refill on your cardiac medications before your next appointment, please call your pharmacy.

## 2017-07-01 NOTE — Progress Notes (Signed)
SUBJECTIVE: The patient presents for follow-up after undergoing coronary angiography, which demonstrated mild nonobstructive disease. There was a mid LAD lesion of 25%. LVEF by visual estimate was 35-45%. LVEDP was moderately elevated, 29 mmHg.  He denies chest pain. He has chronic stable exertional dyspnea. He denies leg swelling.  It appears he has not been taking hydralazine for at least a month. He hasn't needed Lasix in over a month as well.    Review of Systems: As per "subjective", otherwise negative.  Allergies  Allergen Reactions  . Morphine And Related     Heart beat fast   . Nicoderm [Nicotine]     Rash from the patch     Current Outpatient Prescriptions  Medication Sig Dispense Refill  . atorvastatin (LIPITOR) 40 MG tablet TAKE 1 TABLET BY MOUTH DAILY 30 tablet 0  . blood glucose meter kit and supplies KIT Dispense based on patient and insurance preference. Use up to four times daily as directed. (FOR ICD-9 250.00, 250.01). 1 each 0  . carvedilol (COREG) 25 MG tablet TAKE 1 TABLET BY MOUTH TWICE DAILY 60 tablet 0  . furosemide (LASIX) 40 MG tablet Take 1 tablet (40 mg total) by mouth every morning. 90 tablet 3  . hydrALAZINE (APRESOLINE) 100 MG tablet Take 1 tablet (100 mg total) by mouth 3 (three) times daily. 180 tablet 1  . metFORMIN (GLUCOPHAGE) 1000 MG tablet Take 1 tablet (1,000 mg total) by mouth 2 (two) times daily. 180 tablet 1  . sacubitril-valsartan (ENTRESTO) 49-51 MG Take 1 tablet by mouth 2 (two) times daily. 28 tablet 0   No current facility-administered medications for this visit.     Past Medical History:  Diagnosis Date  . Asthma   . CHF (congestive heart failure) ([place])   . COPD (chronic obstructive pulmonary disease) ([place])   . Diabetes mellitus without complication ([place])   . HLD (hyperlipidemia) 08/11/2016  . Hypertension   . Obesity   . Oxygen deficiency   . Premature baby   . Sleep apnea     Past Surgical History:  Procedure  Laterality Date  . LEFT HEART CATH AND CORONARY ANGIOGRAPHY N/A 06/01/2017   Procedure: LEFT HEART CATH AND CORONARY ANGIOGRAPHY;  Surgeon: Jettie Booze, MD;  Location: [facility];  Service: Cardiovascular;  Laterality: N/A;  . TONSILLECTOMY      Social History   Social History  . Marital status: Single    Spouse name: N/A  . Number of children: 0  . Years of education: 10   Occupational History  .      disability   Social History Main Topics  . Smoking status: Former Smoker    Packs/day: 0.25    Types: Cigarettes    Start date: 10/12/1997    Quit date: 02/10/2016  . Smokeless tobacco: Never Used  . Alcohol use No  . Drug use: No  . Sexual activity: Not Currently   Other Topics Concern  . Not on file   Social History Narrative   Lives with mother and step father   disabled     Vitals:   07/01/17 1404  BP: (!) 184/100  Pulse: 83  SpO2: 99%  Weight: (!) 366 lb 12.8 oz (166.4 kg)  Height: _0  (1.753 m)    Wt Readings from Last 3 Encounters:  07/01/17 (!) 366 lb 12.8 oz (166.4 kg)  06/01/17 (!) 375 lb (170.1 kg)  05/17/17 (!) 380 lb (172.4 kg)  PHYSICAL EXAM General: NAD HEENT: Normal. Neck: Due to body habitus, unable to assess JVP. Lungs: Diminished throughout, no rales or wheezes. CV: Nondisplaced PMI. Regular rate and rhythm, normal S1/S2, no S3/S4, no murmur.  Brawnytrace pretibial bilateral edema Abdomen: Obese. Neurologic: Alert and oriented.  Psych: Normal affect. Skin: Normal. Musculoskeletal: No gross deformities.    ECG: Most recent ECG reviewed.   Labs: Lab Results  Component Value Date/Time   K 4.5 06/01/2017 11:29 AM   BUN 12 06/01/2017 11:29 AM   CREATININE 0.80 06/01/2017 11:29 AM   CREATININE 0.89 12/15/2016 04:02 PM   ALT 13 12/15/2016 04:02 PM   TSH 1.86 12/15/2016 04:02 PM   HGB 10.8 (L) 06/01/2017 11:29 AM     Lipids: Lab Results  Component Value Date/Time   LDLCALC 87 12/15/2016 04:02 PM   CHOL  134 12/15/2016 04:02 PM   TRIG 115 12/15/2016 04:02 PM   HDL 24 (L) 12/15/2016 04:02 PM       ASSESSMENT AND PLAN:  1. CAD: Nonobstructive. Coronary angiography details noted above . Continue Coreg and Lipitor.   2. Malignant hypertension: Elevated. He takes Coreg 25 mg twice daily and Entresto 49/51 mg twice daily. He has not taken hydralazine in over a month. I will refill 100 mg 3 times daily. I also encouraged weight loss.  3. Chronic kidney disease: Stable. BUN 12, creatinine 0.8 on 06/01/17. This actually correlates with a normal GFR  4. Sleep apnea: On BiPAP.  5. Chronic systolic heart failure, LVEF 30-35%:Continue Coreg and Entresto 49/51 mg bid. I will aim to control blood pressure. I encouraged weight loss. I told him to weigh himself daily and should he develop bilateral leg edema or increasing shortness of breath with a weight gain of 3 pounds in 24 hours, to take Lasix.  6. Chronic hypoxic respiratory failure: Uses 4 L of oxygen chronically since May 2017. I previously made a pulmonary referral. He uses CPAP for OSA.     Disposition: Follow up 4 months.   Kate Sable, M.D., F.A.C.C.

## 2017-07-14 ENCOUNTER — Other Ambulatory Visit: Payer: Self-pay | Admitting: Family Medicine

## 2017-08-25 ENCOUNTER — Other Ambulatory Visit: Payer: Self-pay | Admitting: Family Medicine

## 2017-09-17 ENCOUNTER — Other Ambulatory Visit: Payer: Self-pay | Admitting: Family Medicine

## 2017-10-22 ENCOUNTER — Other Ambulatory Visit: Payer: Self-pay | Admitting: Cardiovascular Disease

## 2017-11-04 ENCOUNTER — Ambulatory Visit: Payer: Medicaid Other | Admitting: Cardiovascular Disease

## 2017-11-09 ENCOUNTER — Ambulatory Visit (INDEPENDENT_AMBULATORY_CARE_PROVIDER_SITE_OTHER): Payer: Medicaid Other | Admitting: Cardiovascular Disease

## 2017-11-09 ENCOUNTER — Encounter: Payer: Self-pay | Admitting: Cardiovascular Disease

## 2017-11-09 VITALS — BP 122/90 | HR 88 | Ht 69.0 in | Wt 390.0 lb

## 2017-11-09 DIAGNOSIS — I2510 Atherosclerotic heart disease of native coronary artery without angina pectoris: Secondary | ICD-10-CM | POA: Diagnosis not present

## 2017-11-09 DIAGNOSIS — G4733 Obstructive sleep apnea (adult) (pediatric): Secondary | ICD-10-CM | POA: Diagnosis not present

## 2017-11-09 DIAGNOSIS — I429 Cardiomyopathy, unspecified: Secondary | ICD-10-CM | POA: Diagnosis not present

## 2017-11-09 DIAGNOSIS — I5022 Chronic systolic (congestive) heart failure: Secondary | ICD-10-CM | POA: Diagnosis not present

## 2017-11-09 DIAGNOSIS — I10 Essential (primary) hypertension: Secondary | ICD-10-CM

## 2017-11-09 NOTE — Progress Notes (Signed)
SUBJECTIVE: The patient presents for follow-up of chronic systolic heart failure and malignant hypertension.  He chronically uses 4 L of oxygen.  He denies chest pain, palpitations, and leg swelling.  Chronic exertional dyspnea is stable.  He has not had to use Lasix recently.    Review of Systems: As per "subjective", otherwise negative.  Allergies  Allergen Reactions  . Morphine And Related     Heart beat fast   . Nicoderm [Nicotine]     Rash from the patch     Current Outpatient Medications  Medication Sig Dispense Refill  . atorvastatin (LIPITOR) 40 MG tablet TAKE ONE TABLET BY MOUTH EVERY DAY 90 tablet 1  . blood glucose meter kit and supplies KIT Dispense based on patient and insurance preference. Use up to four times daily as directed. (FOR ICD-9 250.00, 250.01). 1 each 0  . carvedilol (COREG) 25 MG tablet TAKE ONE TABLET BY MOUTH TWICE DAILY WITH FOOD 180 tablet 1  . furosemide (LASIX) 40 MG tablet Take 1 tablet (40 mg total) by mouth as needed.    . hydrALAZINE (APRESOLINE) 100 MG tablet Take 1 tablet (100 mg total) by mouth 3 (three) times daily. 270 tablet 3  . metFORMIN (GLUCOPHAGE) 1000 MG tablet Take 1 tablet (1,000 mg total) by mouth 2 (two) times daily. 180 tablet 1  . sacubitril-valsartan (ENTRESTO) 49-51 MG Take 1 tablet by mouth 2 (two) times daily. 28 tablet 0   No current facility-administered medications for this visit.     Past Medical History:  Diagnosis Date  . Asthma   . CHF (congestive heart failure) ([place])   . COPD (chronic obstructive pulmonary disease) ([place])   . Diabetes mellitus without complication ([place])   . HLD (hyperlipidemia) 08/11/2016  . Hypertension   . Obesity   . Oxygen deficiency   . Premature baby   . Sleep apnea     Past Surgical History:  Procedure Laterality Date  . LEFT HEART CATH AND CORONARY ANGIOGRAPHY N/A 06/01/2017   Procedure: LEFT HEART CATH AND CORONARY ANGIOGRAPHY;  Surgeon: Jettie Booze, MD;  Location:  [facility];  Service: Cardiovascular;  Laterality: N/A;  . TONSILLECTOMY      Social History   Socioeconomic History  . Marital status: Single    Spouse name: Not on file  . Number of children: 0  . Years of education: 10  . Highest education level: Not on file  Social Needs  . Financial resource strain: Not on file  . Food insecurity - worry: Not on file  . Food insecurity - inability: Not on file  . Transportation needs - medical: Not on file  . Transportation needs - non-medical: Not on file  Occupational History    Comment: disability  Tobacco Use  . Smoking status: Former Smoker    Packs/day: 0.25    Types: Cigarettes    Start date: 10/12/1997    Last attempt to quit: 02/10/2016    Years since quitting: 1.7  . Smokeless tobacco: Never Used  Substance and Sexual Activity  . Alcohol use: No  . Drug use: No  . Sexual activity: Not Currently  Other Topics Concern  . Not on file  Social History Narrative   Lives with mother and step father   disabled     Vitals:   11/09/17 0810  BP: 122/90  Pulse: 88  SpO2: 98%  Weight: (!) 390 lb (176.9 kg)  Height: '5\' 9"'  (1.753 m)  Wt Readings from Last 3 Encounters:  11/09/17 (!) 390 lb (176.9 kg)  07/01/17 (!) 366 lb 12.8 oz (166.4 kg)  06/01/17 (!) 375 lb (170.1 kg)     PHYSICAL EXAM General: NAD HEENT: Normal. Neck: Unable to assess JVP due to body habitus Lungs: Diminished throughout, no crackles or wheezes. CV: Regular rate and rhythm, normal S1/S2, no S3/S4, no murmur.  Brawny trace bilateral lower extremity edema  Abdomen: Morbidly obese Neurologic: Alert and oriented.  Psych: Normal affect. Skin: Normal. Musculoskeletal: No gross deformities.    ECG: Most recent ECG reviewed.   Labs: Lab Results  Component Value Date/Time   K 4.5 06/01/2017 11:29 AM   BUN 12 06/01/2017 11:29 AM   CREATININE 0.80 06/01/2017 11:29 AM   CREATININE 0.89 12/15/2016 04:02 PM   ALT 13 12/15/2016 04:02 PM   TSH  1.86 12/15/2016 04:02 PM   HGB 10.8 (L) 06/01/2017 11:29 AM     Lipids: Lab Results  Component Value Date/Time   LDLCALC 87 12/15/2016 04:02 PM   CHOL 134 12/15/2016 04:02 PM   TRIG 115 12/15/2016 04:02 PM   HDL 24 (L) 12/15/2016 04:02 PM       ASSESSMENT AND PLAN:  1. Malignant hypertension: Blood pressure is better controlled than last visit.  Emphasized the importance of physical activity and weight loss.He takes Coreg 25 mg twice daily and Entresto 49/51 mg twice along with hydralazine 100 mg 3 times daily.   2. Chronic kidney disease: Stable. BUN 12, creatinine 0.8 on 06/01/17. This actually correlates with a normal GFR  3. Sleep apnea: On BiPAP.  4. Chronic systolic heart failure, LVEF 30-35%:Symptomatically stable.  Continue Coreg and Entresto 49/51 mg bid. I encouraged weight loss. I told him to weigh himself daily and should he develop bilateral leg edema or increasing shortness of breath with a weight gain of 3 pounds in 24 hours, to take Lasix. I will reassess left ventricular systolic function at the time of his next visit.  If LVEF remains severely reduced, I would consider an EP referral for AICD.  QRS duration 106 ms on 06/01/17; thus, not a candidate for cardiac resynchronization therapy.  6. Chronic hypoxic respiratory failure: Uses 4 L of oxygen chronically since May 2017. I previously made a pulmonary referral. He uses CPAP for OSA.      Disposition: Follow up 6 months   Kate Sable, M.D., F.A.C.C.

## 2017-11-09 NOTE — Patient Instructions (Signed)

## 2017-11-17 ENCOUNTER — Telehealth: Payer: Self-pay | Admitting: Nutrition

## 2017-11-17 ENCOUNTER — Ambulatory Visit: Payer: Medicaid Other | Admitting: Nutrition

## 2017-11-17 NOTE — Telephone Encounter (Signed)
Unable to leave vm to reschedule missed appt.

## 2017-11-20 IMAGING — NM NM MYOCAR MULTI W/SPECT W/WALL MOTION & EF
2 series · 12 of 12 positions shown · non-contrast
Comparison: none

[Series 1: rest · 6.51mm/px · 6 of 64 frames shown]
[frame 6/64]
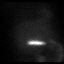
[frame 16/64]
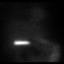
[frame 27/64]
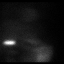
[frame 38/64]
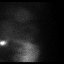
[frame 48/64]
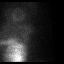
[frame 59/64]
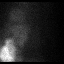

[Series 2: stress gated · 6.51mm/px · 6 of 64 frames shown]
[frame 6/64]
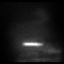
[frame 16/64]
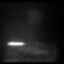
[frame 27/64]
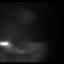
[frame 38/64]
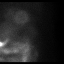
[frame 48/64]
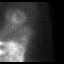
[frame 59/64]
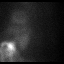

[12 of 12 positions shown; findings below may reference images not displayed]

Canned report from images found in remote index.

Refer to host system for actual result text.

## 2017-12-02 ENCOUNTER — Ambulatory Visit: Payer: Medicaid Other | Admitting: Cardiovascular Disease

## 2017-12-15 ENCOUNTER — Telehealth: Payer: Self-pay | Admitting: Nutrition

## 2017-12-15 ENCOUNTER — Ambulatory Visit: Payer: Medicaid Other | Admitting: Nutrition

## 2017-12-15 NOTE — Telephone Encounter (Signed)
Left message with family member to have pt call to reschedule missed appt.

## 2018-03-15 ENCOUNTER — Telehealth: Payer: Self-pay | Admitting: *Deleted

## 2018-03-15 NOTE — Telephone Encounter (Signed)
[place] Medicaid approved Entresto - $3.00 co-pay.  Patient notified via detailed voice message.

## 2018-04-12 ENCOUNTER — Ambulatory Visit: Payer: Medicaid Other | Admitting: Pulmonary Disease

## 2018-04-25 ENCOUNTER — Encounter: Payer: Self-pay | Admitting: Pulmonary Disease

## 2018-04-25 ENCOUNTER — Ambulatory Visit (INDEPENDENT_AMBULATORY_CARE_PROVIDER_SITE_OTHER): Payer: Medicaid Other | Admitting: Pulmonary Disease

## 2018-04-25 VITALS — BP 142/88 | HR 102 | Ht 69.0 in | Wt 387.6 lb

## 2018-04-25 DIAGNOSIS — I509 Heart failure, unspecified: Secondary | ICD-10-CM | POA: Diagnosis not present

## 2018-04-25 DIAGNOSIS — G4733 Obstructive sleep apnea (adult) (pediatric): Secondary | ICD-10-CM | POA: Diagnosis not present

## 2018-04-25 DIAGNOSIS — J989 Respiratory disorder, unspecified: Secondary | ICD-10-CM | POA: Diagnosis not present

## 2018-04-25 DIAGNOSIS — R0609 Other forms of dyspnea: Secondary | ICD-10-CM | POA: Diagnosis not present

## 2018-04-25 NOTE — Patient Instructions (Signed)
Continue BiPAP . Keep up the hard work using your device.  . Do not drive or operate heavy machinery if tired or drowsy.  . Please notify the supply company and office if you are unable to use your device regularly due to missing supplies or machine being broken.  . Work on maintaining a healthy weight and following your recommended nutrition plan  . Maintain proper daily exercise and movement  . Maintaining proper use of your device can also help improve management of other chronic illnesses such as: Blood pressure, blood sugars, and weight management.    BiPAP Cleaning:  Clean weekly, with Dawn soap, and bottle brush.  Set up to air dry.  Will place order for SPO2 monitor from DME company, unlikely that they will be covered.  You can purchase her own from local pharmacy.  Need to be checking oxygen saturations to ensure there greater than 90%.  Okay to trial coming off of oxygen during the day if oxygen saturations remain greater than 90%.  Continue to work aggressively on diet and exercise  Speak to cardiology about referral to nutritionist  Follow-up with Dr. Sherene Sires in 6 to 8 weeks  Please contact the office if your symptoms worsen or you have concerns that you are not improving.   Thank you for choosing [facility] for your healthcare, and for allowing [place] to partner with you on your healthcare journey. I am thankful to be able to provide care to you today.   Elisha Headland FNP-C

## 2018-04-25 NOTE — Progress Notes (Signed)
_0  ID: Ross Schmidt, male    DOB: 08-20-1986, 32 y.o.   MRN: [id number]  Chief Complaint  Patient presents with  . Follow-up    States he would like to come off oxygen. Denies chest pain, chest tightness, wheezing, denies cough.     Referring provider: Celene Squibb, MD  HPI: 32 year old male former smoker (quit 02/2016).  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  Since 2003 patient has gained over 100 pounds. PMH: Morbid obesity, congestive heart failure with reduced ejection fraction (30 to 35%) - Pt on Entresto. Followed by Cardiology.  Patient of Dr. Melvyn Novas  Recent [facility] Encounters:   10/07/2016 1st [facility] office visit/ [facility]   Chief Complaint  Patient presents with  . Pulmonary Consult    Referred by: Dr. Meda Coffee: SOB, uses BIPAP nightly d/t Sleep apnea    on 02 24h / day since may 2017 / followed by Dr Felton Clinton in [place] on bipap Doe = MMRC1 = can walk nl pace, flat grade, can't hurry or go uphills or steps s sob  On 02 // can't do it without 02  No obvious day to day or daytime variability or assoc excess/ purulent sputum or mucus plugs or hemoptysis or cp or chest tightness, subjective wheeze or overt sinus or hb symptoms. No unusual exp hx or h/o childhood pna/ asthma or knowledge of premature birth. Sleeping ok without nocturnal  or early am exacerbation  of respiratory  c/o's or need for noct saba. Also denies any obvious fluctuation of symptoms with weather or environmental changes or other aggravating or alleviating factors except as outlined above   Tests:  10/07/2016- spirometry-severe restriction 09/08/2016-echocardiogram- LV ejection fraction 30 to 35%  Imaging:   Cardiac:   Labs:   Micro:   Chart Review:  Patient has close follow-up with cardiology.    04/25/18 OV 32 year old patient of Dr. Melvyn Novas seen in office today for  follow-up.  Patient reports he has been doing well but wants to stop using his oxygen.  Patient is unable to check oxygen saturations at home.  Patient reports that he has been actively walking more and trying to lose weight.  Patient has close follow-up with cardiology.  Patient is taking Entresto.  Compliance report today showing 19 out of 30 days used.  With 15 days of those greater than 4 hours use.  Average usage days 6 hours and 17 minutes.  AHI 4.6.  Patient reports no concerns with using the device.  BiPAP pressure is a little high IPAP 25 cm H2O, EPAP 20 cm H2O.  We do not have a sleep study on file.  Patient unsure where he had a sleep study completed at.  We will work on getting this data.   Allergies  Allergen Reactions  . Morphine And Related     Heart beat fast   . Nicoderm [Nicotine]     Rash from the patch     Immunization History  Administered Date(s) Administered  . H1N1 07/26/2017  . Influenza,inj,Quad PF,6+ Mos 08/25/2016  . Pneumococcal Conjugate-13 08/25/2016    Past Medical History:  Diagnosis Date  . Asthma   . CHF (congestive heart failure) ([place])   . COPD (chronic obstructive pulmonary disease) ([place])   . Diabetes mellitus without complication ([place])   . HLD (hyperlipidemia) 08/11/2016  . Hypertension   . Obesity   .  Oxygen deficiency   . Premature baby   . Sleep apnea     Tobacco History: Social History   Tobacco Use  Smoking Status Former Smoker  . Packs/day: 0.25  . Types: Cigarettes  . Start date: 10/12/1997  . Last attempt to quit: 02/10/2016  . Years since quitting: 2.2  Smokeless Tobacco Never Used   Counseling given: Yes Continue to avoid smoking.  Outpatient Encounter Medications as of 04/25/2018  Medication Sig  . atorvastatin (LIPITOR) 40 MG tablet TAKE ONE TABLET BY MOUTH EVERY DAY  . blood glucose meter kit and supplies KIT Dispense based on patient and insurance preference. Use up to four times daily as directed. (FOR ICD-9  250.00, 250.01).  . carvedilol (COREG) 25 MG tablet TAKE ONE TABLET BY MOUTH TWICE DAILY WITH FOOD  . furosemide (LASIX) 40 MG tablet Take 1 tablet (40 mg total) by mouth as needed.  . hydrALAZINE (APRESOLINE) 100 MG tablet Take 1 tablet (100 mg total) by mouth 3 (three) times daily.  . metFORMIN (GLUCOPHAGE) 1000 MG tablet Take 1 tablet (1,000 mg total) by mouth 2 (two) times daily.  . sacubitril-valsartan (ENTRESTO) 49-51 MG Take 1 tablet by mouth 2 (two) times daily.   No facility-administered encounter medications on file as of 04/25/2018.      Review of Systems  Review of Systems  Constitutional: Positive for fatigue (chronic ). Negative for activity change, chills, fever and unexpected weight change.  HENT: Negative for postnasal drip, rhinorrhea, sinus pressure, sinus pain, sneezing and sore throat.   Respiratory: Positive for shortness of breath (chronic ). Negative for cough and wheezing.   Cardiovascular: Negative for chest pain and palpitations.  Gastrointestinal: Negative for constipation, diarrhea, nausea and vomiting.  Genitourinary: Negative for hematuria and urgency.  Musculoskeletal: Negative for arthralgias.  Skin: Negative for color change.  Neurological: Negative for dizziness, seizures and headaches.  Psychiatric/Behavioral: Negative for dysphoric mood. The patient is not nervous/anxious.   All other systems reviewed and are negative.     Physical Exam  BP (!) 142/88   Pulse (!) 102   Ht _0  (1.753 m)   Wt (!) 387 lb 9.6 oz (175.8 kg)   SpO2 99%   BMI 57.24 kg/m   Wt Readings from Last 5 Encounters:  04/25/18 (!) 387 lb 9.6 oz (175.8 kg)  11/09/17 (!) 390 lb (176.9 kg)  07/01/17 (!) 366 lb 12.8 oz (166.4 kg)  06/01/17 (!) 375 lb (170.1 kg)  05/17/17 (!) 380 lb (172.4 kg)     Physical Exam  Constitutional: He is oriented to person, place, and time and well-developed, well-nourished, and in no distress. Vital signs are normal. No distress.    Morbidly obese  HENT:  Head: Normocephalic and atraumatic.  Right Ear: Hearing, tympanic membrane, external ear and ear canal normal.  Left Ear: Hearing, tympanic membrane, external ear and ear canal normal.  Nose: Nose normal. Right sinus exhibits no maxillary sinus tenderness and no frontal sinus tenderness. Left sinus exhibits no maxillary sinus tenderness and no frontal sinus tenderness.  Mouth/Throat: Uvula is midline, oropharynx is clear and moist and mucous membranes are normal. No oropharyngeal exudate.  mallampati III  Eyes: Pupils are equal, round, and reactive to light.  Neck: Normal range of motion. Neck supple. No JVD present.  Cardiovascular: Normal rate, regular rhythm and normal heart sounds.  Pulmonary/Chest: Effort normal. No accessory muscle usage. No respiratory distress. He has decreased breath sounds (Due to body habitus). He has wheezes. He has  no rhonchi.  Abdominal: Soft. Bowel sounds are normal. There is no tenderness.  Musculoskeletal: Normal range of motion. He exhibits no edema.  Lymphadenopathy:    He has no cervical adenopathy.  Neurological: He is alert and oriented to person, place, and time. Gait normal.  Skin: Skin is warm and dry. He is not diaphoretic. No erythema.  Psychiatric: Mood, memory, affect and judgment normal.  Nursing note and vitals reviewed.    Patient walked in office today.  Patient oxygen saturations remained above 94% off oxygen.  Patient's heart rate was elevated ranging between 100-158 with exertion.  Patient placed back on oxygen and heart rate returns to 98.  Lab Results:  CBC    Component Value Date/Time   WBC 7.2 06/01/2017 1129   RBC 3.90 (L) 06/01/2017 1129   HGB 10.8 (L) 06/01/2017 1129   HCT 34.7 (L) 06/01/2017 1129   PLT 236 06/01/2017 1129   MCV 89.0 06/01/2017 1129   MCH 27.7 06/01/2017 1129   MCHC 31.1 06/01/2017 1129   RDW 15.3 06/01/2017 1129   LYMPHSABS 2.2 05/01/2015 1432   MONOABS 0.6 05/01/2015 1432    EOSABS 0.1 05/01/2015 1432   BASOSABS 0.0 05/01/2015 1432    BMET    Component Value Date/Time   NA 144 06/01/2017 1129   K 4.5 06/01/2017 1129   CL 108 06/01/2017 1129   CO2 27 06/01/2017 1129   GLUCOSE 133 (H) 06/01/2017 1129   BUN 12 06/01/2017 1129   CREATININE 0.80 06/01/2017 1129   CREATININE 0.89 12/15/2016 1602   CALCIUM 8.0 (L) 06/01/2017 1129   GFRNONAA >60 06/01/2017 1129   GFRNONAA >89 12/15/2016 1602   GFRAA >60 06/01/2017 1129   GFRAA >89 12/15/2016 1602    BNP No results found for: BNP  ProBNP No results found for: PROBNP  Imaging: No results found.   Assessment & Plan:    32 year old patient seen office today.  Patient with significant comorbidities of morbid obesity as well as congestive heart failure with reduced ejection fraction.  Discussed extensively with patient need for him to actively work to lose weight as well as diet.  Patient is receptive to this.    Patient is wondering if he can start to wean off oxygen.  Patient to continue BiPAP use as well as oxygen at night.  I am okay with patient trialing coming off of oxygen during the day and using as needed.  Patient will need to monitor oxygen saturations to ensure they are remaining above 90% as well as his heart rate is remaining controlled.  If patient oxygen saturations dropped below 90% he knows that he needs to resume oxygen use.  Patient is he needs to remain on oxygen at night as well as be adherent to BiPAP.  Patient follow-up with Dr. Melvyn Novas in 6 to 8 weeks.  Will work to try to obtain sleep study on patient.    CHF (congestive heart failure) (HCC) Continue Entresto Continue follow-up with cardiology Continue BiPAP use Continue to follow low-sodium diet Discussed with cardiology referral to nutritionist Actively work to diet and exercise  OSA (obstructive sleep apnea) Continue BiPAP . Keep up the hard work using your device.  . Do not drive or operate heavy machinery if tired or  drowsy.  . Please notify the supply company and office if you are unable to use your device regularly due to missing supplies or machine being broken.  . Work on maintaining a healthy weight and following your recommended  nutrition plan  . Maintain proper daily exercise and movement  . Maintaining proper use of your device can also help improve management of other chronic illnesses such as: Blood pressure, blood sugars, and weight management.    BiPAP Cleaning:  Clean weekly, with Dawn soap, and bottle brush.  Set up to air dry.  Will place order for SPO2 monitor from [place], unlikely that they will be covered.  You can purchase her own from local pharmacy.  Need to be checking oxygen saturations to ensure there greater than 90%.  Okay to trial coming off of oxygen during the day if oxygen saturations remain greater than 90%.  Continue to work aggressively on diet and exercise  Speak to cardiology about referral to nutritionist  Follow-up with Dr. Melvyn Novas in 6 to 8 weeks  Extremely obese with respiratory disorder ([place]) Continue to work towards healthy weight      Lauraine Rinne, NP 04/25/2018

## 2018-04-25 NOTE — Assessment & Plan Note (Signed)
Continue to work towards healthy weight 

## 2018-04-25 NOTE — Assessment & Plan Note (Signed)
Continue BiPAP . Keep up the hard work using your device.  . Do not drive or operate heavy machinery if tired or drowsy.  . Please notify the supply company and office if you are unable to use your device regularly due to missing supplies or machine being broken.  . Work on maintaining a healthy weight and following your recommended nutrition plan  . Maintain proper daily exercise and movement  . Maintaining proper use of your device can also help improve management of other chronic illnesses such as: Blood pressure, blood sugars, and weight management.    BiPAP Cleaning:  Clean weekly, with Dawn soap, and bottle brush.  Set up to air dry.  Will place order for SPO2 monitor from DME company, unlikely that they will be covered.  You can purchase her own from local pharmacy.  Need to be checking oxygen saturations to ensure there greater than 90%.  Okay to trial coming off of oxygen during the day if oxygen saturations remain greater than 90%.  Continue to work aggressively on diet and exercise  Speak to cardiology about referral to nutritionist  Follow-up with Dr. Sherene Sires in 6 to 8 weeks

## 2018-04-25 NOTE — Assessment & Plan Note (Signed)
Continue Entresto Continue follow-up with cardiology Continue BiPAP use Continue to follow low-sodium diet Discussed with cardiology referral to nutritionist Actively work to diet and exercise

## 2018-04-26 ENCOUNTER — Telehealth: Payer: Self-pay | Admitting: Pulmonary Disease

## 2018-04-26 NOTE — Telephone Encounter (Signed)
04/26/18  1713  Followed up with patient.  Patient reports he has not been following up with Dr. Burnard Leigh office.  Patient unsure of Dr. Colette Ribas is even still at that location.  Patient would like for our office to manage pulmonary as well as his BiPAP management.  We will move patient from Dr. Sherene Sires to Dr. Wynona Neat.  Patient agrees.  [facility] will now take over management of BiPAP as well as pulmonary conditions.  Elisha Headland, FNP

## 2018-05-13 ENCOUNTER — Ambulatory Visit: Payer: Medicaid Other | Admitting: Cardiovascular Disease

## 2018-05-13 ENCOUNTER — Encounter: Payer: Self-pay | Admitting: Cardiovascular Disease

## 2018-05-19 ENCOUNTER — Other Ambulatory Visit: Payer: Self-pay | Admitting: Cardiovascular Disease

## 2018-06-15 ENCOUNTER — Ambulatory Visit: Payer: Medicaid Other | Admitting: Pulmonary Disease

## 2018-06-15 ENCOUNTER — Ambulatory Visit: Payer: Medicaid Other | Admitting: Internal Medicine

## 2018-06-23 ENCOUNTER — Encounter: Payer: Self-pay | Admitting: Pulmonary Disease

## 2018-06-23 ENCOUNTER — Ambulatory Visit (INDEPENDENT_AMBULATORY_CARE_PROVIDER_SITE_OTHER): Payer: Medicaid Other | Admitting: Pulmonary Disease

## 2018-06-23 ENCOUNTER — Telehealth: Payer: Self-pay | Admitting: Pulmonary Disease

## 2018-06-23 VITALS — BP 142/92 | HR 91 | Ht 69.0 in | Wt 370.8 lb

## 2018-06-23 DIAGNOSIS — I272 Pulmonary hypertension, unspecified: Secondary | ICD-10-CM

## 2018-06-23 DIAGNOSIS — G4733 Obstructive sleep apnea (adult) (pediatric): Secondary | ICD-10-CM | POA: Diagnosis not present

## 2018-06-23 NOTE — Progress Notes (Signed)
Ross Schmidt    [id number]    1985/12/05  Primary Care Physician:Hall, Edwinna Areola, MD  Referring Physician: Celene Squibb, MD [id number] [place], [place] [address]  Chief complaint:   Patient being seen for follow-up visit History of pulmonary hypertension, obstructive sleep apnea, morbid obesity   HPI:  Reformed smoker Shortness of breath with activity has improved  Morbid obesity, congestive apnea, systolic heart failure  He has obstructive sleep apnea for which he uses BiPAP on a regular basis, very compliant with use  Patient states he has had a pulse oximeter for a few months and is oxygen level is consistently above 90  He is walking more, has lost about 20 pounds recently  Is compliant with BiPAP use he is on BiPAP of 25/20 with oxygen supplementation His sleep studies none on file States his machine works very well  Smoking history: Reformed smoker   Outpatient Encounter Medications as of 06/23/2018  Medication Sig  . atorvastatin (LIPITOR) 40 MG tablet TAKE ONE TABLET BY MOUTH EVERY DAY  . blood glucose meter kit and supplies KIT Dispense based on patient and insurance preference. Use up to four times daily as directed. (FOR ICD-9 250.00, 250.01).  . carvedilol (COREG) 25 MG tablet TAKE ONE TABLET BY MOUTH TWICE DAILY WITH FOOD  . ENTRESTO 49-51 MG TAKE 1 TABLET BY MOUTH TWICE DAILY  . furosemide (LASIX) 40 MG tablet Take 1 tablet (40 mg total) by mouth as needed.  . hydrALAZINE (APRESOLINE) 100 MG tablet Take 1 tablet (100 mg total) by mouth 3 (three) times daily.  . metFORMIN (GLUCOPHAGE) 1000 MG tablet Take 1 tablet (1,000 mg total) by mouth 2 (two) times daily.  . sacubitril-valsartan (ENTRESTO) 49-51 MG Take 1 tablet by mouth 2 (two) times daily.   No facility-administered encounter medications on file as of 06/23/2018.     Allergies as of 06/23/2018 - Review Complete 06/23/2018  Allergen Reaction Noted  . Morphine and related  09/14/2016    . Nicoderm [nicotine]  09/14/2016    Past Medical History:  Diagnosis Date  . Asthma   . CHF (congestive heart failure) ([place])   . COPD (chronic obstructive pulmonary disease) ([place])   . Diabetes mellitus without complication ([place])   . HLD (hyperlipidemia) 08/11/2016  . Hypertension   . Obesity   . Oxygen deficiency   . Premature baby   . Sleep apnea     Past Surgical History:  Procedure Laterality Date  . LEFT HEART CATH AND CORONARY ANGIOGRAPHY N/A 06/01/2017   Procedure: LEFT HEART CATH AND CORONARY ANGIOGRAPHY;  Surgeon: Jettie Booze, MD;  Location: [facility];  Service: Cardiovascular;  Laterality: N/A;  . TONSILLECTOMY      Family History  Problem Relation Age of Onset  . Diabetes Maternal Uncle   . Heart disease Maternal Uncle   . Diabetes Maternal Grandmother   . Hypertension Maternal Grandmother   . Miscarriages / Stillbirths Maternal Grandfather     Social History   Socioeconomic History  . Marital status: Single    Spouse name: Not on file  . Number of children: 0  . Years of education: 10  . Highest education level: Not on file  Occupational History    Comment: disability  Social Needs  . Financial resource strain: Not on file  . Food insecurity:    Worry: Not on file    Inability: Not on file  .  Transportation needs:    Medical: Not on file    Non-medical: Not on file  Tobacco Use  . Smoking status: Former Smoker    Packs/day: 0.25    Types: Cigarettes    Start date: 10/12/1997    Last attempt to quit: 02/10/2016    Years since quitting: 2.3  . Smokeless tobacco: Never Used  Substance and Sexual Activity  . Alcohol use: No  . Drug use: No  . Sexual activity: Not Currently  Lifestyle  . Physical activity:    Days per week: Not on file    Minutes per session: Not on file  . Stress: Not on file  Relationships  . Social connections:    Talks on phone: Not on file    Gets together: Not on file    Attends religious service: Not  on file    Active member of club or organization: Not on file    Attends meetings of clubs or organizations: Not on file    Relationship status: Not on file  . Intimate partner violence:    Fear of current or ex partner: Not on file    Emotionally abused: Not on file    Physically abused: Not on file    Forced sexual activity: Not on file  Other Topics Concern  . Not on file  Social History Narrative   Lives with mother and step father   disabled    Review of Systems  Constitutional: Negative.   HENT: Negative.   Eyes: Negative.   Respiratory: Positive for apnea.   Cardiovascular: Negative.   Gastrointestinal: Negative.   Endocrine: Negative.   Genitourinary: Negative.   Musculoskeletal: Negative.   Psychiatric/Behavioral: Positive for sleep disturbance.    Vitals:   06/23/18 1158  BP: (!) 142/92  Pulse: 91  SpO2: 99%     Physical Exam  Constitutional: He is oriented to person, place, and time. He appears well-developed and well-nourished.  obese  HENT:  Head: Normocephalic.  Eyes: Pupils are equal, round, and reactive to light. Conjunctivae and EOM are normal. Right eye exhibits no discharge. Left eye exhibits no discharge.  Neck: Normal range of motion. Neck supple. No thyromegaly present.  Cardiovascular: Normal rate and regular rhythm.  Pulmonary/Chest: Effort normal and breath sounds normal. No respiratory distress. He has no wheezes.  Abdominal: Soft. Bowel sounds are normal. He exhibits no distension. There is no tenderness.  Musculoskeletal: Normal range of motion. He exhibits no edema or deformity.  Neurological: He is alert and oriented to person, place, and time. He has normal reflexes. No cranial nerve deficit.  Skin: Skin is warm and dry. No erythema.  Psychiatric: He has a normal mood and affect.    Data Reviewed: Records reviewed  Assessment:   Obstructive sleep apnea-continue BiPAP use  Pulmonary hypertension  Systolic congestive heart  failure  He feels things are improving for him Saturations consistently over 90% on his pulse ox--we can discontinue oxygen supplementation during the day  I did discuss the reasons why he still needs oxygen supplementation piped into his BiPAP at night    Plan/Recommendations:  We can DC oxygen supplementation during the day  Continue oxygen supplementation into BiPAP at night  We will try and obtain a download from his BiPAP   He continues to feel well Encouraged to continue exercising on a regular basis  Commended about his weight loss  Discontinue oxygen supplementation during the day but continue oxygen piped in to BiPAP   Elisandra Deshmukh  MD [facility] 06/23/2018, 12:18 PM  CC: Celene Squibb, MD

## 2018-06-23 NOTE — Patient Instructions (Addendum)
Patient with pulmonary hypertension, congestive heart failure, obstructive sleep apnea  Feeling well, no significant concerns today  Oxygen levels have consistently been above 90 off oxygen supplementation during the day  We will discontinue oxygen supplementation during the day  Continue use of oxygen through the BiPAP at night--this should be continued  Continue weight loss efforts  I will see you back in the office in 3 months

## 2018-06-23 NOTE — Telephone Encounter (Signed)
Obtain Bipap compliance from [employer]

## 2018-06-28 ENCOUNTER — Encounter: Payer: Self-pay | Admitting: Pulmonary Disease

## 2018-06-29 ENCOUNTER — Telehealth: Payer: Self-pay | Admitting: Pulmonary Disease

## 2018-06-29 NOTE — Telephone Encounter (Signed)
Compliance download is in look at folder. Will route to AO

## 2018-06-29 NOTE — Telephone Encounter (Signed)
Compliance data reviewed 05/30/2018-06/28/2018   Good compliance with CPAP

## 2018-07-05 ENCOUNTER — Other Ambulatory Visit: Payer: Self-pay | Admitting: Cardiovascular Disease

## 2018-07-22 ENCOUNTER — Ambulatory Visit (INDEPENDENT_AMBULATORY_CARE_PROVIDER_SITE_OTHER): Payer: Medicaid Other | Admitting: Cardiovascular Disease

## 2018-07-22 ENCOUNTER — Encounter: Payer: Self-pay | Admitting: Cardiovascular Disease

## 2018-07-22 ENCOUNTER — Ambulatory Visit: Payer: Medicaid Other | Admitting: Cardiovascular Disease

## 2018-07-22 ENCOUNTER — Telehealth: Payer: Self-pay | Admitting: Cardiovascular Disease

## 2018-07-22 VITALS — BP 160/80 | HR 90 | Ht 69.0 in | Wt 370.0 lb

## 2018-07-22 DIAGNOSIS — I429 Cardiomyopathy, unspecified: Secondary | ICD-10-CM

## 2018-07-22 DIAGNOSIS — I10 Essential (primary) hypertension: Secondary | ICD-10-CM

## 2018-07-22 DIAGNOSIS — N183 Chronic kidney disease, stage 3 unspecified: Secondary | ICD-10-CM

## 2018-07-22 DIAGNOSIS — I5022 Chronic systolic (congestive) heart failure: Secondary | ICD-10-CM

## 2018-07-22 DIAGNOSIS — I2510 Atherosclerotic heart disease of native coronary artery without angina pectoris: Secondary | ICD-10-CM

## 2018-07-22 DIAGNOSIS — J9611 Chronic respiratory failure with hypoxia: Secondary | ICD-10-CM

## 2018-07-22 DIAGNOSIS — Z9981 Dependence on supplemental oxygen: Secondary | ICD-10-CM

## 2018-07-22 DIAGNOSIS — G4733 Obstructive sleep apnea (adult) (pediatric): Secondary | ICD-10-CM

## 2018-07-22 NOTE — Patient Instructions (Addendum)

## 2018-07-22 NOTE — Telephone Encounter (Signed)
Pre-cert Verification for the following procedure   2 D Echo with contrast dx: cardiomyopathy scheduled for 07-28-2018 at [facility]

## 2018-07-22 NOTE — Progress Notes (Signed)
SUBJECTIVE: The patient presents for follow-up of chronic systolic heart failure and malignant hypertension.  Chronic exertional dyspnea is stable.  He denies chest pain, palpitations, and leg swelling.  He is feeling very well overall.  He has not had use Lasix recently.  He checks his blood pressure at home with systolic readings generally in the 135-140 range.  He was treated for gout last week with colchicine and received a steroid injection and was told his blood pressure might go up.  He had been on oxygen for years up until about a month and a half ago.  He has been walking more regularly since the weather got cooler.  O2 saturations are 95% on room air.  Nuclear stress test showed a small area of inferoapical ischemia, calculated LVEF 42%. There was an elevated tid of 1.37 which may represent balanced ischemia and increased cardiac risk. Overall, it was deemed a high risk study.  Coronary angiography in 2018 demonstrated mild nonobstructive disease.   Review of Systems: As per "subjective", otherwise negative.  Allergies  Allergen Reactions  . Morphine And Related     Heart beat fast   . Nicoderm [Nicotine]     Rash from the patch     Current Outpatient Medications  Medication Sig Dispense Refill  . atorvastatin (LIPITOR) 40 MG tablet TAKE ONE TABLET BY MOUTH EVERY DAY 90 tablet 1  . blood glucose meter kit and supplies KIT Dispense based on patient and insurance preference. Use up to four times daily as directed. (FOR ICD-9 250.00, 250.01). 1 each 0  . carvedilol (COREG) 25 MG tablet TAKE ONE TABLET BY MOUTH TWICE DAILY WITH FOOD 180 tablet 1  . furosemide (LASIX) 40 MG tablet Take 1 tablet (40 mg total) by mouth as needed.    . hydrALAZINE (APRESOLINE) 100 MG tablet TAKE 1 TABLET BY MOUTH THREE TIMES DAILY 270 tablet 3  . metFORMIN (GLUCOPHAGE) 1000 MG tablet Take 1 tablet (1,000 mg total) by mouth 2 (two) times daily. 180 tablet 1  . sacubitril-valsartan (ENTRESTO)  49-51 MG Take 1 tablet by mouth 2 (two) times daily. 28 tablet 0   No current facility-administered medications for this visit.     Past Medical History:  Diagnosis Date  . Asthma   . CHF (congestive heart failure) ([place])   . COPD (chronic obstructive pulmonary disease) ([place])   . Diabetes mellitus without complication ([place])   . HLD (hyperlipidemia) 08/11/2016  . Hypertension   . Obesity   . Oxygen deficiency   . Premature baby   . Sleep apnea     Past Surgical History:  Procedure Laterality Date  . LEFT HEART CATH AND CORONARY ANGIOGRAPHY N/A 06/01/2017   Procedure: LEFT HEART CATH AND CORONARY ANGIOGRAPHY;  Surgeon: Jettie Booze, MD;  Location: [facility];  Service: Cardiovascular;  Laterality: N/A;  . TONSILLECTOMY      Social History   Socioeconomic History  . Marital status: Single    Spouse name: Not on file  . Number of children: 0  . Years of education: 10  . Highest education level: Not on file  Occupational History    Comment: disability  Social Needs  . Financial resource strain: Not on file  . Food insecurity:    Worry: Not on file    Inability: Not on file  . Transportation needs:    Medical: Not on file    Non-medical: Not on file  Tobacco Use  . Smoking  status: Former Smoker    Packs/day: 0.25    Types: Cigarettes    Start date: 10/12/1997    Last attempt to quit: 02/10/2016    Years since quitting: 2.4  . Smokeless tobacco: Never Used  Substance and Sexual Activity  . Alcohol use: No  . Drug use: No  . Sexual activity: Not Currently  Lifestyle  . Physical activity:    Days per week: Not on file    Minutes per session: Not on file  . Stress: Not on file  Relationships  . Social connections:    Talks on phone: Not on file    Gets together: Not on file    Attends religious service: Not on file    Active member of club or organization: Not on file    Attends meetings of clubs or organizations: Not on file    Relationship status:  Not on file  . Intimate partner violence:    Fear of current or ex partner: Not on file    Emotionally abused: Not on file    Physically abused: Not on file    Forced sexual activity: Not on file  Other Topics Concern  . Not on file  Social History Narrative   Lives with mother and step father   disabled     Vitals:   07/22/18 1231  BP: (!) 160/80  Pulse: 90  SpO2: 95%  Weight: (!) 370 lb (167.8 kg)  Height: '5\' 9"'  (1.753 m)    Wt Readings from Last 3 Encounters:  07/22/18 (!) 370 lb (167.8 kg)  06/23/18 (!) 370 lb 12.8 oz (168.2 kg)  04/25/18 (!) 387 lb 9.6 oz (175.8 kg)     PHYSICAL EXAM General: NAD HEENT: Normal. Neck: Unable to assess JVP due to body habitus Lungs: Diminished sounds throughout, no crackles or wheezes CV: Regular rate and rhythm, normal S1/S2, no S3/S4, no murmur.  Brawny trace bilateral lower extremity edema Abdomen: Morbidly obese.  Neurologic: Alert and oriented.  Psych: Normal affect. Skin: Normal. Musculoskeletal: No gross deformities.    ECG: Reviewed above under Subjective   Labs: Lab Results  Component Value Date/Time   K 4.5 06/01/2017 11:29 AM   BUN 12 06/01/2017 11:29 AM   CREATININE 0.80 06/01/2017 11:29 AM   CREATININE 0.89 12/15/2016 04:02 PM   ALT 13 12/15/2016 04:02 PM   TSH 1.86 12/15/2016 04:02 PM   HGB 10.8 (L) 06/01/2017 11:29 AM     Lipids: Lab Results  Component Value Date/Time   LDLCALC 87 12/15/2016 04:02 PM   CHOL 134 12/15/2016 04:02 PM   TRIG 115 12/15/2016 04:02 PM   HDL 24 (L) 12/15/2016 04:02 PM       ASSESSMENT AND PLAN:  1. Malignant hypertension: Blood pressure is elevated in our office but is reasonably controlled at home.  I again emphasized the importance of physical activity and weight loss.  He has begun walking. He takes Coreg 25 mg twice dailyandEntresto 49/51 mg twice along with hydralazine 100 mg 3 times daily.   2. Chronic kidney disease: I will obtain a copy of most recent labs  from PCP.  3. Sleep apnea: On BiPAP.  4. Chronic systolic heart failure, LVEF 30-35%:This is a nonischemic cardiomyopathy and likely hypertensive in etiology.  Symptomatically stable.  Continue CoregandEntresto 49/51 mg bid.I encouraged weight loss. I told him to weigh himself daily and should he develop bilateral leg edema or increasing shortness of breath with a weight gain of 3 pounds in 24  hours, to take Lasix.  He has not had to use Lasix recently.  Blood pressures are reasonably controlled at home. I will obtain an echocardiogram and reassess left ventricular systolic function.  If LVEF remains severely reduced, I would consider an EP referral for AICD consideration.  QRS duration 105 ms today; thus, not a candidate for cardiac resynchronization therapy.  6. Chronic hypoxic respiratory failure: He is now off of oxygen.  He uses CPAP for OSA.   Disposition: Follow up 6 months   Kate Sable, M.D., F.A.C.C.

## 2018-07-28 ENCOUNTER — Ambulatory Visit (HOSPITAL_COMMUNITY): Admission: RE | Admit: 2018-07-28 | Payer: Medicaid Other | Source: Ambulatory Visit

## 2018-09-06 ENCOUNTER — Other Ambulatory Visit: Payer: Self-pay | Admitting: Cardiovascular Disease

## 2018-09-23 ENCOUNTER — Ambulatory Visit: Payer: Medicaid Other | Admitting: Pulmonary Disease

## 2018-10-10 ENCOUNTER — Ambulatory Visit: Payer: Medicaid Other | Admitting: Pulmonary Disease

## 2018-10-17 ENCOUNTER — Ambulatory Visit: Payer: Medicaid Other | Admitting: Pulmonary Disease

## 2018-10-23 ENCOUNTER — Other Ambulatory Visit: Payer: Self-pay | Admitting: Cardiovascular Disease

## 2019-01-10 ENCOUNTER — Other Ambulatory Visit: Payer: Self-pay | Admitting: Cardiovascular Disease

## 2019-02-16 ENCOUNTER — Ambulatory Visit: Payer: Medicaid Other | Admitting: Cardiovascular Disease

## 2019-05-19 ENCOUNTER — Other Ambulatory Visit: Payer: Self-pay | Admitting: Cardiovascular Disease

## 2019-07-10 ENCOUNTER — Ambulatory Visit: Payer: Medicaid Other | Admitting: Cardiovascular Disease

## 2019-07-11 ENCOUNTER — Telehealth: Payer: Self-pay | Admitting: Cardiovascular Disease

## 2019-07-11 ENCOUNTER — Encounter: Payer: Self-pay | Admitting: Cardiovascular Disease

## 2019-07-11 NOTE — Telephone Encounter (Signed)
FYI.  °Contacted patient regarding recall appointment, patient notified our office they did not wish to keep this appointment at this time.  Deleted recall from system. °

## 2019-08-21 ENCOUNTER — Other Ambulatory Visit: Payer: Self-pay | Admitting: Cardiovascular Disease

## 2019-09-04 ENCOUNTER — Ambulatory Visit: Payer: Medicaid Other | Admitting: Cardiovascular Disease

## 2019-09-05 ENCOUNTER — Encounter: Payer: Self-pay | Admitting: Cardiovascular Disease

## 2019-09-18 ENCOUNTER — Telehealth: Payer: Self-pay | Admitting: *Deleted

## 2019-09-18 NOTE — Telephone Encounter (Signed)
Patient verbally consented for telehealth visits with [facility] and understands that his insurance company will be billed for the encounter.   Unable to do vitals signs

## 2019-10-03 ENCOUNTER — Telehealth (INDEPENDENT_AMBULATORY_CARE_PROVIDER_SITE_OTHER): Payer: Medicaid Other | Admitting: Cardiovascular Disease

## 2019-10-03 ENCOUNTER — Encounter: Payer: Self-pay | Admitting: Cardiovascular Disease

## 2019-10-03 VITALS — Ht 69.0 in | Wt 355.0 lb

## 2019-10-03 DIAGNOSIS — J9611 Chronic respiratory failure with hypoxia: Secondary | ICD-10-CM

## 2019-10-03 DIAGNOSIS — I10 Essential (primary) hypertension: Secondary | ICD-10-CM

## 2019-10-03 DIAGNOSIS — I5022 Chronic systolic (congestive) heart failure: Secondary | ICD-10-CM

## 2019-10-03 DIAGNOSIS — I429 Cardiomyopathy, unspecified: Secondary | ICD-10-CM

## 2019-10-03 DIAGNOSIS — G4733 Obstructive sleep apnea (adult) (pediatric): Secondary | ICD-10-CM

## 2019-10-03 DIAGNOSIS — N183 Chronic kidney disease, stage 3 unspecified: Secondary | ICD-10-CM

## 2019-10-03 NOTE — Progress Notes (Signed)
Virtual Visit via Telephone Note   This visit type was conducted due to national recommendations for restrictions regarding the COVID-19 Pandemic (e.g. social distancing) in an effort to limit this patient's exposure and mitigate transmission in our community.  Due to his co-morbid illnesses, this patient is at least at moderate risk for complications without adequate follow up.  This format is felt to be most appropriate for this patient at this time.  The patient did not have access to video technology/had technical difficulties with video requiring transitioning to audio format only (telephone).  All issues noted in this document were discussed and addressed.  No physical exam could be performed with this format.  Please refer to the patient's chart for his  consent to telehealth for [facility].   Date:  10/03/2019   ID:  Ross Schmidt, DOB [birth year], MRN [id number]  Patient Location: Home Provider Location: Office  PCP:  Celene Squibb, MD  Cardiologist:  Kate Sable, MD  Electrophysiologist:  None   Evaluation Performed:  Follow-Up Visit  Chief Complaint:  CHF  History of Present Illness:    Ross Schmidt is a 34 y.o. male with chronic systolic heart failure/nonischemic cardiomyopathy and malignant hypertension.  He is doing well.  Chronic exertional dyspnea is stable.  He has had some mild leg swelling but has not had to use Lasix recently.  His blood pressure cuff is broken and he is having a new one sent to him.   Past Medical History:  Diagnosis Date  . Asthma   . CHF (congestive heart failure) ([place])   . COPD (chronic obstructive pulmonary disease) ([place])   . Diabetes mellitus without complication ([place])   . HLD (hyperlipidemia) 08/11/2016  . Hypertension   . Obesity   . Oxygen deficiency   . Premature baby   . Sleep apnea    Past Surgical History:  Procedure Laterality Date  . LEFT HEART CATH AND CORONARY ANGIOGRAPHY N/A 06/01/2017   Procedure: LEFT  HEART CATH AND CORONARY ANGIOGRAPHY;  Surgeon: Jettie Booze, MD;  Location: [facility];  Service: Cardiovascular;  Laterality: N/A;  . TONSILLECTOMY       Current Meds  Medication Sig  . atorvastatin (LIPITOR) 40 MG tablet TAKE 1 TABLET BY MOUTH EVERY DAY  . blood glucose meter kit and supplies KIT Dispense based on patient and insurance preference. Use up to four times daily as directed. (FOR ICD-9 250.00, 250.01).  . carvedilol (COREG) 25 MG tablet TAKE ONE TABLET BY MOUTH TWICE DAILY WITH FOOD  . ENTRESTO 49-51 MG TAKE 1 TABLET BY MOUTH TWICE DAILY  . furosemide (LASIX) 40 MG tablet Take 1 tablet (40 mg total) by mouth as needed.  . hydrALAZINE (APRESOLINE) 100 MG tablet TAKE 1 TABLET BY MOUTH THREE TIMES DAILY  . metFORMIN (GLUCOPHAGE) 1000 MG tablet Take 1 tablet (1,000 mg total) by mouth 2 (two) times daily.  . sacubitril-valsartan (ENTRESTO) 49-51 MG Take 1 tablet by mouth 2 (two) times daily.     Allergies:   Morphine and related and Nicoderm [nicotine]   Social History   Tobacco Use  . Smoking status: Former Smoker    Packs/day: 0.25    Types: Cigarettes    Start date: 10/12/1997    Quit date: 02/10/2016    Years since quitting: 3.6  . Smokeless tobacco: Never Used  Substance Use Topics  . Alcohol use: No  . Drug use: No     Family Hx: The patient's  family history includes Diabetes in his maternal grandmother and maternal uncle; Heart disease in his maternal uncle; Hypertension in his maternal grandmother; Miscarriages / [place] in his maternal grandfather.  ROS:   Please see the history of present illness.     All other systems reviewed and are negative.   Prior CV studies:   The following studies were reviewed today:  Nuclear stress test in July 2018 showed a small area of inferoapical ischemia, calculated LVEF 42%. There was an elevatedtidof 1.37 which may represent balanced ischemia and increased cardiac risk. Overall, it was deemed a high  risk study.  Coronary angiography in August 2018 demonstrated mild nonobstructive disease.  Labs/Other Tests and Data Reviewed:    EKG:  No ECG reviewed.  Recent Labs: No results found for requested labs within last 8760 hours.   Recent Lipid Panel Lab Results  Component Value Date/Time   CHOL 134 12/15/2016 04:02 PM   TRIG 115 12/15/2016 04:02 PM   HDL 24 (L) 12/15/2016 04:02 PM   CHOLHDL 5.6 (H) 12/15/2016 04:02 PM   LDLCALC 87 12/15/2016 04:02 PM    Wt Readings from Last 3 Encounters:  10/03/19 (!) 355 lb (161 kg)  07/22/18 (!) 370 lb (167.8 kg)  06/23/18 (!) 370 lb 12.8 oz (168.2 kg)     Objective:    Vital Signs:  Ht '5\' 9"'  (1.753 m)   Wt (!) 355 lb (161 kg)   BMI 52.42 kg/m    VITAL SIGNS:  reviewed  ASSESSMENT & PLAN:    1. Malignant hypertension:His blood pressure cuff is broken and he has having a new one sent to him.He takes Coreg 25 mg twice dailyandEntresto 49/51 mg twicealong withhydralazine 100 mg 3 times daily.   2. Chronic kidney disease: I will obtain a copy of most recent labs from PCP.  3. Sleep apnea: On BiPAP.  4. Chronic systolic heart failure, LVEF 30-35%:This is a nonischemic cardiomyopathy and likely hypertensive in etiology.  Symptomatically stable.Continue CoregandEntresto 49/51 mg bid. I previously encouraged weight loss.  I previously told him to weigh himself daily and should he develop bilateral leg edema or increasing shortness of breath with a weight gain of 3 pounds in 24 hours, to take Lasix.  He has not had to use Lasix recently.   I previously ordered an echocardiogram in 2019 but this was not pursued.  I will obtain an echocardiogram and reassess left ventricular systolic function. If LVEF remains severely reduced, I would consider an EP referral for AICD consideration. QRS duration 105 ms today;thus,not a candidate for cardiac resynchronization therapy.  5. Chronic hypoxic respiratory failure: He uses CPAP for  OSA.    COVID-19 Education: The signs and symptoms of COVID-19 were discussed with the patient and how to seek care for testing (follow up with PCP or arrange E-visit).  The importance of social distancing was discussed today.  Time:   Today, I have spent 15 minutes with the patient with telehealth technology discussing the above problems.     Medication Adjustments/Labs and Tests Ordered: Current medicines are reviewed at length with the patient today.  Concerns regarding medicines are outlined above.   Tests Ordered: No orders of the defined types were placed in this encounter.   Medication Changes: No orders of the defined types were placed in this encounter.   Follow Up:  Virtual Visit  in 1 year(s)  Signed, Kate Sable, MD  10/03/2019 11:05 AM    [place]

## 2019-10-03 NOTE — Patient Instructions (Signed)
Medication Instructions:  Your physician recommends that you continue on your current medications as directed. Please refer to the Current Medication list given to you today.  *If you need a refill on your cardiac medications before your next appointment, please call your pharmacy*  Lab Work: None today If you have labs (blood work) drawn today and your tests are completely normal, you will receive your results only by: Marland Kitchen MyChart Message (if you have MyChart) OR . A paper copy in the mail If you have any lab test that is abnormal or we need to change your treatment, we will call you to review the results.  Testing/Procedures: Your physician has requested that you have an echocardiogram. Echocardiography is a painless test that uses sound waves to create images of your heart. It provides your doctor with information about the size and shape of your heart and how well your heart's chambers and valves are working. This procedure takes approximately one hour. There are no restrictions for this procedure.    Follow-Up: At [facility], you and your health needs are our priority.  As part of our continuing mission to provide you with exceptional heart care, we have created designated Provider Care Teams.  These Care Teams include your primary Cardiologist (physician) and Advanced Practice Providers (APPs -  Physician Assistants and Nurse Practitioners) who all work together to provide you with the care you need, when you need it.  Your next appointment:   12 month(s)  The format for your next appointment:   Virtual Visit   Provider:   Kate Sable, MD  Other Instructions None     Thank you for choosing [facility] !

## 2019-10-03 NOTE — Addendum Note (Signed)
Addended by: Barbarann Ehlers A on: 10/03/2019 11:25 AM   Modules accepted: Orders

## 2019-10-04 ENCOUNTER — Telehealth: Payer: Self-pay | Admitting: Licensed Clinical Social Worker

## 2019-10-04 NOTE — Telephone Encounter (Signed)
CSW referred to assist patient with obtaining a BP cuff. CSW contacted patient to inform cuff will be delivered to home. Patient grateful for support and assistance. CSW available as needed. Jackie Sherrill Buikema, LCSW, CCSW-MCS [phone]  

## 2019-10-19 ENCOUNTER — Other Ambulatory Visit: Payer: Medicaid Other

## 2019-10-19 ENCOUNTER — Other Ambulatory Visit: Payer: Self-pay | Admitting: Cardiovascular Disease

## 2019-11-12 ENCOUNTER — Other Ambulatory Visit: Payer: Self-pay | Admitting: Cardiovascular Disease

## 2019-11-23 ENCOUNTER — Other Ambulatory Visit: Payer: Self-pay

## 2019-11-23 ENCOUNTER — Ambulatory Visit (INDEPENDENT_AMBULATORY_CARE_PROVIDER_SITE_OTHER): Payer: Medicaid Other

## 2019-11-23 DIAGNOSIS — I5022 Chronic systolic (congestive) heart failure: Secondary | ICD-10-CM | POA: Diagnosis not present

## 2019-11-23 DIAGNOSIS — I429 Cardiomyopathy, unspecified: Secondary | ICD-10-CM

## 2019-11-28 ENCOUNTER — Telehealth: Payer: Self-pay | Admitting: *Deleted

## 2019-11-28 NOTE — Telephone Encounter (Signed)
-----   Message from Laqueta Linden, MD sent at 11/23/2019  5:33 PM EST ----- LV function has normalized

## 2019-11-28 NOTE — Telephone Encounter (Signed)
Ross Schmidt, [place]  01/13/7095 4:38 PM EST    Patient notified. Copy to pmd.

## 2019-12-15 ENCOUNTER — Other Ambulatory Visit: Payer: Self-pay | Admitting: Cardiovascular Disease

## 2020-01-17 ENCOUNTER — Other Ambulatory Visit: Payer: Self-pay | Admitting: Cardiovascular Disease

## 2020-03-13 ENCOUNTER — Telehealth: Payer: Self-pay | Admitting: Cardiovascular Disease

## 2020-03-13 ENCOUNTER — Encounter: Payer: Self-pay | Admitting: *Deleted

## 2020-03-13 MED ORDER — HYDRALAZINE HCL 100 MG PO TABS: 100.0000 mg | ORAL_TABLET | Freq: Three times a day (TID) | ORAL | 1 refills | Status: DC

## 2020-03-13 NOTE — Telephone Encounter (Signed)
Medicaid not approving hydrALAZINE (APRESOLINE) 100 MG tablet  For the patient under Dr Purvis Sheffield name

## 2020-03-13 NOTE — Telephone Encounter (Signed)
Last lab work requested from PCP Refill sent

## 2020-10-08 NOTE — Progress Notes (Deleted)
Cardiology Office Note  Date: 10/08/2020   ID: Ross Schmidt, DOB 1986-06-21, MRN [id number]  PCP:  Celene Squibb, MD  Cardiologist:  Kate Sable, MD (Inactive) Electrophysiologist:  None   Chief Complaint: Follow-up chronic systolic heart failure  History of Present Illness: Ross Schmidt is a 34 y.o. male with a history of history of chronic systolic heart failure, COPD, diabetes, HLD, HTN, pulmonary hypertension, ischemic cardiomyopathy, malignant hypertension, obesity, sleep apnea  Last encounter with Dr. Bronson Ing via telemedicine 10/03/2019.  Chronic exertional dyspnea was stable.  Had some mild leg swelling but had not needed to use Lasix.  He was taking Coreg 25 mg p.o. twice daily and Entresto 49/51 mg p.o. twice daily with hydralazine 100 mg 3 times daily.  He was compliant with BiPAP for sleep apnea.  Last echocardiogram showed LVEF of 30 to 35%.  He was symptomatically stable.  He was instructed to weigh himself daily and if developing bilateral leg edema or increased shortness of breath with weight gain of 3 pounds in 24 hours to take Lasix.  At that point he had not had to usel Lasix recently.  A follow-up echocardiogram was ordered.  If LVEF remained severely reduced would consider EP referral for AICD consideration.  Past Medical History:  Diagnosis Date  . Asthma   . CHF (congestive heart failure) ([place])   . COPD (chronic obstructive pulmonary disease) ([place])   . Diabetes mellitus without complication ([place])   . HLD (hyperlipidemia) 08/11/2016  . Hypertension   . Obesity   . Oxygen deficiency   . Premature baby   . Sleep apnea     Past Surgical History:  Procedure Laterality Date  . LEFT HEART CATH AND CORONARY ANGIOGRAPHY N/A 06/01/2017   Procedure: LEFT HEART CATH AND CORONARY ANGIOGRAPHY;  Surgeon: Jettie Booze, MD;  Location: [facility];  Service: Cardiovascular;  Laterality: N/A;  . TONSILLECTOMY      Current Outpatient Medications   Medication Sig Dispense Refill  . atorvastatin (LIPITOR) 40 MG tablet TAKE 1 TABLET BY MOUTH DAILY 90 tablet 1  . blood glucose meter kit and supplies KIT Dispense based on patient and insurance preference. Use up to four times daily as directed. (FOR ICD-9 250.00, 250.01). 1 each 0  . carvedilol (COREG) 25 MG tablet TAKE ONE TABLET BY MOUTH TWICE DAILY WITH FOOD 180 tablet 1  . ENTRESTO 49-51 MG TAKE 1 TABLET BY MOUTH TWICE DAILY 60 tablet 3  . furosemide (LASIX) 40 MG tablet Take 1 tablet (40 mg total) by mouth as needed.    . hydrALAZINE (APRESOLINE) 100 MG tablet Take 1 tablet (100 mg total) by mouth 3 (three) times daily. 270 tablet 1  . metFORMIN (GLUCOPHAGE) 1000 MG tablet Take 1 tablet (1,000 mg total) by mouth 2 (two) times daily. 180 tablet 1  . sacubitril-valsartan (ENTRESTO) 49-51 MG Take 1 tablet by mouth 2 (two) times daily. 28 tablet 0   No current facility-administered medications for this visit.   Allergies:  Morphine and related and Nicoderm [nicotine]   Social History: The patient  reports that he quit smoking about 4 years ago. His smoking use included cigarettes. He started smoking about 23 years ago. He smoked 0.25 packs per day. He has never used smokeless tobacco. He reports that he does not drink alcohol and does not use drugs.   Family History: The patient's family history includes Diabetes in his maternal grandmother and maternal uncle; Heart disease in his  maternal uncle; Hypertension in his maternal grandmother; Miscarriages / [place] in his maternal grandfather.   ROS:  Please see the history of present illness. Otherwise, complete review of systems is positive for {NONE DEFAULTED:18576::"none"}.  All other systems are reviewed and negative.   Physical Exam: VS:  There were no vitals taken for this visit., BMI There is no height or weight on file to calculate BMI.  Wt Readings from Last 3 Encounters:  10/03/19 (!) 355 lb (161 kg)  07/22/18 (!) 370 lb  (167.8 kg)  06/23/18 (!) 370 lb 12.8 oz (168.2 kg)    General: Patient appears comfortable at rest. HEENT: Conjunctiva and lids normal, oropharynx clear with moist mucosa. Neck: Supple, no elevated JVP or carotid bruits, no thyromegaly. Lungs: Clear to auscultation, nonlabored breathing at rest. Cardiac: Regular rate and rhythm, no S3 or significant systolic murmur, no pericardial rub. Abdomen: Soft, nontender, no hepatomegaly, bowel sounds present, no guarding or rebound. Extremities: No pitting edema, distal pulses 2+. Skin: Warm and dry. Musculoskeletal: No kyphosis. Neuropsychiatric: Alert and oriented x3, affect grossly appropriate.  ECG:  {EKG/Telemetry Strips Reviewed:[phone]}  Recent Labwork: No results found for requested labs within last 8760 hours.     Component Value Date/Time   CHOL 134 12/15/2016 1602   TRIG 115 12/15/2016 1602   HDL 24 (L) 12/15/2016 1602   CHOLHDL 5.6 (H) 12/15/2016 1602   VLDL 23 12/15/2016 1602   LDLCALC 87 12/15/2016 1602    Other Studies Reviewed Today:  Echocardiogram 11/23/2019 1. Left ventricular ejection fraction, by estimation, is 60 to 65%. The left ventricle has normal function. The left ventrical is not well visualized. There is moderately increased concentric left ventricular hypertrophy. Indeterminate diastolic filling due to E-A fusion. 2. Right ventricular systolic function is normal. The right ventricular size is not well visualized. There is normal pulmonary artery systolic pressure. 3. The mitral valve is grossly normal. no evidence of mitral valve regurgitation. 4. The aortic valve was not well visualized. Aortic valve regurgitation is not visualized. Comparison(s): Echocardiogram done 09/07/16 showed an EF of 35%. Cardiac catheterization doen 06/01/17 showed an EF between 35-45%.    06/01/2017 LEFT HEART CATH AND CORONARY ANGIOGRAPHY    Mid LAD lesion, 25 %stenosed.  There is moderate left ventricular systolic  dysfunction.  The left ventricular ejection fraction is 35-45% by visual estimate.  LV end diastolic pressure is moderately elevated. LVEDP 29 mm Hg.  There is no mitral valve regurgitation.  There is no aortic valve stenosis.   Non-obstructive CAD.  He needs aggressive risk factor modification including weight loss.    Nonischemic cardiomyopathy.  Diagnostic Dominance: Left     Assessment and Plan:  1. Chronic systolic heart failure (HCC)   2. Cardiomyopathy, unspecified type ([place])   3. Essential hypertension   4. Morbid obesity ([place])   5. OSA (obstructive sleep apnea)   6. Chronic respiratory failure with hypoxia, on home oxygen therapy (HCC)   7. Stage 3 chronic kidney disease, unspecified whether stage 3a or 3b CKD (HCC)      Medication Adjustments/Labs and Tests Ordered: Current medicines are reviewed at length with the patient today.  Concerns regarding medicines are outlined above.   Disposition: Follow-up with ***  Signed, Levell July, NP 10/08/2020 7:35 PM    [facility] at [facility] [place], [place], [place] [address] Phone: [phone]; Fax: [phone]

## 2020-10-09 ENCOUNTER — Ambulatory Visit: Payer: Medicaid Other | Admitting: Family Medicine

## 2020-10-09 NOTE — Progress Notes (Signed)
Cardiology Office Note  Date: 10/10/2020   ID: Ross Schmidt, DOB [birth year], MRN [id number]  PCP:  Neale Burly, MD  Cardiologist:  Kate Sable, MD (Inactive) Electrophysiologist:  None   Chief Complaint: Follow-up chronic systolic heart failure  History of Present Illness: Ross Schmidt is a 34 y.o. male with a history of history of chronic systolic heart failure, COPD, diabetes, HLD, HTN, pulmonary hypertension, ischemic cardiomyopathy, malignant hypertension, obesity, sleep apnea  Last encounter with Dr. Bronson Ing via telemedicine 10/03/2019.  Chronic exertional dyspnea was stable.  Had some mild leg swelling but had not needed to use Lasix.  He was taking Coreg 25 mg p.o. twice daily and Entresto 49/51 mg p.o. twice daily with hydralazine 100 mg 3 times daily.  He was compliant with BiPAP for sleep apnea.  Last echocardiogram showed LVEF of 30 to 35%.  He was symptomatically stable.  He was instructed to weigh himself daily and if developing bilateral leg edema or increased shortness of breath with weight gain of 3 pounds in 24 hours to take Lasix.  At that point he had not had to use Lasix recently.  A follow-up echocardiogram was ordered.  If LVEF remained severely reduced would consider EP referral for AICD consideration.   Follow-up echocardiogram on 11/23/2019 showed significant improvement of EF to 60 to 65%.  Moderately increased concentric LVH.  Indeterminate diastolic filling due to EA fusion.  He is here for 1 year follow-up.  He denies any recent acute illnesses or hospitalizations.  His blood pressure is significantly high on arrival today at 176/120.  He has not taken any of his antihypertensive medications nor his [name] yet because he states he usually does not take his medication until after he eats.  He denies any anginal or exertional symptoms, palpitations or arrhythmias.  Denies any PND, orthopnea, orthostatic symptoms, CVA or TIA like symptoms.  Denies  any bleeding issues, claudication-like symptoms, DVT or PE-like symptoms.  Has some occasional lower extremity edema for which he takes Lasix.  States he recently saw his primary care provider and stated his hemoglobin A1c was elevated but otherwise lab work was okay per his statement.  History of morbid obesity.  States he has lost some weight recently.  He has lost around 10 pounds since December of last year.  He has significant sleep apnea and uses BiPAP at home and states she is compliant with therapy.   Past Medical History:  Diagnosis Date  . Asthma   . CHF (congestive heart failure) ([place])   . COPD (chronic obstructive pulmonary disease) ([place])   . Diabetes mellitus without complication ([place])   . HLD (hyperlipidemia) 08/11/2016  . Hypertension   . Obesity   . Oxygen deficiency   . Premature baby   . Sleep apnea     Past Surgical History:  Procedure Laterality Date  . LEFT HEART CATH AND CORONARY ANGIOGRAPHY N/A 06/01/2017   Procedure: LEFT HEART CATH AND CORONARY ANGIOGRAPHY;  Surgeon: Jettie Booze, MD;  Location: [facility];  Service: Cardiovascular;  Laterality: N/A;  . TONSILLECTOMY      Current Outpatient Medications  Medication Sig Dispense Refill  . atorvastatin (LIPITOR) 40 MG tablet TAKE 1 TABLET BY MOUTH DAILY 90 tablet 1  . blood glucose meter kit and supplies KIT Dispense based on patient and insurance preference. Use up to four times daily as directed. (FOR ICD-9 250.00, 250.01). 1 each 0  . carvedilol (COREG) 25 MG tablet TAKE  ONE TABLET BY MOUTH TWICE DAILY WITH FOOD 180 tablet 1  . furosemide (LASIX) 40 MG tablet Take 1 tablet (40 mg total) by mouth as needed.    . hydrALAZINE (APRESOLINE) 100 MG tablet Take 1 tablet (100 mg total) by mouth 3 (three) times daily. 270 tablet 1  . metFORMIN (GLUCOPHAGE) 1000 MG tablet Take 1 tablet (1,000 mg total) by mouth 2 (two) times daily. 180 tablet 1  . sacubitril-valsartan (ENTRESTO) 49-51 MG Take 1 tablet by  mouth 2 (two) times daily. 28 tablet 0   No current facility-administered medications for this visit.   Allergies:  Morphine and related and Nicoderm [nicotine]   Social History: The patient  reports that he quit smoking about 4 years ago. His smoking use included cigarettes. He started smoking about 23 years ago. He smoked 0.25 packs per day. He has never used smokeless tobacco. He reports that he does not drink alcohol and does not use drugs.   Family History: The patient's family history includes Diabetes in his maternal grandmother and maternal uncle; Heart disease in his maternal uncle; Hypertension in his maternal grandmother; Miscarriages / [place] in his maternal grandfather.   ROS:  Please see the history of present illness. Otherwise, complete review of systems is positive for none.  All other systems are reviewed and negative.   Physical Exam: VS:  BP (!) 176/120   Pulse 97   Resp 18   Ht _0  (1.753 m)   Wt (!) 345 lb 9.6 oz (156.8 kg)   SpO2 96%   BMI 51.04 kg/m , BMI Body mass index is 51.04 kg/m.  Wt Readings from Last 3 Encounters:  10/10/20 (!) 345 lb 9.6 oz (156.8 kg)  10/03/19 (!) 355 lb (161 kg)  07/22/18 (!) 370 lb (167.8 kg)    General: Severely morbidly patient appears comfortable at rest. Neck: Supple, no elevated JVP or carotid bruits, no thyromegaly. Lungs: Clear to auscultation, nonlabored breathing at rest. Cardiac: Regular rate and rhythm, no S3 or significant systolic murmur, no pericardial rub. Extremities: No pitting edema, distal pulses 2+. Skin: Warm and dry. Musculoskeletal: No kyphosis. Neuropsychiatric: Alert and oriented x3, affect grossly appropriate.  ECG:  An ECG dated 10/10/2020 was personally reviewed today and demonstrated:  Normal sinus rhythm rate of 97, possible left atrial enlargement, rightward axis, T wave abnormality consider inferior ischemia  Recent Labwork: No results found for requested labs within last 8760 hours.      Component Value Date/Time   CHOL 134 12/15/2016 1602   TRIG 115 12/15/2016 1602   HDL 24 (L) 12/15/2016 1602   CHOLHDL 5.6 (H) 12/15/2016 1602   VLDL 23 12/15/2016 1602   LDLCALC 87 12/15/2016 1602    Other Studies Reviewed Today:  Echocardiogram 11/23/2019 1. Left ventricular ejection fraction, by estimation, is 60 to 65%. The left ventricle has normal function. The left ventrical is not well visualized. There is moderately increased concentric left ventricular hypertrophy. Indeterminate diastolic filling due to E-A fusion. 2. Right ventricular systolic function is normal. The right ventricular size is not well visualized. There is normal pulmonary artery systolic pressure. 3. The mitral valve is grossly normal. no evidence of mitral valve regurgitation. 4. The aortic valve was not well visualized. Aortic valve regurgitation is not visualized. Comparison(s): Echocardiogram done 09/07/16 showed an EF of 35%. Cardiac catheterization doen 06/01/17 showed an EF between 35-45%.    06/01/2017 LEFT HEART CATH AND CORONARY ANGIOGRAPHY    Mid LAD lesion, 25 %stenosed.  There is moderate left ventricular systolic dysfunction.  The left ventricular ejection fraction is 35-45% by visual estimate.  LV end diastolic pressure is moderately elevated. LVEDP 29 mm Hg.  There is no mitral valve regurgitation.  There is no aortic valve stenosis.   Non-obstructive CAD.  He needs aggressive risk factor modification including weight loss.    Nonischemic cardiomyopathy.  Diagnostic Dominance: Left     Assessment and Plan:  1. Chronic systolic heart failure ([place])   2. Essential hypertension   3. Mixed hyperlipidemia   4. OSA on CPAP    1. Chronic systolic heart failure ([facility]) Recent significant improvement and EF from prior.Echocardiogram November 23, 2019: EF 60 to 65%.  Moderate increase can suspect LVH.  Indeterminate diastolic filling due to E-a fusion.  EF improved from  previous echo 2018 demonstrating EF of 35 to 45%.  Continue Entresto 49/51 mg p.o. twice daily.  2. Essential hypertension Blood pressure significantly elevated today.  However he states he has not taken his antihypertensive medication nor his Entresto today.  He states he usually does not take his antihypertensive medication until after he eats.  States he has not eaten.  Please add amlodipine 5 mg to current regimen.  Continue carvedilol 25 mg p.o. twice daily.  Continue hydralazine 100 mg 3 times daily.  Please refer him to hypertension clinic to see Dr. Oval Linsey.  3. Mixed hyperlipidemia Continue atorvastatin 40 mg p.o. daily.   4. OSA on BiPAP States he is compliant with BiPAP therapy for sleep apnea.   5.  Morbid obesity. Spoke with patient regarding lifestyle changes needed for weight loss, nutritional and dietary changes.  Patient states he has lost approximately 10 pounds over the last year which is an accomplishment for him that he is proud of.  Encouraged further lifestyle modification and weight loss.  Medication Adjustments/Labs and Tests Ordered: Current medicines are reviewed at length with the patient today.  Concerns regarding medicines are outlined above.   Disposition: Follow-up with Dr. Harl Bowie or APP 3 months.  Signed, Levell July, NP 10/10/2020 2:48 PM    [facility] at [place], [place], [place] [address] Phone: [phone]; Fax: [phone]

## 2020-10-10 ENCOUNTER — Encounter: Payer: Self-pay | Admitting: Family Medicine

## 2020-10-10 ENCOUNTER — Other Ambulatory Visit: Payer: Self-pay

## 2020-10-10 ENCOUNTER — Ambulatory Visit (INDEPENDENT_AMBULATORY_CARE_PROVIDER_SITE_OTHER): Payer: Medicaid Other | Admitting: Family Medicine

## 2020-10-10 VITALS — BP 176/120 | HR 97 | Resp 18 | Ht 69.0 in | Wt 345.6 lb

## 2020-10-10 DIAGNOSIS — Z9989 Dependence on other enabling machines and devices: Secondary | ICD-10-CM

## 2020-10-10 DIAGNOSIS — I5022 Chronic systolic (congestive) heart failure: Secondary | ICD-10-CM | POA: Diagnosis not present

## 2020-10-10 DIAGNOSIS — G4733 Obstructive sleep apnea (adult) (pediatric): Secondary | ICD-10-CM

## 2020-10-10 DIAGNOSIS — I10 Essential (primary) hypertension: Secondary | ICD-10-CM | POA: Diagnosis not present

## 2020-10-10 DIAGNOSIS — E782 Mixed hyperlipidemia: Secondary | ICD-10-CM

## 2020-10-10 MED ORDER — AMLODIPINE BESYLATE 5 MG PO TABS: 5.0000 mg | ORAL_TABLET | Freq: Every day | ORAL | 3 refills | Status: DC

## 2020-10-10 NOTE — Patient Instructions (Addendum)
Medication Instructions:  Your physician has recommended you make the following change in your medication:  1.  START Amlodipine 5 mg taking 1 daily   *If you need a refill on your cardiac medications before your next appointment, please call your pharmacy*   Lab Work: None ordered  If you have labs (blood work) drawn today and your tests are completely normal, you will receive your results only by: Marland Kitchen MyChart Message (if you have MyChart) OR . A paper copy in the mail If you have any lab test that is abnormal or we need to change your treatment, we will call you to review the results.   Testing/Procedures: None ordered   You have been referred to Hypertension Clinic, to see Dr. Duke Salvia for your elevated blood pressure.     Follow-Up: At [facility], you and your health needs are our priority.  As part of our continuing mission to provide you with exceptional heart care, we have created designated Provider Care Teams.  These Care Teams include your primary Cardiologist (physician) and Advanced Practice Providers (APPs -  Physician Assistants and Nurse Practitioners) who all work together to provide you with the care you need, when you need it.  We recommend signing up for the patient portal called "MyChart".  Sign up information is provided on this After Visit Summary.  MyChart is used to connect with patients for Virtual Visits (Telemedicine).  Patients are able to view lab/test results, encounter notes, upcoming appointments, etc.  Non-urgent messages can be sent to your provider as well.   To learn more about what you can do with MyChart, go to [email or web address].    Your next appointment:   3 month(s)  The format for your next appointment:   In Person  Provider:   Nena Polio, NP      Other Instructions

## 2020-11-06 ENCOUNTER — Ambulatory Visit: Payer: Medicaid Other | Admitting: Cardiovascular Disease

## 2020-12-30 ENCOUNTER — Ambulatory Visit: Payer: Medicaid Other | Admitting: Cardiovascular Disease

## 2020-12-30 NOTE — Progress Notes (Deleted)
Advanced Hypertension Clinic Initial Assessment:    Date:  12/30/2020   ID:  Ross Schmidt, DOB 01-26-1986, MRN [id number]  PCP:  Toma Deiters, MD  Cardiologist:  Prentice Docker, MD (Inactive)  Nephrologist:  Referring MD: Netta Neat., NP   CC: Hypertension  History of Present Illness:    Ross Schmidt is a 35 y.o. male with a hx of chronic systolic diastolic heart failure, COPD, hypertension, hyperlipidemia, pulmonary hypertension, diabetes, OSA, and morbid obesity here to establish care in the hypertension clinic.  He was previously a patient of Dr. Purvis Sheffield and last saw him 09/2019.  At that time he had stable exertional dyspnea.  He had a repeat echo 11/2019 that revealed improvement in his LVEF to 60 to 65% with moderate LVH.  He followed up with Nena Polio, NP, 09/2020.  At that time his blood pressure was 176/120 off his antihypertensives.  Previous antihypertensives:  Secondary Causes of Hypertension  Medications/Herbal: OCP, steroids, stimulants, antidepressants, weight loss medication, immune suppressants, NSAIDs, sympathomimetics, alcohol, caffeine, licorice, ginseng, St. John's wort, chemo  Sleep Apnea Renal artery stenosis Hyperaldosteronism Hyper/hypothyroidism Pheochromocytoma: palpitations, tachycardia, headache, diaphoresis (plasma metanephrines) Cushing's syndrome: Cushingoid facies, central obesity, proximal muscle weakness, and ecchymoses, adrenal incidentaloma (cortisol) Coarctation of the aorta  .[id number]  Past Medical History:  Diagnosis Date  . Asthma   . CHF (congestive heart failure) ([facility])   . COPD (chronic obstructive pulmonary disease) ([facility])   . Diabetes mellitus without complication ([facility])   . HLD (hyperlipidemia) 08/11/2016  . Hypertension   . Obesity   . Oxygen deficiency   . Premature baby   . Sleep apnea     Past Surgical History:  Procedure Laterality Date  . LEFT HEART CATH AND CORONARY ANGIOGRAPHY N/A 06/01/2017    Procedure: LEFT HEART CATH AND CORONARY ANGIOGRAPHY;  Surgeon: Corky Crafts, MD;  Location: [facility];  Service: Cardiovascular;  Laterality: N/A;  . TONSILLECTOMY      Current Medications: No outpatient medications have been marked as taking for the 12/30/20 encounter (Appointment) with Chilton Si, MD.     Allergies:   Morphine and related and Nicoderm [nicotine]   Social History   Socioeconomic History  . Marital status: Single    Spouse name: Not on file  . Number of children: 0  . Years of education: 10  . Highest education level: Not on file  Occupational History    Comment: disability  Tobacco Use  . Smoking status: Former Smoker    Packs/day: 0.25    Types: Cigarettes    Start date: 10/12/1997    Quit date: 02/10/2016    Years since quitting: 4.8  . Smokeless tobacco: Never Used  Vaping Use  . Vaping Use: Never used  Substance and Sexual Activity  . Alcohol use: No  . Drug use: No  . Sexual activity: Not Currently  Other Topics Concern  . Not on file  Social History Narrative   Lives with mother and step father   disabled   Social Determinants of Health   Financial Resource Strain: Not on file  Food Insecurity: Not on file  Transportation Needs: Not on file  Physical Activity: Not on file  Stress: Not on file  Social Connections: Not on file     Family History: The patient's ***family history includes Diabetes in his maternal grandmother and maternal uncle; Heart disease in his maternal uncle; Hypertension in his maternal grandmother; Miscarriages / [place] in his maternal  grandfather.  ROS:   Please see the history of present illness.    *** All other systems reviewed and are negative.  EKGs/Labs/Other Studies Reviewed:    EKG:  EKG is *** ordered today.  The ekg ordered today demonstrates ***  Echocardiogram 11/23/2019 1. Left ventricular ejection fraction, by estimation, is 60 to 65%. The left ventricle has  normal function. The left ventrical is not well visualized. There is moderately increased concentric left ventricular hypertrophy. Indeterminate diastolic filling due to E-A fusion. 2. Right ventricular systolic function is normal. The right ventricular size is not well visualized. There is normal pulmonary artery systolic pressure. 3. The mitral valve is grossly normal. no evidence of mitral valve regurgitation. 4. The aortic valve was not well visualized. Aortic valve regurgitation is not visualized. Comparison(s): Echocardiogram done 09/07/16 showed an EF of 35%. Cardiac catheterization doen 06/01/17 showed an EF between 35-45%.    06/01/2017 LEFT HEART CATH AND CORONARY ANGIOGRAPHY    Mid LAD lesion, 25 %stenosed.  There is moderate left ventricular systolic dysfunction.  The left ventricular ejection fraction is 35-45% by visual estimate.  LV end diastolic pressure is moderately elevated. LVEDP 29 mm Hg.  There is no mitral valve regurgitation.  There is no aortic valve stenosis.  Non-obstructive CAD. He needs aggressive risk factor modification including weight loss.   Nonischemic cardiomyopathy.    Recent Labs: No results found for requested labs within last 8760 hours.   Recent Lipid Panel    Component Value Date/Time   CHOL 134 12/15/2016 1602   TRIG 115 12/15/2016 1602   HDL 24 (L) 12/15/2016 1602   CHOLHDL 5.6 (H) 12/15/2016 1602   VLDL 23 12/15/2016 1602   LDLCALC 87 12/15/2016 1602    Physical Exam:   VS:  There were no vitals taken for this visit. , BMI There is no height or weight on file to calculate BMI. GENERAL:  Well appearing HEENT: Pupils equal round and reactive, fundi not visualized, oral mucosa unremarkable NECK:  No jugular venous distention, waveform within normal limits, carotid upstroke brisk and symmetric, no bruits, no thyromegaly LYMPHATICS:  No cervical adenopathy LUNGS:  Clear to auscultation bilaterally HEART:  RRR.  PMI  not displaced or sustained,S1 and S2 within normal limits, no S3, no S4, no clicks, no rubs, *** murmurs ABD:  Flat, positive bowel sounds normal in frequency in pitch, no bruits, no rebound, no guarding, no midline pulsatile mass, no hepatomegaly, no splenomegaly EXT:  2 plus pulses throughout, no edema, no cyanosis no clubbing SKIN:  No rashes no nodules NEURO:  Cranial nerves II through XII grossly intact, motor grossly intact throughout PSYCH:  Cognitively intact, oriented to person place and time   ASSESSMENT:    No diagnosis found.  PLAN:    1. ***  he consents to be monitored in our remote patient monitoring program through [employer].  he will track his blood pressure twice daily and understands that these trends will help [place] to adjust his medications as needed prior to his next appointment.  he *** interested in enrolling in the PREP exercise and nutrition program through the [facility].     Disposition:    FU with MD/PharmD in {gen number [id number]} {Days to years:10300} {virtual or in-office}   Medication Adjustments/Labs and Tests Ordered: Current medicines are reviewed at length with the patient today.  Concerns regarding medicines are outlined above.  No orders of the defined types were placed in this encounter.  No orders of the  defined types were placed in this encounter.    Signed, Chilton Si, MD  12/30/2020 1:26 PM    [facility]

## 2021-01-05 NOTE — Progress Notes (Deleted)
Cardiology Office Note  Date: 01/05/2021   ID: Ross Schmidt, DOB [birth year], MRN [id number]  PCP:  Neale Burly, MD  Cardiologist:  No primary care provider on file. Electrophysiologist:  None   Chief Complaint: Follow-up chronic systolic heart failure  History of Present Illness: Ross Schmidt is a 35 y.o. male with a history of history of chronic systolic heart failure, COPD, diabetes, HLD, HTN, pulmonary hypertension, ischemic cardiomyopathy, malignant hypertension, obesity, sleep apnea  Last encounter with Dr. Bronson Ing via telemedicine 10/03/2019.  Chronic exertional dyspnea was stable.  Had some mild leg swelling but had not needed to use Lasix.  He was taking Coreg 25 mg p.o. twice daily and Entresto 49/51 mg p.o. twice daily with hydralazine 100 mg 3 times daily.  He was compliant with BiPAP for sleep apnea.  Last echocardiogram showed LVEF of 30 to 35%.  He was symptomatically stable.  He was instructed to weigh himself daily and if developing bilateral leg edema or increased shortness of breath with weight gain of 3 pounds in 24 hours to take Lasix.  At that point he had not had to use Lasix recently.  A follow-up echocardiogram was ordered.  If LVEF remained severely reduced would consider EP referral for AICD consideration.   Follow-up echocardiogram on 11/23/2019 showed significant improvement of EF to 60 to 65%.  Moderately increased concentric LVH.  Indeterminate diastolic filling due to EA fusion.  He is here for 1 year follow-up.  He denies any recent acute illnesses or hospitalizations.  His blood pressure is significantly high on arrival today at 176/120.  He has not taken any of his antihypertensive medications nor his [name] yet because he states he usually does not take his medication until after he eats.  He denies any anginal or exertional symptoms, palpitations or arrhythmias.  Denies any PND, orthopnea, orthostatic symptoms, CVA or TIA like symptoms.  Denies  any bleeding issues, claudication-like symptoms, DVT or PE-like symptoms.  Has some occasional lower extremity edema for which he takes Lasix.  States he recently saw his primary care provider and stated his hemoglobin A1c was elevated but otherwise lab work was okay per his statement.  History of morbid obesity.  States he has lost some weight recently.  He has lost around 10 pounds since December of last year.  He has significant sleep apnea and uses BiPAP at home and states she is compliant with therapy.  Cancelled HTN clinic appt.  Past Medical History:  Diagnosis Date  . Asthma   . CHF (congestive heart failure) ([place])   . COPD (chronic obstructive pulmonary disease) ([place])   . Diabetes mellitus without complication ([place])   . HLD (hyperlipidemia) 08/11/2016  . Hypertension   . Obesity   . Oxygen deficiency   . Premature baby   . Sleep apnea     Past Surgical History:  Procedure Laterality Date  . LEFT HEART CATH AND CORONARY ANGIOGRAPHY N/A 06/01/2017   Procedure: LEFT HEART CATH AND CORONARY ANGIOGRAPHY;  Surgeon: Jettie Booze, MD;  Location: [facility];  Service: Cardiovascular;  Laterality: N/A;  . TONSILLECTOMY      Current Outpatient Medications  Medication Sig Dispense Refill  . amLODipine (NORVASC) 5 MG tablet Take 1 tablet (5 mg total) by mouth daily. 90 tablet 3  . atorvastatin (LIPITOR) 40 MG tablet TAKE 1 TABLET BY MOUTH DAILY 90 tablet 1  . blood glucose meter kit and supplies KIT Dispense based on patient and  insurance preference. Use up to four times daily as directed. (FOR ICD-9 250.00, 250.01). 1 each 0  . carvedilol (COREG) 25 MG tablet TAKE ONE TABLET BY MOUTH TWICE DAILY WITH FOOD 180 tablet 1  . furosemide (LASIX) 40 MG tablet Take 1 tablet (40 mg total) by mouth as needed.    . hydrALAZINE (APRESOLINE) 100 MG tablet Take 1 tablet (100 mg total) by mouth 3 (three) times daily. 270 tablet 1  . metFORMIN (GLUCOPHAGE) 1000 MG tablet Take 1 tablet  (1,000 mg total) by mouth 2 (two) times daily. 180 tablet 1  . sacubitril-valsartan (ENTRESTO) 49-51 MG Take 1 tablet by mouth 2 (two) times daily. 28 tablet 0   No current facility-administered medications for this visit.   Allergies:  Morphine and related and Nicoderm [nicotine]   Social History: The patient  reports that he quit smoking about 4 years ago. His smoking use included cigarettes. He started smoking about 23 years ago. He smoked 0.25 packs per day. He has never used smokeless tobacco. He reports that he does not drink alcohol and does not use drugs.   Family History: The patient's family history includes Diabetes in his maternal grandmother and maternal uncle; Heart disease in his maternal uncle; Hypertension in his maternal grandmother; Miscarriages / [place] in his maternal grandfather.   ROS:  Please see the history of present illness. Otherwise, complete review of systems is positive for none.  All other systems are reviewed and negative.   Physical Exam: VS:  There were no vitals taken for this visit., BMI There is no height or weight on file to calculate BMI.  Wt Readings from Last 3 Encounters:  10/10/20 (!) 345 lb 9.6 oz (156.8 kg)  10/03/19 (!) 355 lb (161 kg)  07/22/18 (!) 370 lb (167.8 kg)    General: Severely morbidly patient appears comfortable at rest. Neck: Supple, no elevated JVP or carotid bruits, no thyromegaly. Lungs: Clear to auscultation, nonlabored breathing at rest. Cardiac: Regular rate and rhythm, no S3 or significant systolic murmur, no pericardial rub. Extremities: No pitting edema, distal pulses 2+. Skin: Warm and dry. Musculoskeletal: No kyphosis. Neuropsychiatric: Alert and oriented x3, affect grossly appropriate.  ECG:  An ECG dated 10/10/2020 was personally reviewed today and demonstrated:  Normal sinus rhythm rate of 97, possible left atrial enlargement, rightward axis, T wave abnormality consider inferior ischemia  Recent  Labwork: No results found for requested labs within last 8760 hours.     Component Value Date/Time   CHOL 134 12/15/2016 1602   TRIG 115 12/15/2016 1602   HDL 24 (L) 12/15/2016 1602   CHOLHDL 5.6 (H) 12/15/2016 1602   VLDL 23 12/15/2016 1602   LDLCALC 87 12/15/2016 1602    Other Studies Reviewed Today:  Echocardiogram 11/23/2019 1. Left ventricular ejection fraction, by estimation, is 60 to 65%. The left ventricle has normal function. The left ventrical is not well visualized. There is moderately increased concentric left ventricular hypertrophy. Indeterminate diastolic filling due to E-A fusion. 2. Right ventricular systolic function is normal. The right ventricular size is not well visualized. There is normal pulmonary artery systolic pressure. 3. The mitral valve is grossly normal. no evidence of mitral valve regurgitation. 4. The aortic valve was not well visualized. Aortic valve regurgitation is not visualized. Comparison(s): Echocardiogram done 09/07/16 showed an EF of 35%. Cardiac catheterization doen 06/01/17 showed an EF between 35-45%.    06/01/2017 LEFT HEART CATH AND CORONARY ANGIOGRAPHY    Mid LAD lesion, 25 %stenosed.  There is moderate left ventricular systolic dysfunction.  The left ventricular ejection fraction is 35-45% by visual estimate.  LV end diastolic pressure is moderately elevated. LVEDP 29 mm Hg.  There is no mitral valve regurgitation.  There is no aortic valve stenosis.   Non-obstructive CAD.  He needs aggressive risk factor modification including weight loss.    Nonischemic cardiomyopathy.  Diagnostic Dominance: Left     Assessment and Plan:  No diagnosis found. 1. Chronic systolic heart failure ([place]) Recent significant improvement and EF from prior.Echocardiogram November 23, 2019: EF 60 to 65%.  Moderate increase can suspect LVH.  Indeterminate diastolic filling due to E-a fusion.  EF improved from previous echo 2018  demonstrating EF of 35 to 45%.  Continue Entresto 49/51 mg p.o. twice daily.  2. Essential hypertension Blood pressure significantly elevated today.  However he states he has not taken his antihypertensive medication nor his Entresto today.  He states he usually does not take his antihypertensive medication until after he eats.  States he has not eaten.  Please add amlodipine 5 mg to current regimen.  Continue carvedilol 25 mg p.o. twice daily.  Continue hydralazine 100 mg 3 times daily.  Please refer him to hypertension clinic to see Dr. Oval Linsey.  3. Mixed hyperlipidemia Continue atorvastatin 40 mg p.o. daily.   4. OSA on BiPAP States he is compliant with BiPAP therapy for sleep apnea.   5.  Morbid obesity. Spoke with patient regarding lifestyle changes needed for weight loss, nutritional and dietary changes.  Patient states he has lost approximately 10 pounds over the last year which is an accomplishment for him that he is proud of.  Encouraged further lifestyle modification and weight loss.  Medication Adjustments/Labs and Tests Ordered: Current medicines are reviewed at length with the patient today.  Concerns regarding medicines are outlined above.   Disposition: Follow-up with Dr. Harl Bowie or APP 3 months.  Signed, Levell July, NP 01/05/2021 11:33 PM    [facility] at [place], [place], [place] [address] Phone: [phone]; Fax: [phone]

## 2021-01-06 ENCOUNTER — Ambulatory Visit: Payer: Medicaid Other | Admitting: Family Medicine

## 2021-01-06 ENCOUNTER — Encounter: Payer: Self-pay | Admitting: Family Medicine

## 2021-01-07 ENCOUNTER — Encounter: Payer: Self-pay | Admitting: Cardiovascular Disease

## 2021-05-15 ENCOUNTER — Other Ambulatory Visit: Payer: Self-pay

## 2021-05-15 ENCOUNTER — Encounter: Payer: Self-pay | Admitting: Pulmonary Disease

## 2021-05-15 ENCOUNTER — Ambulatory Visit (INDEPENDENT_AMBULATORY_CARE_PROVIDER_SITE_OTHER): Payer: Medicaid Other | Admitting: Pulmonary Disease

## 2021-05-15 VITALS — BP 144/90 | HR 94 | Temp 97.8°F | Ht 69.0 in | Wt 347.4 lb

## 2021-05-15 DIAGNOSIS — G4733 Obstructive sleep apnea (adult) (pediatric): Secondary | ICD-10-CM

## 2021-05-15 NOTE — Patient Instructions (Signed)
I will see you back in the office in about 3 months  We will schedule you for an in lab split-night study for obstructive sleep apnea, pulmonary hypertension  Continue using BiPAP on a nightly basis, continue using oxygen supplementation  Call with significant concerns  Continue weight loss efforts

## 2021-05-15 NOTE — Progress Notes (Signed)
Ross Schmidt    [id number]    1986/03/23  Primary Care Physician:Hasanaj, Samul Dada, MD  Referring Physician: Neale Burly, MD [address] [place],  [place] [address]  Chief complaint:   Patient being seen for follow-up visit History of pulmonary hypertension, obstructive sleep apnea, morbid obesity   HPI:  Continues to use BiPAP on a regular basis Has no significant concerns today  He feels his machine may be becoming dysfunctional, not working as well as previously Last study was over 5 years ago Continues to use oxygen supplementation piped into system  Reformed smoker  Morbid obesity, history of systolic heart failure, history of pulmonary hypertension  Continues to function well during the day  His weight is stable Tries to remain active  Is compliant with BiPAP use he is on BiPAP of 25/20 with oxygen supplementation His sleep studies none on file States his machine works very well  Smoking history: Reformed smoker   Outpatient Encounter Medications as of 05/15/2021  Medication Sig   atorvastatin (LIPITOR) 40 MG tablet TAKE 1 TABLET BY MOUTH DAILY   blood glucose meter kit and supplies KIT Dispense based on patient and insurance preference. Use up to four times daily as directed. (FOR ICD-9 250.00, 250.01).   carvedilol (COREG) 25 MG tablet TAKE ONE TABLET BY MOUTH TWICE DAILY WITH FOOD   furosemide (LASIX) 40 MG tablet Take 1 tablet (40 mg total) by mouth as needed.   hydrALAZINE (APRESOLINE) 100 MG tablet Take 1 tablet (100 mg total) by mouth 3 (three) times daily.   metFORMIN (GLUCOPHAGE) 1000 MG tablet Take 1 tablet (1,000 mg total) by mouth 2 (two) times daily.   sacubitril-valsartan (ENTRESTO) 49-51 MG Take 1 tablet by mouth 2 (two) times daily.   amLODipine (NORVASC) 5 MG tablet Take 1 tablet (5 mg total) by mouth daily.   No facility-administered encounter medications on file as of 05/15/2021.    Allergies as of 05/15/2021 - Review  Complete 05/15/2021  Allergen Reaction Noted   Morphine and related  09/14/2016   Nicoderm [nicotine]  09/14/2016    Past Medical History:  Diagnosis Date   Asthma    CHF (congestive heart failure) ([facility])    COPD (chronic obstructive pulmonary disease) ([facility])    Diabetes mellitus without complication ([place])    HLD (hyperlipidemia) 08/11/2016   Hypertension    Obesity    Oxygen deficiency    Premature baby    Sleep apnea     Past Surgical History:  Procedure Laterality Date   LEFT HEART CATH AND CORONARY ANGIOGRAPHY N/A 06/01/2017   Procedure: LEFT HEART CATH AND CORONARY ANGIOGRAPHY;  Surgeon: Jettie Booze, MD;  Location: [facility];  Service: Cardiovascular;  Laterality: N/A;   TONSILLECTOMY      Family History  Problem Relation Age of Onset   Diabetes Maternal Uncle    Heart disease Maternal Uncle    Diabetes Maternal Grandmother    Hypertension Maternal Grandmother    Miscarriages / Stillbirths Maternal Grandfather     Social History   Socioeconomic History   Marital status: Single    Spouse name: Not on file   Number of children: 0   Years of education: 10   Highest education level: Not on file  Occupational History    Comment: disability  Tobacco Use   Smoking status: Former    Packs/day: 0.25    Types: Cigarettes    Start date:  10/12/1997    Quit date: 02/10/2016    Years since quitting: 5.2   Smokeless tobacco: Never  Vaping Use   Vaping Use: Never used  Substance and Sexual Activity   Alcohol use: No   Drug use: No   Sexual activity: Not Currently  Other Topics Concern   Not on file  Social History Narrative   Lives with mother and step father   disabled   Social Determinants of Health   Financial Resource Strain: Not on file  Food Insecurity: Not on file  Transportation Needs: Not on file  Physical Activity: Not on file  Stress: Not on file  Social Connections: Not on file  Intimate Partner Violence: Not on file    Review  of Systems  Constitutional: Negative.   HENT: Negative.    Eyes: Negative.   Respiratory:  Positive for apnea.   Cardiovascular: Negative.   Gastrointestinal: Negative.   Endocrine: Negative.   Genitourinary: Negative.   Musculoskeletal: Negative.   Psychiatric/Behavioral:  Positive for sleep disturbance.    Vitals:   05/15/21 1145  BP: (!) 144/90  Pulse: 94  Temp: 97.8 F (36.6 C)  SpO2: 97%     Physical Exam Constitutional:      Appearance: He is well-developed.     Comments: obese  Eyes:     General:        Right eye: No discharge.        Left eye: No discharge.     Conjunctiva/sclera: Conjunctivae normal.     Pupils: Pupils are equal, round, and reactive to light.  Neck:     Thyroid: No thyromegaly.  Cardiovascular:     Rate and Rhythm: Normal rate and regular rhythm.  Pulmonary:     Effort: Pulmonary effort is normal. No respiratory distress.     Breath sounds: Normal breath sounds. No stridor. No wheezing or rhonchi.  Abdominal:     General: Bowel sounds are normal. There is no distension.     Palpations: Abdomen is soft.     Tenderness: There is no abdominal tenderness.  Musculoskeletal:        General: No deformity. Normal range of motion.     Cervical back: No rigidity or tenderness.  Skin:    General: Skin is warm and dry.     Findings: No erythema.  Neurological:     Mental Status: He is alert and oriented to person, place, and time.     Cranial Nerves: No cranial nerve deficit.     Deep Tendon Reflexes: Reflexes are normal and symmetric.    Data Reviewed: Records reviewed  Assessment:   Obstructive sleep apnea -Continue BiPAP use  Pulmonary hypertension  Systolic heart failure  Continues to function well generally  Defective machine    Plan/Recommendations:  Last study was 5 years ago  Schedule him for a split-night study to ascertain severity of sleep disordered breathing and titrate to BiPAP  I will see him back in about 3  months  Encouraged to continue to stay active  Encouraged to call with any significant concerns  I spent 30 minutes dedicated to the care of this patient on the date of this encounter to include previsit review of records, face-to-face time with the patient discussing conditions above, post visit ordering of testing, clinical documentation with electronic health record and communicated necessary findings to members of the patient's care team  Sherrilyn Rist MD [facility] 05/15/2021, 12:24 PM  CC: Neale Burly,  MD   

## 2021-05-29 ENCOUNTER — Encounter: Payer: Medicaid Other | Admitting: Pulmonary Disease

## 2021-06-20 ENCOUNTER — Ambulatory Visit: Payer: Medicaid Other | Attending: Pulmonary Disease | Admitting: Pulmonary Disease

## 2021-06-20 ENCOUNTER — Other Ambulatory Visit: Payer: Self-pay

## 2021-06-20 DIAGNOSIS — G4733 Obstructive sleep apnea (adult) (pediatric): Secondary | ICD-10-CM | POA: Diagnosis not present

## 2021-06-20 DIAGNOSIS — G4736 Sleep related hypoventilation in conditions classified elsewhere: Secondary | ICD-10-CM | POA: Diagnosis not present

## 2021-06-28 ENCOUNTER — Telehealth: Payer: Self-pay | Admitting: Pulmonary Disease

## 2021-06-28 NOTE — Procedures (Signed)
POLYSOMNOGRAPHY  Last, First: Ross Schmidt, Ross Schmidt MRN: [id number] Gender: Male Age (years): 35 Weight (lbs): 347 DOB: 15-Nov-1985 BMI: 51 Primary Care: No PCP Epworth Score: N/A Referring: Tomma Lightning MD Technician: Peak, Robert InterpretingTomma Lightning MD Study Type: Split Night BiPAP Ordered Study Type: Split Night CPAP Study date: 06/20/2021 Location: [facility] CLINICAL INFORMATION Ross Schmidt is a 35 year old Male and was referred to the sleep center for evaluation of N/A. Indications include N/A.  MEDICATIONS Patient self administered medications include: N/A. Medications administered during study include No sleep medicine administered.  SLEEP STUDY TECHNIQUE The patient underwent an attended overnight level one polysomnography titration to assess the effects of BIPAP therapy. The following variables were monitored: EEG (C4-A1, C3-A2, O1-A2, O2-A1), EOG, submental and leg EMG, ECG, oxyhemoglobin saturation by pulse oximetry, thoracic and abdominal respiratory effort belts, nasal/oral airflow by pressure sensor, body position sensor and snoring sensor. BIPAP pressure was titrated to eliminate apneas, hypopneas and oxygen desaturation.Hypopneas were scored per AASM definition IB (4% desaturation)  The NPSG portion of the study ended at 1:19:18 AM. The BiPAP titration was initiated at 1:26:26 AM AM with the BIPAP portion of the study ending at 4:42:14 AM.  TECHNICIAN COMMENTS Comments added by Technician: Patient qualified for split with an AHI > 15/hr during the first 2 hours of sleep time. Comments added by Scorer: N/A SLEEP ARCHITECTURE The recording time for the entire night was 425.4 minutes.  The diagnostic portion was initiated at 9:36:50 PM and terminated at 1:19:18 AM. The time in bed was 222.5 minutes. EEG confirmed total sleep time was 157.0 minutes yielding a sleep efficiency of 70.6%. Sleep onset after lights out was 1.6 minutes with a REM latency of N/A  minutes. The patient spent 15.92% of the night in stage N1 sleep, 84.08 % in stage N2 sleep, 0.00% in stage N3 and 0% in REM. The Arousal Index was 27.9/hour.  The titration portion was initiated at 1:26:26 AM and terminated at 4:42:14 AM. The time in bed was 195.8 minutes. EEG confirmed total sleep time was 176.3 minutes yielding a sleep efficiency of 90.0%. Sleep onset after BiPAP initiation was 12.0 minutes with a REM latency of 60.0 minutes. The patient spent 3.97% of the night in stage N1 sleep, 76.46% in stage N2 sleep, 0.00% in stage N3 and 19.6% in REM. The Arousal Index was 5.4/hour. RESPIRATORY PARAMETERS During the diagnostic portion, there were a total of 372 respiratory disturbances recorded; 236 apneas ( 226 obstructive, 0 mixed, 10 central), 136 hypopneas and 0 RERAs. The apnea/hypopnea index 142.2 was events/hour and the RDI was 142.2 events/hour. The central sleep apnea index was 3.8 events/hour. The REM AHI was N/A/h and NREM AHI was 142.2/h. The REM RDI was 0.0 /h and NREM RDI was 142.2/h. The supine AHI was 118.2/h, and the non supine AHI was 148.8/h; supine during 21.66% of sleep. The supine RDI was 118.2 /h, and the non supine RDI was 148.78/h. Respiratory disturbances were associated with oxygen desaturation down to a nadir of 56.00% during sleep. The mean oxygen saturation during the study was 76.67%. The cumulative time under 88% oxygen saturation was 120 minutes.  During the titration portion, the apnea/hypopnea index (AHI) was 81.0 events/hour and the RDI was 81.0 events/hour. The central sleep apnea index was 0.0 events/hour. The most appropriate setting of BiPAP was not determined. Respiratory events persisted at all settings. Supplemental oxygen was not administered during the study. LEG MOVEMENT DATA The periodic limb movement index was 0.00/hour  with an associated arousal index of /hour. CARDIAC DATA The underlying cardiac rhythm was most consistent with sinus rhythm. Mean  heart rate was 91.54 during diagnostic portion and 88.27 during titration portion of study. Additional rhythm abnormalities include None.  IMPRESSIONS - EKG showed no cardiac abnormalities. - The patient snored with loud snoring volume. - Severe Obstructive Sleep apnea(OSA) Sub-Optimal pressure attained. - No Significant Central Sleep Apnea (CSA) - Severe Oxygen Desaturation - No significant periodic leg movements(PLMs) during sleep. However, no significant associated arousals. - Reduced sleep efficiency, short primary sleep latency, no REM sleep latency and no slow wave latency.  DIAGNOSIS - Obstructive Sleep Apnea (G47.33) - Nocturnal Hypoxemia (G47.36) - Patient currently on BIPAP at setting of 25/20  RECOMMENDATIONS - Recommend continuing BIPAP at 25/20 with clinical monitoring. - Avoid alcohol, sedatives and other CNS depressants that may worsen sleep apnea and disrupt normal sleep architecture. - Sleep hygiene should be reviewed to assess factors that may improve sleep quality. - Weight management and regular exercise should be initiated or continued.  [Electronically signed] 06/28/2021 12:27 PM  Virl [place] MD NPI: [id number]

## 2021-06-28 NOTE — Telephone Encounter (Signed)
Call patient  Sleep study result  Date of study: 06/20/2021  Impression: Severe obstructive sleep apnea  Suboptimal pressures achieved -Pressures was not able to be titrated high enough compared to current home pressures   Recommendation: DME referral  Recommend BiPAP therapy  BiPAP 25/20 with heated humidification with patient's mask of choice  Encourage weight loss measures  Follow-up in the office 4 to 6 weeks following initiation of treatment

## 2021-07-02 NOTE — Telephone Encounter (Signed)
ATC, left VM. 

## 2021-07-03 NOTE — Telephone Encounter (Signed)
ATC, left VM. 

## 2021-12-02 ENCOUNTER — Ambulatory Visit (INDEPENDENT_AMBULATORY_CARE_PROVIDER_SITE_OTHER): Payer: Medicaid Other | Admitting: Internal Medicine

## 2021-12-02 ENCOUNTER — Encounter: Payer: Self-pay | Admitting: Internal Medicine

## 2021-12-02 ENCOUNTER — Other Ambulatory Visit: Payer: Self-pay

## 2021-12-02 VITALS — BP 206/108 | HR 90 | Ht 69.0 in | Wt 346.0 lb

## 2021-12-02 DIAGNOSIS — G4733 Obstructive sleep apnea (adult) (pediatric): Secondary | ICD-10-CM

## 2021-12-02 DIAGNOSIS — E782 Mixed hyperlipidemia: Secondary | ICD-10-CM

## 2021-12-02 DIAGNOSIS — Z9989 Dependence on other enabling machines and devices: Secondary | ICD-10-CM

## 2021-12-02 DIAGNOSIS — I10 Essential (primary) hypertension: Secondary | ICD-10-CM

## 2021-12-02 DIAGNOSIS — I5022 Chronic systolic (congestive) heart failure: Secondary | ICD-10-CM | POA: Diagnosis not present

## 2021-12-02 MED ORDER — SACUBITRIL-VALSARTAN 49-51 MG PO TABS: 1.0000 | ORAL_TABLET | Freq: Two times a day (BID) | ORAL | 6 refills | Status: DC

## 2021-12-02 NOTE — Progress Notes (Signed)
Cardiology Office Note  Date: 12/02/2021   ID: Ross Schmidt, DOB Jun 04, 1986, MRN [id number]  PCP:  Neale Burly, MD  Cardiologist:  None Electrophysiologist:  None   Chief Complaint: Follow-up chronic systolic heart failure  History of Present Illness: Ross Schmidt is a 36 y.o. male with a history of history of chronic systolic heart failure, COPD, diabetes, HLD, HTN, pulmonary hypertension, ischemic cardiomyopathy, malignant hypertension, obesity, sleep apnea  Last encounter with Dr. Bronson Ing via telemedicine 10/03/2019.  Chronic exertional dyspnea was stable.  Had some mild leg swelling but had not needed to use Lasix.  He was taking Coreg 25 mg p.o. twice daily and Entresto 49/51 mg p.o. twice daily with hydralazine 100 mg 3 times daily.  He was compliant with BiPAP for sleep apnea.  Last echocardiogram showed LVEF of 30 to 35%.  He was symptomatically stable.  He was instructed to weigh himself daily and if developing bilateral leg edema or increased shortness of breath with weight gain of 3 pounds in 24 hours to take Lasix.  At that point he had not had to use Lasix recently.  A follow-up echocardiogram was ordered.  If LVEF remained severely reduced would consider EP referral for AICD consideration.   Follow-up echocardiogram on 11/23/2019 showed significant improvement of EF to 60 to 65%.  Moderately increased concentric LVH.  Indeterminate diastolic filling due to EA fusion.  He is here for 1 year follow-up.  He denies any recent acute illnesses or hospitalizations.  His blood pressure is significantly high on arrival today at 176/120.  He has not taken any of his antihypertensive medications nor his [name] yet because he states he usually does not take his medication until after he eats.  He denies any anginal or exertional symptoms, palpitations or arrhythmias.  Denies any PND, orthopnea, orthostatic symptoms, CVA or TIA like symptoms.  Denies any bleeding issues,  claudication-like symptoms, DVT or PE-like symptoms.  Has some occasional lower extremity edema for which he takes Lasix.  States he recently saw his primary care provider and stated his hemoglobin A1c was elevated but otherwise lab work was okay per his statement.  History of morbid obesity.  States he has lost some weight recently.  He has lost around 10 pounds since December of last year.  He has significant sleep apnea and uses BiPAP at home and states she is compliant with therapy.  12/02/2021  Ross Schmidt is seen today in follow-up.  He was previously followed by Levell July, NP after being seen by Dr. Bronson Ing.  He has a history as above of a nonischemic cardiomyopathy with EF as low as 30% that normalized by echo in 2021.  He is done fairly well although today blood pressure was markedly elevated 206/108.  He says he has been compliant with his medicine except for [facility] which she said he ran out of.  He recently had a sleep study which was recommended for him to continue his BiPAP with oxygen therapy although he said he has had difficulty with his equipment and I encouraged him to reach out to the pulmonologist to see if he can get some new equipment.  Weight has been fairly stable although had come down still remains excessive with a BMI 51 and a weight of 346 pounds.  He denies any swelling, or significant orthopnea.   Past Medical History:  Diagnosis Date   Asthma    CHF (congestive heart failure) (HCC)    COPD (chronic obstructive  pulmonary disease) ([place])    Diabetes mellitus without complication ([place])    HLD (hyperlipidemia) 08/11/2016   Hypertension    Obesity    Oxygen deficiency    Premature baby    Sleep apnea     Past Surgical History:  Procedure Laterality Date   LEFT HEART CATH AND CORONARY ANGIOGRAPHY N/A 06/01/2017   Procedure: LEFT HEART CATH AND CORONARY ANGIOGRAPHY;  Surgeon: Jettie Booze, MD;  Location: [facility];  Service: Cardiovascular;   Laterality: N/A;   TONSILLECTOMY      Current Outpatient Medications  Medication Sig Dispense Refill   allopurinol (ZYLOPRIM) 300 MG tablet Take 300 mg by mouth daily.     amLODipine (NORVASC) 5 MG tablet Take 1 tablet (5 mg total) by mouth daily. 90 tablet 3   atorvastatin (LIPITOR) 40 MG tablet TAKE 1 TABLET BY MOUTH DAILY 90 tablet 1   blood glucose meter kit and supplies KIT Dispense based on patient and insurance preference. Use up to four times daily as directed. (FOR ICD-9 250.00, 250.01). 1 each 0   carvedilol (COREG) 25 MG tablet TAKE ONE TABLET BY MOUTH TWICE DAILY WITH FOOD 180 tablet 1   furosemide (LASIX) 40 MG tablet Take 1 tablet (40 mg total) by mouth as needed.     hydrALAZINE (APRESOLINE) 100 MG tablet Take 1 tablet (100 mg total) by mouth 3 (three) times daily. 270 tablet 1   JARDIANCE 25 MG TABS tablet Take 25 mg by mouth daily.     metFORMIN (GLUCOPHAGE) 1000 MG tablet Take 1 tablet (1,000 mg total) by mouth 2 (two) times daily. 180 tablet 1   sacubitril-valsartan (ENTRESTO) 49-51 MG Take 1 tablet by mouth 2 (two) times daily. 28 tablet 0   TRULICITY 1.5 TA/5.6PV SOPN Inject into the skin once a week.     No current facility-administered medications for this visit.   Allergies:  Morphine and related and Nicoderm [nicotine]   Social History: The patient  reports that he quit smoking about 5 years ago. His smoking use included cigarettes. He started smoking about 24 years ago. He smoked an average of .25 packs per day. He has never used smokeless tobacco. He reports that he does not drink alcohol and does not use drugs.   Family History: The patient's family history includes Diabetes in his maternal grandmother and maternal uncle; Heart disease in his maternal uncle; Hypertension in his maternal grandmother; Miscarriages / [place] in his maternal grandfather.   ROS:  Please see the history of present illness. Otherwise, complete review of systems is positive for none.   All other systems are reviewed and negative.   Physical Exam: VS:  BP (!) 206/108    Pulse 90    Ht '5\' 9"'  (1.753 m)    Wt (!) 346 lb (156.9 kg)    SpO2 92%    BMI 51.10 kg/m , BMI Body mass index is 51.1 kg/m.  Wt Readings from Last 3 Encounters:  12/02/21 (!) 346 lb (156.9 kg)  05/15/21 (!) 347 lb 6.4 oz (157.6 kg)  10/10/20 (!) 345 lb 9.6 oz (156.8 kg)    General appearance: alert, no distress, and morbidly obese Neck: no carotid bruit, no JVD, and thyroid not enlarged, symmetric, no tenderness/mass/nodules Lungs: clear to auscultation bilaterally Heart: regular rate and rhythm Abdomen: soft, non-tender; bowel sounds normal; no masses,  no organomegaly and morbidly obese Extremities: extremities normal, atraumatic, no cyanosis or edema Pulses: 2+ and symmetric Skin: Skin color, texture,  turgor normal. No rashes or lesions Neurologic: Grossly normal Psych: Pleasant   ECG:   Normal sinus rhythm at 91, incomplete right bundle branch block, pulmonary disease pattern, inferolateral T wave changes, QTc 4 and 72 ms-personally reviewed  Recent Labwork: No results found for requested labs within last 8760 hours.     Component Value Date/Time   CHOL 134 12/15/2016 1602   TRIG 115 12/15/2016 1602   HDL 24 (L) 12/15/2016 1602   CHOLHDL 5.6 (H) 12/15/2016 1602   VLDL 23 12/15/2016 1602   LDLCALC 87 12/15/2016 1602    Other Studies Reviewed Today:  Echocardiogram 11/23/2019 1. Left ventricular ejection fraction, by estimation, is 60 to 65%. The left ventricle has normal function. The left ventrical is not well visualized. There is moderately increased concentric left ventricular hypertrophy. Indeterminate diastolic filling due to E-A fusion. 2. Right ventricular systolic function is normal. The right ventricular size is not well visualized. There is normal pulmonary artery systolic pressure. 3. The mitral valve is grossly normal. no evidence of mitral valve regurgitation. 4. The  aortic valve was not well visualized. Aortic valve regurgitation is not visualized. Comparison(s): Echocardiogram done 09/07/16 showed an EF of 35%. Cardiac catheterization doen 06/01/17 showed an EF between 35-45%.    06/01/2017 LEFT HEART CATH AND CORONARY ANGIOGRAPHY    Mid LAD lesion, 25 %stenosed. There is moderate left ventricular systolic dysfunction. The left ventricular ejection fraction is 35-45% by visual estimate. LV end diastolic pressure is moderately elevated. LVEDP 29 mm Hg. There is no mitral valve regurgitation. There is no aortic valve stenosis.   Non-obstructive CAD.  He needs aggressive risk factor modification including weight loss.     Nonischemic cardiomyopathy.   Diagnostic Dominance: Left     Assessment : 1. Chronic systolic heart failure ([place]) 2. Essential hypertension 3. Mixed hyperlipidemia 4. OSA on BiPAP 5. Morbid obesity  Plan: Ross Schmidt is noted to have marked hypertension today.  He says he is only been out of his Entresto.  We will go ahead and refill that today and have encouraged him to get back on it.  He should monitor blood pressures and reach out to [place] with those readings.  He does not appear to be in decompensated heart failure today.  I do not see need for repeat echo at this time.  We will plan follow-up for clinical evaluation and repeat blood pressure in about 3 months.  Pixie Casino, MD, [facility], [place] Director of the [facility]  Cardiovascular [facility] Reduction [facility] Diplomate of the American Board of Clinical Lipidology Attending Cardiologist  Direct Dial: [phone]   Fax: [phone]  Website:  [email or web address]

## 2021-12-02 NOTE — Patient Instructions (Addendum)
Follow-Up: Follow up with Dr. Rennis Golden in 3 months.  Any Other Special Instructions Will Be Listed Below (If Applicable).     If you need a refill on your cardiac medications before your next appointment, please call your pharmacy.

## 2022-01-01 ENCOUNTER — Ambulatory Visit: Payer: Medicaid Other | Admitting: Internal Medicine

## 2022-03-06 ENCOUNTER — Ambulatory Visit: Payer: Medicaid Other | Admitting: Internal Medicine

## 2022-04-09 ENCOUNTER — Ambulatory Visit: Payer: Medicaid Other | Admitting: Internal Medicine

## 2022-05-05 ENCOUNTER — Ambulatory Visit: Payer: Medicaid Other | Admitting: Internal Medicine

## 2022-07-06 ENCOUNTER — Observation Stay (HOSPITAL_COMMUNITY): Payer: Medicaid Other

## 2022-07-06 ENCOUNTER — Observation Stay (HOSPITAL_COMMUNITY)
Admission: EM | Admit: 2022-07-06 | Discharge: 2022-07-07 | Disposition: A | Discharge status: Home / Self Care | Payer: Medicaid Other | Attending: Internal Medicine | Admitting: Internal Medicine

## 2022-07-06 ENCOUNTER — Emergency Department (HOSPITAL_COMMUNITY): Payer: Medicaid Other

## 2022-07-06 ENCOUNTER — Other Ambulatory Visit: Payer: Self-pay

## 2022-07-06 ENCOUNTER — Observation Stay (HOSPITAL_BASED_OUTPATIENT_CLINIC_OR_DEPARTMENT_OTHER): Payer: Medicaid Other

## 2022-07-06 ENCOUNTER — Other Ambulatory Visit (HOSPITAL_COMMUNITY): Payer: Self-pay | Admitting: *Deleted

## 2022-07-06 ENCOUNTER — Encounter (HOSPITAL_COMMUNITY): Payer: Self-pay | Admitting: *Deleted

## 2022-07-06 DIAGNOSIS — E119 Type 2 diabetes mellitus without complications: Secondary | ICD-10-CM | POA: Diagnosis not present

## 2022-07-06 DIAGNOSIS — R202 Paresthesia of skin: Secondary | ICD-10-CM | POA: Diagnosis present

## 2022-07-06 DIAGNOSIS — G8194 Hemiplegia, unspecified affecting left nondominant side: Secondary | ICD-10-CM | POA: Diagnosis not present

## 2022-07-06 DIAGNOSIS — Z6841 Body Mass Index (BMI) 40.0 and over, adult: Secondary | ICD-10-CM | POA: Insufficient documentation

## 2022-07-06 DIAGNOSIS — J449 Chronic obstructive pulmonary disease, unspecified: Secondary | ICD-10-CM | POA: Diagnosis not present

## 2022-07-06 DIAGNOSIS — I110 Hypertensive heart disease with heart failure: Secondary | ICD-10-CM | POA: Insufficient documentation

## 2022-07-06 DIAGNOSIS — I639 Cerebral infarction, unspecified: Principal | ICD-10-CM | POA: Diagnosis present

## 2022-07-06 DIAGNOSIS — I10 Essential (primary) hypertension: Secondary | ICD-10-CM | POA: Diagnosis present

## 2022-07-06 DIAGNOSIS — E785 Hyperlipidemia, unspecified: Secondary | ICD-10-CM | POA: Diagnosis present

## 2022-07-06 DIAGNOSIS — Z87891 Personal history of nicotine dependence: Secondary | ICD-10-CM | POA: Insufficient documentation

## 2022-07-06 DIAGNOSIS — G4730 Sleep apnea, unspecified: Secondary | ICD-10-CM | POA: Diagnosis not present

## 2022-07-06 DIAGNOSIS — I509 Heart failure, unspecified: Secondary | ICD-10-CM | POA: Diagnosis not present

## 2022-07-06 DIAGNOSIS — Z7984 Long term (current) use of oral hypoglycemic drugs: Secondary | ICD-10-CM | POA: Diagnosis not present

## 2022-07-06 DIAGNOSIS — Z7985 Long-term (current) use of injectable non-insulin antidiabetic drugs: Secondary | ICD-10-CM | POA: Insufficient documentation

## 2022-07-06 DIAGNOSIS — M6281 Muscle weakness (generalized): Secondary | ICD-10-CM | POA: Insufficient documentation

## 2022-07-06 DIAGNOSIS — R2681 Unsteadiness on feet: Secondary | ICD-10-CM | POA: Diagnosis not present

## 2022-07-06 DIAGNOSIS — E1169 Type 2 diabetes mellitus with other specified complication: Secondary | ICD-10-CM | POA: Diagnosis present

## 2022-07-06 DIAGNOSIS — I6389 Other cerebral infarction: Secondary | ICD-10-CM

## 2022-07-06 DIAGNOSIS — I429 Cardiomyopathy, unspecified: Secondary | ICD-10-CM | POA: Diagnosis not present

## 2022-07-06 DIAGNOSIS — E669 Obesity, unspecified: Secondary | ICD-10-CM | POA: Diagnosis present

## 2022-07-06 DIAGNOSIS — Z79899 Other long term (current) drug therapy: Secondary | ICD-10-CM | POA: Insufficient documentation

## 2022-07-06 HISTORY — DX: Cerebral infarction, unspecified: I63.9

## 2022-07-06 LAB — CBG MONITORING, ED: Glucose-Capillary: 170 mg/dL — ABNORMAL HIGH (ref 70–99)

## 2022-07-06 LAB — ECHOCARDIOGRAM COMPLETE
AR max vel: 2.11 cm2
AV Area VTI: 2.46 cm2
AV Area mean vel: 2.43 cm2
AV Mean grad: 4 mmHg
AV Peak grad: 9.2 mmHg
Ao pk vel: 1.52 m/s
Area-P 1/2: 3.06 cm2
Height: 68 in
S' Lateral: 2.9 cm
Weight: 5424 oz

## 2022-07-06 LAB — CBC WITH DIFFERENTIAL/PLATELET
Abs Immature Granulocytes: 0.02 10*3/uL (ref 0.00–0.07)
Basophils Absolute: 0.1 10*3/uL (ref 0.0–0.1)
Basophils Relative: 1 %
Eosinophils Absolute: 0.1 10*3/uL (ref 0.0–0.5)
Eosinophils Relative: 1 %
HCT: 47.7 % (ref 39.0–52.0)
Hemoglobin: 14.4 g/dL (ref 13.0–17.0)
Immature Granulocytes: 0 %
Lymphocytes Relative: 27 %
Lymphs Abs: 2.1 10*3/uL (ref 0.7–4.0)
MCH: 27.3 pg (ref 26.0–34.0)
MCHC: 30.2 g/dL (ref 30.0–36.0)
MCV: 90.5 fL (ref 80.0–100.0)
Monocytes Absolute: 0.8 10*3/uL (ref 0.1–1.0)
Monocytes Relative: 10 %
Neutro Abs: 4.7 10*3/uL (ref 1.7–7.7)
Neutrophils Relative %: 61 %
Platelets: 272 10*3/uL (ref 150–400)
RBC: 5.27 MIL/uL (ref 4.22–5.81)
RDW: 14.6 % (ref 11.5–15.5)
WBC: 7.7 10*3/uL (ref 4.0–10.5)
nRBC: 0 % (ref 0.0–0.2)

## 2022-07-06 LAB — COMPREHENSIVE METABOLIC PANEL
ALT: 20 U/L (ref 0–44)
AST: 19 U/L (ref 15–41)
Albumin: 3.9 g/dL (ref 3.5–5.0)
Alkaline Phosphatase: 69 U/L (ref 38–126)
Anion gap: 6 (ref 5–15)
BUN: 12 mg/dL (ref 6–20)
CO2: 31 mmol/L (ref 22–32)
Calcium: 8.6 mg/dL — ABNORMAL LOW (ref 8.9–10.3)
Chloride: 103 mmol/L (ref 98–111)
Creatinine, Ser: 1.12 mg/dL (ref 0.61–1.24)
GFR, Estimated: >60 mL/min (ref >60)
Glucose, Bld: 169 mg/dL — ABNORMAL HIGH (ref 70–99)
Potassium: 4.1 mmol/L (ref 3.5–5.1)
Sodium: 140 mmol/L (ref 135–145)
Total Bilirubin: 0.9 mg/dL (ref 0.3–1.2)
Total Protein: 7.7 g/dL (ref 6.5–8.1)

## 2022-07-06 LAB — HEMOGLOBIN A1C
Hgb A1c MFr Bld: 8.3 % — ABNORMAL HIGH (ref 4.8–5.6)
Mean Plasma Glucose: 191.51 mg/dL

## 2022-07-06 LAB — GLUCOSE, CAPILLARY: Glucose-Capillary: 231 mg/dL — ABNORMAL HIGH (ref 70–99)

## 2022-07-06 LAB — LIPID PANEL
Cholesterol: 140 mg/dL (ref 0–200)
HDL: 28 mg/dL — ABNORMAL LOW (ref >40)
LDL Cholesterol: 92 mg/dL (ref 0–99)
Total CHOL/HDL Ratio: 5 RATIO
Triglycerides: 101 mg/dL (ref <150)
VLDL: 20 mg/dL (ref 0–40)

## 2022-07-06 LAB — TROPONIN I (HIGH SENSITIVITY)
Troponin I (High Sensitivity): 51 ng/L — ABNORMAL HIGH (ref <18)
Troponin I (High Sensitivity): 52 ng/L — ABNORMAL HIGH (ref <18)

## 2022-07-06 LAB — HIV ANTIBODY (ROUTINE TESTING W REFLEX): HIV Screen 4th Generation wRfx: NONREACTIVE

## 2022-07-06 MED ORDER — ALLOPURINOL 100 MG PO TABS
300.0000 mg | ORAL_TABLET | Freq: Every day | ORAL | Status: DC
  Administered 2022-07-06 – 2022-07-07 (×2): 300 mg via ORAL
  Filled 2022-07-06: qty 3
  Filled 2022-07-06: qty 1

## 2022-07-06 MED ORDER — ENOXAPARIN SODIUM 40 MG/0.4ML IJ SOSY: 40.0000 mg | PREFILLED_SYRINGE | INTRAMUSCULAR | Status: DC

## 2022-07-06 MED ORDER — LOSARTAN POTASSIUM-HCTZ 100-25 MG PO TABS: 1.0000 | ORAL_TABLET | Freq: Every day | ORAL | Status: DC

## 2022-07-06 MED ORDER — ASPIRIN 325 MG PO TABS
ORAL_TABLET | ORAL | Status: AC
  Filled 2022-07-06: qty 1

## 2022-07-06 MED ORDER — ASPIRIN 300 MG RE SUPP: 300.0000 mg | Freq: Every day | RECTAL | Status: DC

## 2022-07-06 MED ORDER — LOSARTAN POTASSIUM 50 MG PO TABS
100.0000 mg | ORAL_TABLET | Freq: Every day | ORAL | Status: DC
  Administered 2022-07-07: 100 mg via ORAL
  Filled 2022-07-06: qty 2

## 2022-07-06 MED ORDER — INSULIN ASPART 100 UNIT/ML IJ SOLN
0.0000 [IU] | Freq: Three times a day (TID) | INTRAMUSCULAR | Status: DC
  Filled 2022-07-06: qty 1

## 2022-07-06 MED ORDER — STROKE: EARLY STAGES OF RECOVERY BOOK
Freq: Once | Status: AC
  Filled 2022-07-06: qty 1

## 2022-07-06 MED ORDER — ACETAMINOPHEN 650 MG RE SUPP: 650.0000 mg | RECTAL | Status: DC | PRN

## 2022-07-06 MED ORDER — ATORVASTATIN CALCIUM 40 MG PO TABS
40.0000 mg | ORAL_TABLET | Freq: Every day | ORAL | Status: DC
  Administered 2022-07-07: 40 mg via ORAL
  Filled 2022-07-06: qty 1

## 2022-07-06 MED ORDER — ASPIRIN 325 MG PO TABS: 325.0000 mg | ORAL_TABLET | Freq: Every day | ORAL | Status: DC

## 2022-07-06 MED ORDER — PERFLUTREN LIPID MICROSPHERE
1.0000 mL | INTRAVENOUS | Status: AC | PRN
  Administered 2022-07-06: 2 mL via INTRAVENOUS

## 2022-07-06 MED ORDER — ENOXAPARIN SODIUM 40 MG/0.4ML IJ SOSY
40.0000 mg | PREFILLED_SYRINGE | INTRAMUSCULAR | Status: DC
  Administered 2022-07-06: 40 mg via SUBCUTANEOUS
  Filled 2022-07-06: qty 0.4

## 2022-07-06 MED ORDER — HYDROCHLOROTHIAZIDE 25 MG PO TABS
25.0000 mg | ORAL_TABLET | Freq: Every day | ORAL | Status: DC
  Administered 2022-07-07: 25 mg via ORAL
  Filled 2022-07-06: qty 1

## 2022-07-06 MED ORDER — CLOPIDOGREL BISULFATE 75 MG PO TABS
75.0000 mg | ORAL_TABLET | Freq: Every day | ORAL | Status: DC
  Administered 2022-07-06 – 2022-07-07 (×2): 75 mg via ORAL
  Filled 2022-07-06 (×2): qty 1

## 2022-07-06 MED ORDER — ACETAMINOPHEN 160 MG/5ML PO SOLN: 650.0000 mg | ORAL | Status: DC | PRN

## 2022-07-06 MED ORDER — ACETAMINOPHEN 325 MG PO TABS
650.0000 mg | ORAL_TABLET | ORAL | Status: DC | PRN
  Administered 2022-07-06: 650 mg via ORAL
  Filled 2022-07-06: qty 2

## 2022-07-06 MED ORDER — ASPIRIN 81 MG PO CHEW
81.0000 mg | CHEWABLE_TABLET | Freq: Every day | ORAL | Status: DC
  Administered 2022-07-07: 81 mg via ORAL
  Filled 2022-07-06: qty 1

## 2022-07-06 MED ORDER — AMLODIPINE BESYLATE 5 MG PO TABS
10.0000 mg | ORAL_TABLET | Freq: Every day | ORAL | Status: DC
  Administered 2022-07-07: 10 mg via ORAL
  Filled 2022-07-06: qty 2

## 2022-07-06 MED ORDER — CARVEDILOL 12.5 MG PO TABS
25.0000 mg | ORAL_TABLET | Freq: Two times a day (BID) | ORAL | Status: DC
  Administered 2022-07-06 – 2022-07-07 (×2): 25 mg via ORAL
  Filled 2022-07-06 (×2): qty 2

## 2022-07-06 MED ORDER — CLONIDINE HCL 0.1 MG PO TABS
0.1000 mg | ORAL_TABLET | Freq: Two times a day (BID) | ORAL | Status: DC
  Administered 2022-07-06 – 2022-07-07 (×2): 0.1 mg via ORAL
  Filled 2022-07-06 (×2): qty 1

## 2022-07-06 MED ORDER — ACETAMINOPHEN 325 MG PO TABS: 650.0000 mg | ORAL_TABLET | ORAL | Status: DC | PRN

## 2022-07-06 MED ORDER — EMPAGLIFLOZIN 25 MG PO TABS
25.0000 mg | ORAL_TABLET | Freq: Every day | ORAL | Status: DC
  Administered 2022-07-07: 25 mg via ORAL
  Filled 2022-07-06 (×3): qty 1

## 2022-07-06 MED ORDER — ATORVASTATIN CALCIUM 40 MG PO TABS: 40.0000 mg | ORAL_TABLET | Freq: Every day | ORAL | Status: DC

## 2022-07-06 MED ORDER — SENNOSIDES-DOCUSATE SODIUM 8.6-50 MG PO TABS: 1.0000 | ORAL_TABLET | Freq: Every evening | ORAL | Status: DC | PRN

## 2022-07-06 MED ORDER — HYDRALAZINE HCL 20 MG/ML IJ SOLN
10.0000 mg | Freq: Once | INTRAMUSCULAR | Status: AC
  Administered 2022-07-06: 10 mg via INTRAVENOUS
  Filled 2022-07-06: qty 1

## 2022-07-06 MED ORDER — HYDRALAZINE HCL 25 MG PO TABS
100.0000 mg | ORAL_TABLET | Freq: Three times a day (TID) | ORAL | Status: DC
  Administered 2022-07-06 – 2022-07-07 (×2): 100 mg via ORAL
  Filled 2022-07-06 (×3): qty 4

## 2022-07-06 MED ORDER — STROKE: EARLY STAGES OF RECOVERY BOOK
Freq: Once | Status: DC
  Filled 2022-07-06: qty 1

## 2022-07-06 MED ORDER — INSULIN ASPART 100 UNIT/ML IJ SOLN: 0.0000 [IU] | Freq: Every day | INTRAMUSCULAR | Status: DC

## 2022-07-06 NOTE — ED Notes (Signed)
Pt in bed, echo tech and vasc tech at bedside for exam

## 2022-07-06 NOTE — Plan of Care (Signed)
  Problem: Education: Goal: Ability to describe self-care measures that may prevent or decrease complications (Diabetes Survival Skills Education) will improve Outcome: Progressing Goal: Individualized Educational Video(s) Outcome: Progressing   Problem: Coping: Goal: Ability to adjust to condition or change in health will improve Outcome: Progressing   Problem: Fluid Volume: Goal: Ability to maintain a balanced intake and output will improve Outcome: Progressing   Problem: Health Behavior/Discharge Planning: Goal: Ability to identify and utilize available resources and services will improve Outcome: Progressing Goal: Ability to manage health-related needs will improve Outcome: Progressing   Problem: Metabolic: Goal: Ability to maintain appropriate glucose levels will improve Outcome: Progressing   Problem: Nutritional: Goal: Maintenance of adequate nutrition will improve Outcome: Progressing Goal: Progress toward achieving an optimal weight will improve Outcome: Progressing   Problem: Skin Integrity: Goal: Risk for impaired skin integrity will decrease Outcome: Progressing   Problem: Tissue Perfusion: Goal: Adequacy of tissue perfusion will improve Outcome: Progressing   Problem: Education: Goal: Knowledge of General Education information will improve Description: Including pain rating scale, medication(s)/side effects and non-pharmacologic comfort measures Outcome: Progressing   Problem: Health Behavior/Discharge Planning: Goal: Ability to manage health-related needs will improve Outcome: Progressing   Problem: Clinical Measurements: Goal: Ability to maintain clinical measurements within normal limits will improve Outcome: Progressing Goal: Will remain free from infection Outcome: Progressing Goal: Diagnostic test results will improve Outcome: Progressing Goal: Respiratory complications will improve Outcome: Progressing Goal: Cardiovascular complication will  be avoided Outcome: Progressing   Problem: Activity: Goal: Risk for activity intolerance will decrease Outcome: Progressing   Problem: Nutrition: Goal: Adequate nutrition will be maintained Outcome: Progressing   Problem: Coping: Goal: Level of anxiety will decrease Outcome: Progressing   Problem: Elimination: Goal: Will not experience complications related to bowel motility Outcome: Progressing Goal: Will not experience complications related to urinary retention Outcome: Progressing   Problem: Pain Managment: Goal: General experience of comfort will improve Outcome: Progressing   Problem: Safety: Goal: Ability to remain free from injury will improve Outcome: Progressing   Problem: Skin Integrity: Goal: Risk for impaired skin integrity will decrease Outcome: Progressing   Problem: Education: Goal: Knowledge of disease or condition will improve Outcome: Progressing Goal: Knowledge of secondary prevention will improve (SELECT ALL) Outcome: Progressing Goal: Knowledge of patient specific risk factors will improve (INDIVIDUALIZE FOR PATIENT) Outcome: Progressing   Problem: Coping: Goal: Will verbalize positive feelings about self Outcome: Progressing Goal: Will identify appropriate support needs Outcome: Progressing   Problem: Self-Care: Goal: Verbalization of feelings and concerns over difficulty with self-care will improve Outcome: Progressing Goal: Ability to communicate needs accurately will improve Outcome: Progressing

## 2022-07-06 NOTE — H&P (Addendum)
History and Physical    Ross Schmidt QIH:[id number] DOB: 07-13-1986 DOA: 07/06/2022  PCP: Carrolyn Meiers, MD   Patient coming from: PCP office  Chief Complaint: Elevated blood pressure with recent left arm weakness  HPI: Ross Schmidt is a 36 y.o. male with medical history significant for hypertension, dyslipidemia, cardiomyopathy, and type 2 diabetes who presented to the ED on encouragement by his PCP due to elevated blood pressure readings and persistent left arm weakness, numbness, and tingling.  His left arm numbness and weakness started approximately 11 days ago and he went to [facility] ED for evaluation at that time.  He was discharged that evening and told to follow-up with his PCP and was not able to obtain outpatient MRI.  He also had some left face and lip numbness about 5-6 days ago which appears to now have resolved.  He denies any trouble with his speech or swallowing.  He denies any trouble with his gait, no headaches, or vision changes noted.   ED Course: Vital signs with elevated blood pressure readings for which she has been given IV hydralazine.  Troponin of 51 and glucose 169.  MRI head and neck with findings of scattered right MCA distribution CVA and right MCA stenosis noted.  Neurology consulted for further evaluation.  Review of Systems: Reviewed as noted above, otherwise negative.  Past Medical History:  Diagnosis Date   Asthma    CHF (congestive heart failure) (HCC)    COPD (chronic obstructive pulmonary disease) (HCC)    CVA (cerebral vascular accident) ([place]) 07/06/2022   Diabetes mellitus without complication ([place])    HLD (hyperlipidemia) 08/11/2016   Hypertension    Obesity    Oxygen deficiency    Premature baby    Sleep apnea     Past Surgical History:  Procedure Laterality Date   LEFT HEART CATH AND CORONARY ANGIOGRAPHY N/A 06/01/2017   Procedure: LEFT HEART CATH AND CORONARY ANGIOGRAPHY;  Surgeon: Jettie Booze, MD;  Location:  [facility];  Service: Cardiovascular;  Laterality: N/A;   TONSILLECTOMY       reports that he quit smoking about 6 years ago. His smoking use included cigarettes. He started smoking about 24 years ago. He smoked an average of .25 packs per day. He has never used smokeless tobacco. He reports that he does not drink alcohol and does not use drugs.  Allergies  Allergen Reactions   Morphine And Related     Heart beat fast    Nicoderm [Nicotine]     Rash from the patch     Family History  Problem Relation Age of Onset   Diabetes Maternal Uncle    Heart disease Maternal Uncle    Diabetes Maternal Grandmother    Hypertension Maternal Grandmother    Miscarriages / Stillbirths Maternal Grandfather     Prior to Admission medications   Medication Sig Start Date End Date Taking? Authorizing Provider  allopurinol (ZYLOPRIM) 300 MG tablet Take 300 mg by mouth daily. 11/26/21  Yes [provider]  amLODipine (NORVASC) 5 MG tablet Take 1 tablet (5 mg total) by mouth daily. Patient taking differently: Take 10 mg by mouth daily. 10/10/20 07/06/22 Yes Verta Ellen., NP  atorvastatin (LIPITOR) 40 MG tablet TAKE 1 TABLET BY MOUTH DAILY Patient taking differently: Take 40 mg by mouth daily. 11/13/19  Yes Herminio Commons, MD  carvedilol (COREG) 25 MG tablet TAKE ONE TABLET BY MOUTH TWICE DAILY WITH FOOD Patient taking differently:  Take 25 mg by mouth 2 (two) times daily with a meal. 10/22/17  Yes Herminio Commons, MD  cloNIDine (CATAPRES) 0.1 MG tablet Take 0.1 mg by mouth 2 (two) times daily. 01/30/22  Yes [provider]  hydrALAZINE (APRESOLINE) 100 MG tablet Take 1 tablet (100 mg total) by mouth 3 (three) times daily. 03/13/20  Yes Strader, Fransisco Hertz, PA-C  JARDIANCE 25 MG TABS tablet Take 25 mg by mouth daily. 11/26/21  Yes [provider]  losartan-hydrochlorothiazide (HYZAAR) 100-25 MG tablet Take 1 tablet by mouth daily. 06/18/22  Yes [provider]  metFORMIN (GLUCOPHAGE) 1000 MG tablet Take 1 tablet (1,000 mg total) by mouth 2 (two) times daily. 06/03/17  Yes Jettie Booze, MD  sacubitril-valsartan (ENTRESTO) 49-51 MG Take 1 tablet by mouth 2 (two) times daily. 12/02/21  Yes Hilty, Nadean Corwin, MD  TRULICITY 1.5 AO/1.3YQ SOPN Inject into the skin once a week. 11/26/21  Yes [provider]  blood glucose meter kit and supplies KIT Dispense based on patient and insurance preference. Use up to four times daily as directed. (FOR ICD-9 250.00, 250.01). 08/26/16   Raylene Everts, MD    Physical Exam: Vitals:   07/06/22 1300 07/06/22 1315 07/06/22 1330 07/06/22 1345  BP: (!) 197/149 (!) 174/127 (!) 151/128 (!) 161/107  Pulse: 85 89 79 88  Resp: '18 17 18 ' (!) 21  Temp:      TempSrc:      SpO2: 97% 95%  94%  Weight:      Height:        Constitutional: NAD, calm, comfortable, morbidly obese Vitals:   07/06/22 1300 07/06/22 1315 07/06/22 1330 07/06/22 1345  BP: (!) 197/149 (!) 174/127 (!) 151/128 (!) 161/107  Pulse: 85 89 79 88  Resp: '18 17 18 ' (!) 21  Temp:      TempSrc:      SpO2: 97% 95%  94%  Weight:      Height:       Eyes: lids and conjunctivae normal Neck: normal, supple Respiratory: clear to auscultation bilaterally. Normal respiratory effort. No accessory muscle use.  Cardiovascular: Regular rate and rhythm, no murmurs. Abdomen: no tenderness, no distention. Bowel sounds positive.  Musculoskeletal:  No edema. Skin: no rashes, lesions, ulcers.  Psychiatric: Flat affect  Labs on Admission: I have personally reviewed following labs and imaging studies  CBC: Recent Labs  Lab 07/06/22 1048  WBC 7.7  NEUTROABS 4.7  HGB 14.4  HCT 47.7  MCV 90.5  PLT 657   Basic Metabolic Panel: Recent Labs  Lab 07/06/22 1048  NA 140  K 4.1  CL 103  CO2 31  GLUCOSE 169*  BUN 12  CREATININE 1.12  CALCIUM 8.6*   GFR: Estimated Creatinine Clearance: 131 mL/min (by C-G formula based on SCr of 1.12  mg/dL). Liver Function Tests: Recent Labs  Lab 07/06/22 1048  AST 19  ALT 20  ALKPHOS 69  BILITOT 0.9  PROT 7.7  ALBUMIN 3.9   No results for input(s): "LIPASE", "AMYLASE" in the last 168 hours. No results for input(s): "AMMONIA" in the last 168 hours. Coagulation Profile: No results for input(s): "INR", "PROTIME" in the last 168 hours. Cardiac Enzymes: No results for input(s): "CKTOTAL", "CKMB", "CKMBINDEX", "TROPONINI" in the last 168 hours. BNP (last 3 results) No results for input(s): "PROBNP" in the last 8760 hours. HbA1C: No results for input(s): "HGBA1C" in the last 72 hours. CBG: No results for input(s): "GLUCAP" in the last 168 hours.  Lipid Profile: No results for input(s): "CHOL", "HDL", "LDLCALC", "TRIG", "CHOLHDL", "LDLDIRECT" in the last 72 hours. Thyroid Function Tests: No results for input(s): "TSH", "T4TOTAL", "FREET4", "T3FREE", "THYROIDAB" in the last 72 hours. Anemia Panel: No results for input(s): "VITAMINB12", "FOLATE", "FERRITIN", "TIBC", "IRON", "RETICCTPCT" in the last 72 hours. Urine analysis: No results found for: "COLORURINE", "APPEARANCEUR", "LABSPEC", "PHURINE", "GLUCOSEU", "HGBUR", "BILIRUBINUR", "KETONESUR", "PROTEINUR", "UROBILINOGEN", "NITRITE", "LEUKOCYTESUR"  Radiological Exams on Admission: MR BRAIN WO CONTRAST  Result Date: 07/06/2022 CLINICAL DATA:  Cervical radiculopathy, no red flags; Neuro deficit, acute, stroke suspected EXAM: MRI HEAD WITHOUT CONTRAST MRI CERVICAL SPINE WITHOUT CONTRAST MRA HEAD WITHOUT CONTRAST TECHNIQUE: Multiplanar, multiecho pulse sequences of the brain and surrounding structures, and cervical spine, to include the craniocervical junction and cervicothoracic junction, were obtained without intravenous contrast. Angiographic images of the Circle of Willis were acquired using MRA technique without intravenous contrast. COMPARISON:  CT HEAD 06/25/2022 FINDINGS: MRI HEAD FINDINGS Brain: Scattered acute infarcts in the  right frontal and parietal lobes, predominant in the white matter. Mild associated edema without mass effect. No midline shift. No acute hemorrhage or mass lesion. No hydrocephalus. Vascular: See below. Skull and upper cervical spine: Normal marrow signal. Sinuses/Orbits: Clear sinuses.  No acute orbital findings. Other: No mastoid effusions. MRA HEAD FINDINGS Anterior circulation: Bilateral intracranial ICAs bilateral ACAs, and the left MCA are patent without proximal high-grade stenosis. Occlusion versus severe stenosis of the distal right M1 MCA with some preserved flow related signal in more distal right MCA vessels. Posterior circulation bilateral intradural vertebral arteries basilar artery and bilateral posterior cerebral arteries are patent proximally and mic with significant stenosis. MRI CERVICAL SPINE FINDINGS Alignment: Straightening. Vertebrae: Vertebral body heights are maintained. Degenerative/discogenic endplate signal changes at C6-C7. Otherwise, no focal marrow edema to suggest acute fracture or discitis/osteomyelitis. No suspicious bone lesions. Cord: Normal cord signal. Posterior Fossa, vertebral arteries, paraspinal tissues: Visualized vertebral artery flow voids are maintained. Disc levels: C2-C3: No significant disc protrusion, foraminal stenosis, or canal stenosis. C3-C4: Small left paracentral disc protrusion without significant stenosis. C4-C5: Small central disc protrusion without significant stenosis. C5-C6: Inferiorly directed small left paracentral disc protrusion which contacts and flattens the ventral cord without significant stenosis. C6-C7: Mild facet/uncovertebral hypertrophy without significant stenosis. C7-T1: Left greater than right facet and uncovertebral hypertrophy with mild left foraminal stenosis. No significant canal or right foraminal stenosis. IMPRESSION: MRI: 1. Scattered acute right MCA territory infarcts. Mild associated edema without mass effect. 2. Additional age  advanced T2/FLAIR hyperintensities within the white matter, which could represent accelerated chronic microvascular disease (thought more likely) or demyelination. MRA: Occlusion versus severe stenosis of the distal right M1 MCA with some preserved flow related signal in more distal right MCA vessels. MRI cervical spine: Relatively mild multilevel degenerative change without evidence of impingement. Findings discussed with Dr. Matilde Sprang via telephone at 12:28 PM. Electronically Signed   By: Margaretha Sheffield M.D.   On: 07/06/2022 12:29   MR Cervical Spine Wo Contrast  Result Date: 07/06/2022 CLINICAL DATA:  Cervical radiculopathy, no red flags; Neuro deficit, acute, stroke suspected EXAM: MRI HEAD WITHOUT CONTRAST MRI CERVICAL SPINE WITHOUT CONTRAST MRA HEAD WITHOUT CONTRAST TECHNIQUE: Multiplanar, multiecho pulse sequences of the brain and surrounding structures, and cervical spine, to include the craniocervical junction and cervicothoracic junction, were obtained without intravenous contrast. Angiographic images of the Circle of Willis were acquired using MRA technique without intravenous contrast. COMPARISON:  CT HEAD 06/25/2022 FINDINGS: MRI HEAD FINDINGS Brain: Scattered acute infarcts in the right frontal and  parietal lobes, predominant in the white matter. Mild associated edema without mass effect. No midline shift. No acute hemorrhage or mass lesion. No hydrocephalus. Vascular: See below. Skull and upper cervical spine: Normal marrow signal. Sinuses/Orbits: Clear sinuses.  No acute orbital findings. Other: No mastoid effusions. MRA HEAD FINDINGS Anterior circulation: Bilateral intracranial ICAs bilateral ACAs, and the left MCA are patent without proximal high-grade stenosis. Occlusion versus severe stenosis of the distal right M1 MCA with some preserved flow related signal in more distal right MCA vessels. Posterior circulation bilateral intradural vertebral arteries basilar artery and bilateral posterior  cerebral arteries are patent proximally and mic with significant stenosis. MRI CERVICAL SPINE FINDINGS Alignment: Straightening. Vertebrae: Vertebral body heights are maintained. Degenerative/discogenic endplate signal changes at C6-C7. Otherwise, no focal marrow edema to suggest acute fracture or discitis/osteomyelitis. No suspicious bone lesions. Cord: Normal cord signal. Posterior Fossa, vertebral arteries, paraspinal tissues: Visualized vertebral artery flow voids are maintained. Disc levels: C2-C3: No significant disc protrusion, foraminal stenosis, or canal stenosis. C3-C4: Small left paracentral disc protrusion without significant stenosis. C4-C5: Small central disc protrusion without significant stenosis. C5-C6: Inferiorly directed small left paracentral disc protrusion which contacts and flattens the ventral cord without significant stenosis. C6-C7: Mild facet/uncovertebral hypertrophy without significant stenosis. C7-T1: Left greater than right facet and uncovertebral hypertrophy with mild left foraminal stenosis. No significant canal or right foraminal stenosis. IMPRESSION: MRI: 1. Scattered acute right MCA territory infarcts. Mild associated edema without mass effect. 2. Additional age advanced T2/FLAIR hyperintensities within the white matter, which could represent accelerated chronic microvascular disease (thought more likely) or demyelination. MRA: Occlusion versus severe stenosis of the distal right M1 MCA with some preserved flow related signal in more distal right MCA vessels. MRI cervical spine: Relatively mild multilevel degenerative change without evidence of impingement. Findings discussed with Dr. Matilde Sprang via telephone at 12:28 PM. Electronically Signed   By: Margaretha Sheffield M.D.   On: 07/06/2022 12:29   MR ANGIO HEAD WO CONTRAST  Result Date: 07/06/2022 CLINICAL DATA:  Cervical radiculopathy, no red flags; Neuro deficit, acute, stroke suspected EXAM: MRI HEAD WITHOUT CONTRAST MRI  CERVICAL SPINE WITHOUT CONTRAST MRA HEAD WITHOUT CONTRAST TECHNIQUE: Multiplanar, multiecho pulse sequences of the brain and surrounding structures, and cervical spine, to include the craniocervical junction and cervicothoracic junction, were obtained without intravenous contrast. Angiographic images of the Circle of Willis were acquired using MRA technique without intravenous contrast. COMPARISON:  CT HEAD 06/25/2022 FINDINGS: MRI HEAD FINDINGS Brain: Scattered acute infarcts in the right frontal and parietal lobes, predominant in the white matter. Mild associated edema without mass effect. No midline shift. No acute hemorrhage or mass lesion. No hydrocephalus. Vascular: See below. Skull and upper cervical spine: Normal marrow signal. Sinuses/Orbits: Clear sinuses.  No acute orbital findings. Other: No mastoid effusions. MRA HEAD FINDINGS Anterior circulation: Bilateral intracranial ICAs bilateral ACAs, and the left MCA are patent without proximal high-grade stenosis. Occlusion versus severe stenosis of the distal right M1 MCA with some preserved flow related signal in more distal right MCA vessels. Posterior circulation bilateral intradural vertebral arteries basilar artery and bilateral posterior cerebral arteries are patent proximally and mic with significant stenosis. MRI CERVICAL SPINE FINDINGS Alignment: Straightening. Vertebrae: Vertebral body heights are maintained. Degenerative/discogenic endplate signal changes at C6-C7. Otherwise, no focal marrow edema to suggest acute fracture or discitis/osteomyelitis. No suspicious bone lesions. Cord: Normal cord signal. Posterior Fossa, vertebral arteries, paraspinal tissues: Visualized vertebral artery flow voids are maintained. Disc levels: C2-C3: No significant disc protrusion, foraminal stenosis,  or canal stenosis. C3-C4: Small left paracentral disc protrusion without significant stenosis. C4-C5: Small central disc protrusion without significant stenosis. C5-C6:  Inferiorly directed small left paracentral disc protrusion which contacts and flattens the ventral cord without significant stenosis. C6-C7: Mild facet/uncovertebral hypertrophy without significant stenosis. C7-T1: Left greater than right facet and uncovertebral hypertrophy with mild left foraminal stenosis. No significant canal or right foraminal stenosis. IMPRESSION: MRI: 1. Scattered acute right MCA territory infarcts. Mild associated edema without mass effect. 2. Additional age advanced T2/FLAIR hyperintensities within the white matter, which could represent accelerated chronic microvascular disease (thought more likely) or demyelination. MRA: Occlusion versus severe stenosis of the distal right M1 MCA with some preserved flow related signal in more distal right MCA vessels. MRI cervical spine: Relatively mild multilevel degenerative change without evidence of impingement. Findings discussed with Dr. Matilde Sprang via telephone at 12:28 PM. Electronically Signed   By: Margaretha Sheffield M.D.   On: 07/06/2022 12:29     Assessment/Plan Principal Problem:   CVA (cerebral vascular accident) [facility]) Active Problems:   Diabetes mellitus type 2 in obese ([place])   HTN (hypertension)   HLD (hyperlipidemia)   Cardiomyopathy ([place])    Scattered right MCA territory acute CVA Appreciate neurology recommendations Lipid panel, A1c 2D echocardiogram PT/OT evaluation Maintain on telemetry Start aspirin and continue statin  Hypertension uncontrolled Likely related to above Maintain on home blood pressure agents IV hydralazine as needed for blood pressure control  Mild troponin elevation likely secondary to above Note chest pain or concern for MI at this time  Type 2 diabetes Hold home agents SSI Carb modified diet  Dyslipidemia Continue statin  Chronic gout Continue allopurinol  Congenital cardiomyopathy Follows up at [facility]  Morbid obesity BMI 51.54   DVT prophylaxis:  Lovenox Code Status: Full Family Communication: None at bedside Disposition Plan:Admit for CVA evaluation Consults called:Neurology Admission status: Obs, Tele  Severity of Illness: The appropriate patient status for this patient is OBSERVATION. Observation status is judged to be reasonable and necessary in order to provide the required intensity of service to ensure the patient's safety. The patient's presenting symptoms, physical exam findings, and initial radiographic and laboratory data in the context of their medical condition is felt to place them at decreased risk for further clinical deterioration. Furthermore, it is anticipated that the patient will be medically stable for discharge from the hospital within 2 midnights of admission.    Wyndell Cardiff D Manuella Ghazi DO [facility]  If 7PM-7AM, please contact night-coverage [email or web address]  07/06/2022, 2:25 PM

## 2022-07-06 NOTE — ED Notes (Signed)
Went to pt's room to get vital signs for report/admission. Pt states that he fell, states that he was sleeping and rolled out of bed, pt has swelling to L orbit.  Pt awake and oriented, pt pupils are 41mm and reactive to light, pt sitting up in bed, pt continues to have L arm weakness.  MD paged, charge rn notified

## 2022-07-06 NOTE — Progress Notes (Signed)
*  PRELIMINARY RESULTS* Echocardiogram 2D Echocardiogram has been performed with [facility].  Samuel Germany 07/06/2022, 4:20 PM

## 2022-07-06 NOTE — ED Notes (Signed)
Hospitalist paged to report fall

## 2022-07-06 NOTE — H&P (View-Only) (Signed)
*  PRELIMINARY RESULTS* Echocardiogram 2D Echocardiogram has been performed with [facility].  Derrika Ruffalo J 07/06/2022, 4:20 PM 

## 2022-07-06 NOTE — Consult Note (Signed)
I connected with  Ross Schmidt on 07/06/22 by a video enabled telemedicine application and verified that I am speaking with the correct person using two identifiers.   I discussed the limitations of evaluation and management by telemedicine. The patient expressed understanding and agreed to proceed.  Location of patient: [place] Location of physician: [facility]  Neurology Consultation Reason for Consult: stroke Referring Physician: Dr Glendora Score  CC: left arm numbness and weakness  History is obtained from: patient, chart review   HPI: Ross Schmidt is a 36 y.o. male  with PMH of CHF, COPD, HTN, OSA, DM who presented with left arm weakness and numbness since 06/19/2022. States he was sitting in his truck and suddenly noticed his right arm felt tingly. It didn't improve so he eventually wen to [facility] on 06/25/2022. CTH didn't show any acute abnormality. He was diagnosed with possible stroke and discharged with recommendations for outpatient MRI. Today morning, he went to his PCP and his systolic BP was elevated at 184. Given recent stroke and elevated BP now, he was sent to ER for further workup.   Of note, patient denies any left leg, face weakness to me. However, per ED triage note left side of his face and lip went numb about 5-6 days ago which has since resolved. Denies ever having similar symptoms in past. Not on any AP/AC.   LKN: 06/19/2022 No tpa as outside window No thrombectomy as outside window Event happened outside ( in truck) mRS 0  ROS: All other systems reviewed and negative except as noted in the HPI.   Past Medical History:  Diagnosis Date   Asthma    CHF (congestive heart failure) (HCC)    COPD (chronic obstructive pulmonary disease) (HCC)    CVA (cerebral vascular accident) (HCC) 07/06/2022   Diabetes mellitus without complication (HCC)    HLD (hyperlipidemia) 08/11/2016   Hypertension    Obesity    Oxygen deficiency    Premature  baby    Sleep apnea     Family History  Problem Relation Age of Onset   Diabetes Maternal Uncle    Heart disease Maternal Uncle    Diabetes Maternal Grandmother    Hypertension Maternal Grandmother    Miscarriages / Stillbirths Maternal Grandfather     Social History:  reports that he quit smoking about 6 years ago. His smoking use included cigarettes. He started smoking about 24 years ago. He smoked an average of .25 packs per day. He has never used smokeless tobacco. He reports that he does not drink alcohol and does not use drugs.   Exam: Current vital signs: BP (!) 161/107   Pulse 88   Temp 98.6 F (37 C) (Oral)   Resp (!) 21   Ht 5\' 8"  (1.727 m)   Wt (!) 153.8 kg   SpO2 94%   BMI 51.54 kg/m  Vital signs in last 24 hours: Temp:  [98.6 F (37 C)-99 F (37.2 C)] 98.6 F (37 C) (09/25 1217) Pulse Rate:  [79-97] 88 (09/25 1345) Resp:  [17-21] 21 (09/25 1345) BP: (151-205)/(107-149) 161/107 (09/25 1345) SpO2:  [93 %-97 %] 94 % (09/25 1345) Weight:  [153.8 kg] 153.8 kg (09/25 1018)   Physical Exam  Constitutional: Appears well-developed and well-nourished.  Psych: Affect appropriate to situation Eyes: No scleral injection Neuro: Aox3, no aphasia, CN 2-12 grossly intact, antigravity strength in all extremities with left upper extremity drift ( doesn't drift to bed), decreased sensation in left arm,  FTN intact BL   NIHSS 1 ( decreased sensation in left)  I have reviewed labs in epic and the results pertinent to this consultation are: CBC:  Recent Labs  Lab 07/06/22 1048  WBC 7.7  NEUTROABS 4.7  HGB 14.4  HCT 47.7  MCV 90.5  PLT 678    Basic Metabolic Panel:  Lab Results  Component Value Date   NA 140 07/06/2022   K 4.1 07/06/2022   CO2 31 07/06/2022   GLUCOSE 169 (H) 07/06/2022   BUN 12 07/06/2022   CREATININE 1.12 07/06/2022   CALCIUM 8.6 (L) 07/06/2022   GFRNONAA >60 07/06/2022   GFRAA >60 06/01/2017   Lipid Panel:  Lab Results  Component  Value Date   LDLCALC 87 12/15/2016   HgbA1c:  Lab Results  Component Value Date   HGBA1C 7.3 (H) 12/15/2016   Urine Drug Screen: No results found for: "LABOPIA", "COCAINSCRNUR", "LABBENZ", "AMPHETMU", "THCU", "LABBARB"  Alcohol Level No results found for: "ETH"   I have reviewed the images obtained:  MR Brain wo contrast 07/06/2022:1. Scattered acute right MCA territory infarcts. Mild associated edema without mass effect. 2. Additional age advanced T2/FLAIR hyperintensities within the white matter, which could represent accelerated chronic microvascular disease (thought more likely) or demyelination.  MRA head wo contrast 07/06/2022: Occlusion versus severe stenosis of the distal right M1 MCA with some preserved flow related signal in more distal right MCA vessels.  MR C spine wo contrast 07/06/2022: Relatively mild multilevel degenerative change without evidence of impingement.     ASSESSMENT/PLAN: 36yo M with multiple medical commodities as noted in HPI who presented with left arm weakness and numbness.   Acute ischemic stroke Cerebral edema HTN HLD OSA CHF Morbid obesity - Etiology of stroke: watershed infarct due to hypoperfusion due to M1 stenosis/occlusion  Recommendations: - The stenosis/occlusion of right M1 is distal and stroke happened weeks ago. Therefore, patient is not a candidate for neuro-intervention. Will discuss with our neuro-intervention team in case he needs diagnostic angio or outpatient f/u  - Recommend asa 325mg  once followed by 81mg  daily - Also recommend plavix 75mg  daily for 3 months - Will start atorvastatin 40mg  daily - Lipid panel and a1c ordered and pending - Recommend goal BP: Normotension. Avoid hypotension. - If there is recurrence or worsening of symptoms, please check BP and lower head. If symptoms persist, recommend stat [facility] - stroke education including BEFAST - PT/OT/SLP - f/u with neuro in 3 months  Thank you for allowing [place] to  participate in the care of this patient. If you have any further questions, please contact  me or neurohospitalist.   Zeb Comfort Epilepsy [facility] neurohospitalist

## 2022-07-06 NOTE — ED Provider Notes (Signed)
[facility] EMERGENCY DEPARTMENT Provider Note  CSN: [id number] Arrival date & time: 07/06/22 0915  Chief Complaint(s) No chief complaint on file.  HPI Ross Schmidt is a 36 y.o. male with PMH CHF, COPD, T2DM, HLD, HTN, obesity who presents emergency department for evaluation of left upper extremity numbness and weakness.  Patient was reportedly seen at an outside emergency department 11 days ago where he received CT imaging that was reassuringly negative and ultimately was discharged home with instructions to try and set up an outpatient MRI.  He was unable to obtain this scan but did see his primary care physician today who noticed worsening left upper extremity weakness and sent the patient back to the emergency department for stroke work-up.  There is also concern due to the patient's blood pressures being significant elevated with systolics greater than 160.  He denies chest pain, shortness of breath, abdominal pain, nausea, vomiting or other systemic symptoms.   Past Medical History Past Medical History:  Diagnosis Date   Asthma    CHF (congestive heart failure) ([place])    COPD (chronic obstructive pulmonary disease) ([place])    Diabetes mellitus without complication ([place])    HLD (hyperlipidemia) 08/11/2016   Hypertension    Obesity    Oxygen deficiency    Premature baby    Sleep apnea    Patient Active Problem List   Diagnosis Date Noted   Abnormal nuclear stress test    Cardiomyopathy ([place])    Dyspnea on exertion 10/07/2016   Extremely obese with respiratory disorder ([place]) 08/11/2016   Diabetes mellitus type 2 in obese ([place]) 08/11/2016   Pulmonary HTN ([place]) 08/11/2016   HTN (hypertension) 08/11/2016   HLD (hyperlipidemia) 08/11/2016   Personal history of noncompliance with medical treatment, presenting hazards to health 08/11/2016   OSA (obstructive sleep apnea) 08/11/2016   CHF (congestive heart failure) ([place]) 08/11/2016   Oxygen dependent 08/11/2016   Home  Medication(s) Prior to Admission medications   Medication Sig Start Date End Date Taking? Authorizing Provider  allopurinol (ZYLOPRIM) 300 MG tablet Take 300 mg by mouth daily. 11/26/21   [provider]  amLODipine (NORVASC) 5 MG tablet Take 1 tablet (5 mg total) by mouth daily. 10/10/20 12/02/21  Verta Ellen., NP  atorvastatin (LIPITOR) 40 MG tablet TAKE 1 TABLET BY MOUTH DAILY 11/13/19   Herminio Commons, MD  blood glucose meter kit and supplies KIT Dispense based on patient and insurance preference. Use up to four times daily as directed. (FOR ICD-9 250.00, 250.01). 08/26/16   Raylene Everts, MD  carvedilol (COREG) 25 MG tablet TAKE ONE TABLET BY MOUTH TWICE DAILY WITH FOOD 10/22/17   Herminio Commons, MD  furosemide (LASIX) 40 MG tablet Take 1 tablet (40 mg total) by mouth as needed. 07/01/17   Herminio Commons, MD  hydrALAZINE (APRESOLINE) 100 MG tablet Take 1 tablet (100 mg total) by mouth 3 (three) times daily. 03/13/20   Strader, Fransisco Hertz, PA-C  JARDIANCE 25 MG TABS tablet Take 25 mg by mouth daily. 11/26/21   [provider]  metFORMIN (GLUCOPHAGE) 1000 MG tablet Take 1 tablet (1,000 mg total) by mouth 2 (two) times daily. 06/03/17   Jettie Booze, MD  sacubitril-valsartan (ENTRESTO) 49-51 MG Take 1 tablet by mouth 2 (two) times daily. 12/02/21   Hilty, Nadean Corwin, MD  TRULICITY 1.5 FU/9.3AT SOPN Inject into the skin once a week. 11/26/21   [provider]  Past Surgical History Past Surgical History:  Procedure Laterality Date   LEFT HEART CATH AND CORONARY ANGIOGRAPHY N/A 06/01/2017   Procedure: LEFT HEART CATH AND CORONARY ANGIOGRAPHY;  Surgeon: Jettie Booze, MD;  Location: [place];  Service: Cardiovascular;  Laterality: N/A;   TONSILLECTOMY     Family History Family History  Problem  Relation Age of Onset   Diabetes Maternal Uncle    Heart disease Maternal Uncle    Diabetes Maternal Grandmother    Hypertension Maternal Grandmother    Miscarriages / Stillbirths Maternal Grandfather     Social History Social History   Tobacco Use   Smoking status: Former    Packs/day: 0.25    Types: Cigarettes    Start date: 10/12/1997    Quit date: 02/10/2016    Years since quitting: 6.4   Smokeless tobacco: Never  Vaping Use   Vaping Use: Never used  Substance Use Topics   Alcohol use: No   Drug use: No    Comment: hx of marijuana use, quit recently   Allergies Morphine and related and Nicoderm [nicotine]  Review of Systems Review of Systems  Neurological:  Positive for weakness and numbness.    Physical Exam Vital Signs  I have reviewed the triage vital signs BP (!) 183/120 (BP Location: Right Arm)   Pulse 95   Temp 99 F (37.2 C) (Oral)   Resp 18   Ht _0  (1.727 m)   Wt (!) 153.8 kg   SpO2 93%   BMI 51.54 kg/m   Physical Exam Constitutional:      General: He is not in acute distress.    Appearance: Normal appearance.  HENT:     Head: Normocephalic and atraumatic.     Nose: No congestion or rhinorrhea.  Eyes:     General:        Right eye: No discharge.        Left eye: No discharge.     Extraocular Movements: Extraocular movements intact.     Pupils: Pupils are equal, round, and reactive to light.  Cardiovascular:     Rate and Rhythm: Normal rate and regular rhythm.     Heart sounds: No murmur heard. Pulmonary:     Effort: No respiratory distress.     Breath sounds: No wheezing or rales.  Abdominal:     General: There is no distension.     Tenderness: There is no abdominal tenderness.  Musculoskeletal:        General: Normal range of motion.     Cervical back: Normal range of motion.  Skin:    General: Skin is warm and dry.  Neurological:     General: No focal deficit present.     Mental Status: He is alert.     Sensory: Sensory  deficit present.     Motor: Weakness present.     ED Results and Treatments Labs (all labs ordered are listed, but only abnormal results are displayed) Labs Reviewed - No data to display  Radiology No results found.  Pertinent labs & imaging results that were available during my care of the patient were reviewed by me and considered in my medical decision making (see MDM for details).  Medications Ordered in ED Medications - No data to display                                                                                                                                   Procedures Procedures  (including critical care time)  Medical Decision Making / ED Course   This patient presents to the ED for concern of left upper extremity numbness and weakness, this involves an extensive number of treatment options, and is a complaint that carries with it a high risk of complications and morbidity.  The differential diagnosis includes CVA, cervical radiculopathy, cervical stenosis, peripheral neuropraxia, hypertensive emergency  MDM: Patient seen in the emergency department for evaluation of left upper extremity numbness and weakness, hypertension.  Physical exam is concerning with left upper extremity weakness and numbness.  No cranial nerve deficits.  Laboratory evaluation is largely unremarkable outside of an initial troponin elevation to 51 but delta troponin 52.  MRI/MRA brain showing scattered acute right MCA territory infarcts with additional age advanced T2 flair hyperintensities with occlusion versus severe stenosis of the distal right M1 MCA with collateralization.  MRI C-spine unremarkable.  I initially spoke with the stroke neurologist on-call Dr. Quinn Axe who states that because the stroke is 6 days old and while outside the window, I should speak with the on-call  inpatient neurologist.  I then spoke with the neurologist on-call Dr. Hortense Ramal who will evaluate the patient while inpatient.  Patient then admitted for completion of stroke work-up.  I had a discussion with the admitting hospitalist and we decided to pursue initiation of his home blood pressure medications with an extra dose of hydralazine prior to initiation of a Cardene drip.   Additional history obtained: -Additional history obtained from mother -External records from outside source obtained and reviewed including: Chart review including previous notes, labs, imaging, consultation notes   Lab Tests: -I ordered, reviewed, and interpreted labs.   The pertinent results include:   Labs Reviewed - No data to display     Imaging Studies ordered: I ordered imaging studies including MRI brain and C spine I independently visualized and interpreted imaging. I agree with the radiologist interpretation   Medicines ordered and prescription drug management: No orders of the defined types were placed in this encounter.   -I have reviewed the patients home medicines and have made adjustments as needed  Critical interventions none  Consultations Obtained: I requested consultation with the neurologist on-call,  and discussed lab and imaging findings as well as pertinent plan - they recommend: Medical admission for completion of stroke work-up   Cardiac Monitoring: The patient was maintained on a cardiac monitor.  I personally viewed and interpreted the cardiac monitored which showed  an underlying rhythm of: NSR  Social Determinants of Health:  Factors impacting patients care include: none   Reevaluation: After the interventions noted above, I reevaluated the patient and found that they have :stayed the same  Co morbidities that complicate the patient evaluation  Past Medical History:  Diagnosis Date   Asthma    CHF (congestive heart failure) (HCC)    COPD (chronic obstructive  pulmonary disease) ([place])    Diabetes mellitus without complication ([place])    HLD (hyperlipidemia) 08/11/2016   Hypertension    Obesity    Oxygen deficiency    Premature baby    Sleep apnea       Dispostion: I considered admission for this patient, and due to new strokes, patient require hospital admission     Final Clinical Impression(s) / ED Diagnoses Final diagnoses:  None     _0 @    Teressa Lower, MD 07/06/22 1947

## 2022-07-06 NOTE — ED Triage Notes (Signed)
Pt c/o left arm numbness that started 11 days ago. Pt went to [facility] for evaluation at that time. Pt reports he was discharged that evening and told to follow up with his PCP. Pt reports the numbness is better, but not completely gone. Pt also reports left side of his face and lip went numb about 5-6 days ago, but has now completely resolved. Pt also concerned because his BP has been elevated with SBP 184 at doctor's office this morning.

## 2022-07-06 NOTE — ED Notes (Signed)
Ice pack given

## 2022-07-06 NOTE — ED Notes (Signed)
Stroke cart placed at bedside.

## 2022-07-06 NOTE — ED Notes (Signed)
Pt back from ct, pt awaits transport

## 2022-07-07 ENCOUNTER — Telehealth: Payer: Self-pay | Admitting: *Deleted

## 2022-07-07 DIAGNOSIS — I639 Cerebral infarction, unspecified: Secondary | ICD-10-CM | POA: Diagnosis not present

## 2022-07-07 LAB — LIPID PANEL
Cholesterol: 137 mg/dL (ref 0–200)
HDL: 27 mg/dL — ABNORMAL LOW (ref >40)
LDL Cholesterol: 76 mg/dL (ref 0–99)
Total CHOL/HDL Ratio: 5.1 RATIO
Triglycerides: 169 mg/dL — ABNORMAL HIGH (ref <150)
VLDL: 34 mg/dL (ref 0–40)

## 2022-07-07 LAB — HEMOGLOBIN A1C
Hgb A1c MFr Bld: 8.2 % — ABNORMAL HIGH (ref 4.8–5.6)
Mean Plasma Glucose: 188.64 mg/dL

## 2022-07-07 LAB — GLUCOSE, CAPILLARY: Glucose-Capillary: 182 mg/dL — ABNORMAL HIGH (ref 70–99)

## 2022-07-07 MED ORDER — CLONIDINE HCL 0.1 MG PO TABS: 0.2000 mg | ORAL_TABLET | Freq: Three times a day (TID) | ORAL | Status: DC

## 2022-07-07 MED ORDER — AMLODIPINE BESYLATE 10 MG PO TABS: 10.0000 mg | ORAL_TABLET | Freq: Every day | ORAL | 0 refills | Status: AC

## 2022-07-07 MED ORDER — CLONIDINE HCL 0.2 MG PO TABS: 0.2000 mg | ORAL_TABLET | Freq: Three times a day (TID) | ORAL | 11 refills | Status: AC

## 2022-07-07 MED ORDER — ENOXAPARIN SODIUM 80 MG/0.8ML IJ SOSY: 75.0000 mg | PREFILLED_SYRINGE | INTRAMUSCULAR | Status: DC

## 2022-07-07 MED ORDER — ASPIRIN 81 MG PO CHEW: 81.0000 mg | CHEWABLE_TABLET | Freq: Every day | ORAL | 2 refills | Status: AC

## 2022-07-07 MED ORDER — CLOPIDOGREL BISULFATE 75 MG PO TABS: 75.0000 mg | ORAL_TABLET | Freq: Every day | ORAL | 2 refills | Status: AC

## 2022-07-07 MED ORDER — LOSARTAN POTASSIUM-HCTZ 100-25 MG PO TABS: 1.0000 | ORAL_TABLET | Freq: Every day | ORAL | 1 refills | Status: DC

## 2022-07-07 NOTE — Care Plan (Addendum)
Reviewed carotid US and TTE, no source of stroke.  Discussed with Dr Leonie Man, recommended 30 day event monitor, vasculitis and hypercoag panel. Will also contact cardiology to see if we need TEE.  Patient has been discharged so will call PCP to help with blood workup. DR Manuella Ghazi will order 30 day event monitor.   Ross Schmidt [name]

## 2022-07-07 NOTE — Discharge Summary (Signed)
Physician Discharge Summary  Ross Schmidt YIF:[id number] DOB: Oct 09, 1986 DOA: 07/06/2022  PCP: Carrolyn Meiers, MD  Admit date: 07/06/2022  Discharge date: 07/07/2022  Admitted From:Home  Disposition:  Home  Recommendations for Outpatient Follow-up:  Follow up with PCP in 1-2 weeks and follow-up blood pressure readings with medications adjusted as noted below Follow-up with Dr. Merlene Laughter with neurology in 3 months Continue on aspirin and Plavix for 3 months and then aspirin daily thereafter Continue statin as prior Continue other home medications as prior  Home Health: Outpatient PT  Equipment/Devices: None  Discharge Condition:Stable  CODE STATUS: Full  Diet recommendation: Heart Healthy/carb modified  Brief/Interim Summary: Ross Schmidt is a 36 y.o. male with medical history significant for hypertension, dyslipidemia, cardiomyopathy, and type 2 diabetes who presented to the ED on encouragement by his PCP due to elevated blood pressure readings and persistent left arm weakness, numbness, and tingling.  He was noted to have scattered right MCA territory acute CVA that occurred approximately 12 days prior.  He was seen by neurology with recommendations to remain on aspirin and Plavix for 3 months and then aspirin daily thereafter with recommendation to follow-up with neurology outpatient.  2D echocardiogram as noted below with no acute findings and LDL noted to be 76 with A1c of 8.2%.  He was noted to have elevated blood pressure readings during the course of the stay and his home medications have been adjusted.  He is now in stable condition for discharge with no other acute events noted and PT recommending outpatient PT/OT.  Discharge Diagnoses:  Principal Problem:   CVA (cerebral vascular accident) ([place]) Active Problems:   Diabetes mellitus type 2 in obese ([place])   HTN (hypertension)   HLD (hyperlipidemia)   Cardiomyopathy ([place])  Principal discharge diagnosis:  Scattered right MCA territory acute CVA.  Discharge Instructions  Discharge Instructions     Diet - low sodium heart healthy   Complete by: As directed    Increase activity slowly   Complete by: As directed       Allergies as of 07/07/2022       Reactions   Morphine And Related    Heart beat fast    Nicoderm [nicotine]    Rash from the patch         Medication List     STOP taking these medications    sacubitril-valsartan 49-51 MG Commonly known as: ENTRESTO       TAKE these medications    allopurinol 300 MG tablet Commonly known as: ZYLOPRIM Take 300 mg by mouth daily.   amLODipine 10 MG tablet Commonly known as: NORVASC Take 1 tablet (10 mg total) by mouth daily. Start taking on: July 08, 2022 What changed:  medication strength how much to take   aspirin 81 MG chewable tablet Chew 1 tablet (81 mg total) by mouth daily. Start taking on: July 08, 2022   atorvastatin 40 MG tablet Commonly known as: LIPITOR TAKE 1 TABLET BY MOUTH DAILY   blood glucose meter kit and supplies Kit Dispense based on patient and insurance preference. Use up to four times daily as directed. (FOR ICD-9 250.00, 250.01).   carvedilol 25 MG tablet Commonly known as: COREG TAKE ONE TABLET BY MOUTH TWICE DAILY WITH FOOD   cloNIDine 0.2 MG tablet Commonly known as: CATAPRES Take 1 tablet (0.2 mg total) by mouth 3 (three) times daily. What changed:  medication strength how much to take when to take this   clopidogrel 75  MG tablet Commonly known as: PLAVIX Take 1 tablet (75 mg total) by mouth daily. Start taking on: July 08, 2022   hydrALAZINE 100 MG tablet Commonly known as: APRESOLINE Take 1 tablet (100 mg total) by mouth 3 (three) times daily.   Jardiance 25 MG Tabs tablet Generic drug: empagliflozin Take 25 mg by mouth daily.   losartan-hydrochlorothiazide 100-25 MG tablet Commonly known as: HYZAAR Take 1 tablet by mouth daily.   metFORMIN 1000  MG tablet Commonly known as: GLUCOPHAGE Take 1 tablet (1,000 mg total) by mouth 2 (two) times daily.   Trulicity 1.5 QJ/3.3LK Sopn Generic drug: Dulaglutide Inject into the skin once a week.        Follow-up Information     Fanta, Normajean Baxter, MD. Schedule an appointment as soon as possible for a visit in 1 week(s).   Specialty: Internal Medicine Contact information: [place] [address] [phone]         Phillips Odor, MD. Schedule an appointment as soon as possible for a visit in 3 month(s).   Specialty: Neurology Contact information: [place] [address] [phone]         Pixie Casino, MD. Schedule an appointment as soon as possible for a visit in 1 month(s).   Specialty: Cardiology Contact information: [address] [place] [address] [phone]                Allergies  Allergen Reactions   Morphine And Related     Heart beat fast    Nicoderm [Nicotine]     Rash from the patch     Consultations: Neurology   Procedures/Studies: CT HEAD WO CONTRAST (5MM)  Result Date: 07/06/2022 CLINICAL DATA:  Ruled out of bed hitting head on floor swelling above left eye EXAM: CT HEAD WITHOUT CONTRAST TECHNIQUE: Contiguous axial images were obtained from the base of the skull through the vertex without intravenous contrast. RADIATION DOSE REDUCTION: This exam was performed according to the departmental dose-optimization program which includes automated exposure control, adjustment of the mA and/or kV according to patient size and/or use of iterative reconstruction technique. COMPARISON:  MR head 07/06/2022 and CT head 06/25/2022 FINDINGS: Brain: Focal areas of hypoattenuation within the right frontal and parietal lobes are compatible with acute infarcts which were better evaluated on MRI from earlier today.No intracranial hemorrhage or mass effect. No hydrocephalus. No extra-axial fluid collection.  Generalized cerebral atrophy. Ill-defined hypoattenuation within the cerebral white matter is nonspecific but consistent with chronic small vessel ischemic disease. Vascular: No hyperdense vessel. Intracranial arterial calcification. Skull: No fracture or focal lesion. Soft tissue contusion about the left orbit laterally. Sinuses/Orbits: No acute finding. Paranasal sinuses and mastoid air cells are well aerated. Other: None. IMPRESSION: 1. Scattered areas of acute infarct in the right MCA territory as seen on MRI earlier today. 2. Soft tissue contusion about the left orbit. No underlying fracture. Electronically Signed   By: Placido Sou M.D.   On: 07/06/2022 21:05   ECHOCARDIOGRAM COMPLETE  Result Date: 07/06/2022    ECHOCARDIOGRAM REPORT   Patient Name:   BERKLEY CRONKRIGHT Date of Exam: 07/06/2022 Medical Rec #:  [id number]       Height:       68.0 in Accession #:    [id number]      Weight:       339.0 lb Date of Birth:  Apr 06, 1986       BSA:  2.558 m Patient Age:    36 years        BP:           144/82 mmHg Patient Gender: M               HR:           93 bpm. Exam Location:  [facility] Procedure: 2D Echo, Cardiac Doppler and Color Doppler Indications:    I63.9 Stroke  History:        Patient has prior history of Echocardiogram examinations, most                 recent 11/23/2019. Cardiomyopathy and CHF, Stroke and COPD; Risk                 Factors:Hypertension, Diabetes and Dyslipidemia. Morbid Obesity,                 Sleep apnea (From Hx).  Sonographer:    Alvino Chapel RCS Referring Phys: [id number] [place] D [place]  1. Left ventricular ejection fraction, by estimation, is 50 to 55%. The left ventricle has low normal function. The left ventricle has no regional wall motion abnormalities. There is severe concentric left ventricular hypertrophy. Left ventricular diastolic parameters are consistent with Grade II diastolic dysfunction (pseudonormalization).  2. Right ventricular systolic function  is low normal. The right ventricular size is normal. Tricuspid regurgitation signal is inadequate for assessing PA pressure.  3. Left atrial size was moderately dilated.  4. Right atrial size was mildly dilated.  5. The mitral valve is grossly normal. Trivial mitral valve regurgitation.  6. The aortic valve is tricuspid. Aortic valve regurgitation is not visualized. Aortic valve mean gradient measures 4.0 mmHg.  7. The inferior vena cava is normal in size with greater than 50% respiratory variability, suggesting right atrial pressure of 3 mmHg. Comparison(s): Prior images reviewed side by side. LVEF low normal range at 50-55%. Moderate diastolic dysfunction. FINDINGS  Left Ventricle: Left ventricular ejection fraction, by estimation, is 50 to 55%. The left ventricle has low normal function. The left ventricle has no regional wall motion abnormalities. The left ventricular internal cavity size was normal in size. There is severe concentric left ventricular hypertrophy. Left ventricular diastolic parameters are consistent with Grade II diastolic dysfunction (pseudonormalization). Right Ventricle: The right ventricular size is normal. No increase in right ventricular wall thickness. Right ventricular systolic function is low normal. Tricuspid regurgitation signal is inadequate for assessing PA pressure. Left Atrium: Left atrial size was moderately dilated. Right Atrium: Right atrial size was mildly dilated. Pericardium: There is no evidence of pericardial effusion. Mitral Valve: The mitral valve is grossly normal. Trivial mitral valve regurgitation. Tricuspid Valve: The tricuspid valve is grossly normal. Tricuspid valve regurgitation is trivial. Aortic Valve: The aortic valve is tricuspid. There is mild aortic valve annular calcification. Aortic valve regurgitation is not visualized. Aortic valve mean gradient measures 4.0 mmHg. Aortic valve peak gradient measures 9.2 mmHg. Aortic valve area, by  VTI measures 2.46 cm.  Pulmonic Valve: The pulmonic valve was grossly normal. Pulmonic valve regurgitation is trivial. Aorta: The aortic root is normal in size and structure. Venous: The inferior vena cava is normal in size with greater than 50% respiratory variability, suggesting right atrial pressure of 3 mmHg. IAS/Shunts: No atrial level shunt detected by color flow Doppler.  LEFT VENTRICLE PLAX 2D LVIDd:         4.90 cm   Diastology LVIDs:  2.90 cm   LV e' medial:    6.09 cm/s LV PW:         1.60 cm   LV E/e' medial:  11.7 LV IVS:        1.70 cm   LV e' lateral:   5.44 cm/s LVOT diam:     2.00 cm   LV E/e' lateral: 13.1 LV SV:         56 LV SV Index:   22 LVOT Area:     3.14 cm  RIGHT VENTRICLE RV S prime:     16.80 cm/s TAPSE (M-mode): 2.9 cm LEFT ATRIUM            Index        RIGHT ATRIUM           Index LA diam:      4.80 cm  1.88 cm/m   RA Area:     27.40 cm LA Vol (A2C): 92.3 ml  36.09 ml/m  RA Volume:   94.50 ml  36.95 ml/m LA Vol (A4C): 138.0 ml 53.96 ml/m  AORTIC VALVE AV Area (Vmax):    2.11 cm AV Area (Vmean):   2.43 cm AV Area (VTI):     2.46 cm AV Vmax:           152.00 cm/s AV Vmean:          91.000 cm/s AV VTI:            0.226 m AV Peak Grad:      9.2 mmHg AV Mean Grad:      4.0 mmHg LVOT Vmax:         102.00 cm/s LVOT Vmean:        70.400 cm/s LVOT VTI:          0.177 m LVOT/AV VTI ratio: 0.78  AORTA Ao Root diam: 3.50 cm MITRAL VALVE MV Area (PHT): 3.06 cm    SHUNTS MV Decel Time: 248 msec    Systemic VTI:  0.18 m MV E velocity: 71.15 cm/s  Systemic Diam: 2.00 cm MV A velocity: 56.75 cm/s MV E/A ratio:  1.25 Rozann Lesches MD Electronically signed by Rozann Lesches MD Signature Date/Time: 07/06/2022/5:17:12 PM    Final    US Carotid Bilateral  Result Date: 07/06/2022 CLINICAL DATA:  Left arm numbness for the past 11 days. History of diabetes, hypertension, hyperlipidemia and CAD. Former smoker. EXAM: BILATERAL CAROTID DUPLEX ULTRASOUND TECHNIQUE: Pearline Cables scale imaging, color Doppler and duplex  ultrasound were performed of bilateral carotid and vertebral arteries in the neck. COMPARISON:  None Available. FINDINGS: Criteria: Quantification of carotid stenosis is based on velocity parameters that correlate the residual internal carotid diameter with NASCET-based stenosis levels, using the diameter of the distal internal carotid lumen as the denominator for stenosis measurement. The following velocity measurements were obtained: RIGHT ICA: 65/22 cm/sec CCA: 920/10 cm/sec SYSTOLIC ICA/CCA RATIO:  0.6 ECA: 114 cm/sec LEFT ICA: 78/25 cm/sec CCA: 071/21 cm/sec SYSTOLIC ICA/CCA RATIO:  0.7 ECA: 97 cm/sec RIGHT CAROTID ARTERY: There is no grayscale evidence of significant intimal thickening or atherosclerotic plaque affecting the interrogated portions of the right carotid system. There are no elevated peak systolic velocities within the interrogated course of the right internal carotid artery to suggest a hemodynamically significant stenosis. RIGHT VERTEBRAL ARTERY:  Antegrade flow LEFT CAROTID ARTERY: There is a minimal amount of intimal thickening/hypoechoic plaque involving the left carotid bulb, extending to involve the origin and proximal aspects of the left internal carotid  artery (image 59), not resulting in elevated peak systolic velocities within the interrogated course of the left internal carotid artery to suggest a hemodynamically significant stenosis. LEFT VERTEBRAL ARTERY:  Antegrade flow IMPRESSION: 1. Minimal amount of left-sided intimal thickening/atherosclerotic plaque, not resulting in a hemodynamically significant stenosis. 2. Unremarkable sonographic evaluation of the right carotid system. Electronically Signed   By: Sandi Mariscal M.D.   On: 07/06/2022 16:49   MR BRAIN WO CONTRAST  Result Date: 07/06/2022 CLINICAL DATA:  Cervical radiculopathy, no red flags; Neuro deficit, acute, stroke suspected EXAM: MRI HEAD WITHOUT CONTRAST MRI CERVICAL SPINE WITHOUT CONTRAST MRA HEAD WITHOUT CONTRAST  TECHNIQUE: Multiplanar, multiecho pulse sequences of the brain and surrounding structures, and cervical spine, to include the craniocervical junction and cervicothoracic junction, were obtained without intravenous contrast. Angiographic images of the Circle of Willis were acquired using MRA technique without intravenous contrast. COMPARISON:  CT HEAD 06/25/2022 FINDINGS: MRI HEAD FINDINGS Brain: Scattered acute infarcts in the right frontal and parietal lobes, predominant in the white matter. Mild associated edema without mass effect. No midline shift. No acute hemorrhage or mass lesion. No hydrocephalus. Vascular: See below. Skull and upper cervical spine: Normal marrow signal. Sinuses/Orbits: Clear sinuses.  No acute orbital findings. Other: No mastoid effusions. MRA HEAD FINDINGS Anterior circulation: Bilateral intracranial ICAs bilateral ACAs, and the left MCA are patent without proximal high-grade stenosis. Occlusion versus severe stenosis of the distal right M1 MCA with some preserved flow related signal in more distal right MCA vessels. Posterior circulation bilateral intradural vertebral arteries basilar artery and bilateral posterior cerebral arteries are patent proximally and mic with significant stenosis. MRI CERVICAL SPINE FINDINGS Alignment: Straightening. Vertebrae: Vertebral body heights are maintained. Degenerative/discogenic endplate signal changes at C6-C7. Otherwise, no focal marrow edema to suggest acute fracture or discitis/osteomyelitis. No suspicious bone lesions. Cord: Normal cord signal. Posterior Fossa, vertebral arteries, paraspinal tissues: Visualized vertebral artery flow voids are maintained. Disc levels: C2-C3: No significant disc protrusion, foraminal stenosis, or canal stenosis. C3-C4: Small left paracentral disc protrusion without significant stenosis. C4-C5: Small central disc protrusion without significant stenosis. C5-C6: Inferiorly directed small left paracentral disc protrusion  which contacts and flattens the ventral cord without significant stenosis. C6-C7: Mild facet/uncovertebral hypertrophy without significant stenosis. C7-T1: Left greater than right facet and uncovertebral hypertrophy with mild left foraminal stenosis. No significant canal or right foraminal stenosis. IMPRESSION: MRI: 1. Scattered acute right MCA territory infarcts. Mild associated edema without mass effect. 2. Additional age advanced T2/FLAIR hyperintensities within the white matter, which could represent accelerated chronic microvascular disease (thought more likely) or demyelination. MRA: Occlusion versus severe stenosis of the distal right M1 MCA with some preserved flow related signal in more distal right MCA vessels. MRI cervical spine: Relatively mild multilevel degenerative change without evidence of impingement. Findings discussed with Dr. Matilde Sprang via telephone at 12:28 PM. Electronically Signed   By: Margaretha Sheffield M.D.   On: 07/06/2022 12:29   MR Cervical Spine Wo Contrast  Result Date: 07/06/2022 CLINICAL DATA:  Cervical radiculopathy, no red flags; Neuro deficit, acute, stroke suspected EXAM: MRI HEAD WITHOUT CONTRAST MRI CERVICAL SPINE WITHOUT CONTRAST MRA HEAD WITHOUT CONTRAST TECHNIQUE: Multiplanar, multiecho pulse sequences of the brain and surrounding structures, and cervical spine, to include the craniocervical junction and cervicothoracic junction, were obtained without intravenous contrast. Angiographic images of the Circle of Willis were acquired using MRA technique without intravenous contrast. COMPARISON:  CT HEAD 06/25/2022 FINDINGS: MRI HEAD FINDINGS Brain: Scattered acute infarcts in the right frontal and parietal  lobes, predominant in the white matter. Mild associated edema without mass effect. No midline shift. No acute hemorrhage or mass lesion. No hydrocephalus. Vascular: See below. Skull and upper cervical spine: Normal marrow signal. Sinuses/Orbits: Clear sinuses.  No acute  orbital findings. Other: No mastoid effusions. MRA HEAD FINDINGS Anterior circulation: Bilateral intracranial ICAs bilateral ACAs, and the left MCA are patent without proximal high-grade stenosis. Occlusion versus severe stenosis of the distal right M1 MCA with some preserved flow related signal in more distal right MCA vessels. Posterior circulation bilateral intradural vertebral arteries basilar artery and bilateral posterior cerebral arteries are patent proximally and mic with significant stenosis. MRI CERVICAL SPINE FINDINGS Alignment: Straightening. Vertebrae: Vertebral body heights are maintained. Degenerative/discogenic endplate signal changes at C6-C7. Otherwise, no focal marrow edema to suggest acute fracture or discitis/osteomyelitis. No suspicious bone lesions. Cord: Normal cord signal. Posterior Fossa, vertebral arteries, paraspinal tissues: Visualized vertebral artery flow voids are maintained. Disc levels: C2-C3: No significant disc protrusion, foraminal stenosis, or canal stenosis. C3-C4: Small left paracentral disc protrusion without significant stenosis. C4-C5: Small central disc protrusion without significant stenosis. C5-C6: Inferiorly directed small left paracentral disc protrusion which contacts and flattens the ventral cord without significant stenosis. C6-C7: Mild facet/uncovertebral hypertrophy without significant stenosis. C7-T1: Left greater than right facet and uncovertebral hypertrophy with mild left foraminal stenosis. No significant canal or right foraminal stenosis. IMPRESSION: MRI: 1. Scattered acute right MCA territory infarcts. Mild associated edema without mass effect. 2. Additional age advanced T2/FLAIR hyperintensities within the white matter, which could represent accelerated chronic microvascular disease (thought more likely) or demyelination. MRA: Occlusion versus severe stenosis of the distal right M1 MCA with some preserved flow related signal in more distal right MCA  vessels. MRI cervical spine: Relatively mild multilevel degenerative change without evidence of impingement. Findings discussed with Dr. Matilde Sprang via telephone at 12:28 PM. Electronically Signed   By: Margaretha Sheffield M.D.   On: 07/06/2022 12:29   MR ANGIO HEAD WO CONTRAST  Result Date: 07/06/2022 CLINICAL DATA:  Cervical radiculopathy, no red flags; Neuro deficit, acute, stroke suspected EXAM: MRI HEAD WITHOUT CONTRAST MRI CERVICAL SPINE WITHOUT CONTRAST MRA HEAD WITHOUT CONTRAST TECHNIQUE: Multiplanar, multiecho pulse sequences of the brain and surrounding structures, and cervical spine, to include the craniocervical junction and cervicothoracic junction, were obtained without intravenous contrast. Angiographic images of the Circle of Willis were acquired using MRA technique without intravenous contrast. COMPARISON:  CT HEAD 06/25/2022 FINDINGS: MRI HEAD FINDINGS Brain: Scattered acute infarcts in the right frontal and parietal lobes, predominant in the white matter. Mild associated edema without mass effect. No midline shift. No acute hemorrhage or mass lesion. No hydrocephalus. Vascular: See below. Skull and upper cervical spine: Normal marrow signal. Sinuses/Orbits: Clear sinuses.  No acute orbital findings. Other: No mastoid effusions. MRA HEAD FINDINGS Anterior circulation: Bilateral intracranial ICAs bilateral ACAs, and the left MCA are patent without proximal high-grade stenosis. Occlusion versus severe stenosis of the distal right M1 MCA with some preserved flow related signal in more distal right MCA vessels. Posterior circulation bilateral intradural vertebral arteries basilar artery and bilateral posterior cerebral arteries are patent proximally and mic with significant stenosis. MRI CERVICAL SPINE FINDINGS Alignment: Straightening. Vertebrae: Vertebral body heights are maintained. Degenerative/discogenic endplate signal changes at C6-C7. Otherwise, no focal marrow edema to suggest acute fracture or  discitis/osteomyelitis. No suspicious bone lesions. Cord: Normal cord signal. Posterior Fossa, vertebral arteries, paraspinal tissues: Visualized vertebral artery flow voids are maintained. Disc levels: C2-C3: No significant disc protrusion, foraminal stenosis,  or canal stenosis. C3-C4: Small left paracentral disc protrusion without significant stenosis. C4-C5: Small central disc protrusion without significant stenosis. C5-C6: Inferiorly directed small left paracentral disc protrusion which contacts and flattens the ventral cord without significant stenosis. C6-C7: Mild facet/uncovertebral hypertrophy without significant stenosis. C7-T1: Left greater than right facet and uncovertebral hypertrophy with mild left foraminal stenosis. No significant canal or right foraminal stenosis. IMPRESSION: MRI: 1. Scattered acute right MCA territory infarcts. Mild associated edema without mass effect. 2. Additional age advanced T2/FLAIR hyperintensities within the white matter, which could represent accelerated chronic microvascular disease (thought more likely) or demyelination. MRA: Occlusion versus severe stenosis of the distal right M1 MCA with some preserved flow related signal in more distal right MCA vessels. MRI cervical spine: Relatively mild multilevel degenerative change without evidence of impingement. Findings discussed with Dr. Matilde Sprang via telephone at 12:28 PM. Electronically Signed   By: Margaretha Sheffield M.D.   On: 07/06/2022 12:29     Discharge Exam: Vitals:   07/07/22 0148 07/07/22 0552  BP: (!) 150/114 (!) 143/107  Pulse: 80 84  Resp: 20 15  Temp: 98.2 F (36.8 C) 98.5 F (36.9 C)  SpO2: 98% 98%   Vitals:   07/06/22 2100 07/06/22 2124 07/07/22 0148 07/07/22 0552  BP: (!) 166/98 (!) 161/101 (!) 150/114 (!) 143/107  Pulse:  92 80 84  Resp:  _0 Temp:  98.7 F (37.1 C) 98.2 F (36.8 C) 98.5 F (36.9 C)  TempSrc:   Oral Oral  SpO2:  96% 98% 98%  Weight:      Height:        General:  Pt is alert, awake, not in acute distress, obese Cardiovascular: RRR, S1/S2 +, no rubs, no gallops Respiratory: CTA bilaterally, no wheezing, no rhonchi Abdominal: Soft, NT, ND, bowel sounds + Extremities: no edema, no cyanosis    The results of significant diagnostics from this hospitalization (including imaging, microbiology, ancillary and laboratory) are listed below for reference.     Microbiology: No results found for this or any previous visit (from the past 240 hour(s)).   Labs: BNP (last 3 results) No results for input(s): "BNP" in the last 8760 hours. Basic Metabolic Panel: Recent Labs  Lab 07/06/22 1048  NA 140  K 4.1  CL 103  CO2 31  GLUCOSE 169*  BUN 12  CREATININE 1.12  CALCIUM 8.6*   Liver Function Tests: Recent Labs  Lab 07/06/22 1048  AST 19  ALT 20  ALKPHOS 69  BILITOT 0.9  PROT 7.7  ALBUMIN 3.9   No results for input(s): "LIPASE", "AMYLASE" in the last 168 hours. No results for input(s): "AMMONIA" in the last 168 hours. CBC: Recent Labs  Lab 07/06/22 1048  WBC 7.7  NEUTROABS 4.7  HGB 14.4  HCT 47.7  MCV 90.5  PLT 272   Cardiac Enzymes: No results for input(s): "CKTOTAL", "CKMB", "CKMBINDEX", "TROPONINI" in the last 168 hours. BNP: Invalid input(s): "POCBNP" CBG: Recent Labs  Lab 07/06/22 1717 07/06/22 2134 07/07/22 0821  GLUCAP 170* 231* 182*   D-Dimer No results for input(s): "DDIMER" in the last 72 hours. Hgb A1c Recent Labs    07/06/22 1429 07/07/22 0407  HGBA1C 8.3* 8.2*   Lipid Profile Recent Labs    07/06/22 1429 07/07/22 0407  CHOL 140 137  HDL 28* 27*  LDLCALC 92 76  TRIG 101 169*  CHOLHDL 5.0 5.1   Thyroid function studies No results for input(s): "TSH", "T4TOTAL", "T3FREE", "THYROIDAB" in the last  72 hours.  Invalid input(s): "FREET3" Anemia work up No results for input(s): "VITAMINB12", "FOLATE", "FERRITIN", "TIBC", "IRON", "RETICCTPCT" in the last 72 hours. Urinalysis No results found for:  "COLORURINE", "APPEARANCEUR", "LABSPEC", "PHURINE", "GLUCOSEU", "HGBUR", "BILIRUBINUR", "KETONESUR", "PROTEINUR", "UROBILINOGEN", "NITRITE", "LEUKOCYTESUR" Sepsis Labs Recent Labs  Lab 07/06/22 1048  WBC 7.7   Microbiology No results found for this or any previous visit (from the past 240 hour(s)).   Time coordinating discharge: 35 minutes  SIGNED:   Rodena Goldmann, DO [facility] 07/07/2022, 9:55 AM  If 7PM-7AM, please contact night-coverage [email or web address]

## 2022-07-07 NOTE — Evaluation (Signed)
Physical Therapy Evaluation Patient Details Name: Ross Schmidt MRN: [id number] DOB: 25-Mar-1986 Today's Date: 07/07/2022  History of Present Illness  Ross Schmidt is a 36 y.o. male with medical history significant for hypertension, dyslipidemia, cardiomyopathy, and type 2 diabetes who presented to the ED on encouragement by his PCP due to elevated blood pressure readings and persistent left arm weakness, numbness, and tingling.  His left arm numbness and weakness started approximately 11 days ago and he went to [facility] ED for evaluation at that time.  He was discharged that evening and told to follow-up with his PCP and was not able to obtain outpatient MRI.  He also had some left face and lip numbness about 5-6 days ago which appears to now have resolved.  He denies any trouble with his speech or swallowing.  He denies any trouble with his gait, no headaches, or vision changes noted.   Clinical Impression  Patient functioning near baseline for functional mobility and gait demonstrating good return for ambulation on level, inclined and declined surfaces without loss of balance.  Plan:  Patient discharged from physical therapy to care of nursing for ambulation daily as tolerated for length of stay.         Recommendations for follow up therapy are one component of a multi-disciplinary discharge planning process, led by the attending physician.  Recommendations may be updated based on patient status, additional functional criteria and insurance authorization.  Follow Up Recommendations No PT follow up      Assistance Recommended at Discharge PRN  Patient can return home with the following  Other (comment) (patient near baseline)    Equipment Recommendations None recommended by PT  Recommendations for Other Services       Functional Status Assessment Patient has not had a recent decline in their functional status     Precautions / Restrictions Precautions Precautions:  Fall Restrictions Weight Bearing Restrictions: No      Mobility  Bed Mobility Overal bed mobility: Independent                  Transfers Overall transfer level: Independent                      Ambulation/Gait Ambulation/Gait assistance: Modified independent (Device/Increase time) Gait Distance (Feet): 200 Feet Assistive device: None Gait Pattern/deviations: WFL(Within Functional Limits) Gait velocity: near normal     General Gait Details: grossly WFL with good return for ambulation on level, inclined and declined surfaces without loss of balance  Stairs            Wheelchair Mobility    Modified Rankin (Stroke Patients Only)       Balance Overall balance assessment: No apparent balance deficits (not formally assessed)                                           Pertinent Vitals/Pain Pain Assessment Pain Assessment: 0-10 Pain Score: 3  Pain Location: L eye area Pain Descriptors / Indicators: Throbbing Pain Intervention(s): Limited activity within patient's tolerance, Monitored during session    Home Living Family/patient expects to be discharged to:: Private residence Living Arrangements: Parent Available Help at Discharge: Family;Available 24 hours/day Type of Home: House Home Access: Stairs to enter Entrance Stairs-Rails: None Entrance Stairs-Number of Steps: 3   Home Layout: One level Home Equipment: None  Prior Function Prior Level of Function : Independent/Modified Independent             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 4-/5 grip. LUE Sensation: WNL LUE Coordination: decreased fine motor;decreased gross motor (poor finger to nose test; labored sequential finger touching.)    Lower Extremity Assessment Lower Extremity Assessment: Overall WFL for tasks assessed    Cervical / Trunk Assessment Cervical / Trunk Assessment: Normal  Communication   Communication: No difficulties  Cognition Arousal/Alertness: Awake/alert Behavior During Therapy: WFL for tasks assessed/performed Overall Cognitive Status: Within Functional Limits for tasks assessed                                          General Comments      Exercises     Assessment/Plan    PT Assessment Patient does not need any further PT services  PT Problem List         PT Treatment Interventions      PT Goals (Current goals can be found in the Care Plan section)  Acute Rehab PT Goals Patient Stated Goal: return home PT Goal Formulation: With patient/family Time For Goal Achievement: 07/07/22 Potential to Achieve Goals: Good    Frequency       Co-evaluation PT/OT/SLP Co-Evaluation/Treatment: Yes Reason for Co-Treatment: To address functional/ADL transfers PT goals addressed during session: Mobility/safety with mobility;Balance OT goals addressed during session: ADL's and self-care       AM-PAC PT "6 Clicks" Mobility  Outcome Measure Help needed turning from your back to your side while in a flat bed without using bedrails?: None Help needed moving from lying on your back to sitting on the side of a flat bed without using bedrails?: None Help needed moving to and from a bed to a chair (including a wheelchair)?: None Help needed standing up from a chair using your arms (e.g., wheelchair or bedside chair)?: None Help needed to walk in hospital room?: None Help needed climbing 3-5 steps with a railing? : None 6 Click Score: 24    End of Session   Activity Tolerance: Patient tolerated  treatment well Patient left: in bed;with call bell/phone within reach;with family/visitor present Nurse Communication: Mobility status PT Visit Diagnosis: Unsteadiness on feet (R26.81);Other abnormalities of gait and mobility (R26.89);Muscle weakness (generalized) (M62.81)    Time: 3790-2409 PT Time Calculation (min) (ACUTE ONLY): 20 min   Charges:   PT Evaluation $PT Eval Moderate Complexity: 1 Mod PT Treatments $Therapeutic Activity: 8-22 mins        11:46 AM, 07/07/22 Lonell Grandchild, MPT Physical Therapist with [facility] 336 [phone] office [phone] mobile phone

## 2022-07-07 NOTE — Telephone Encounter (Signed)
Instant msg from Dr. Manuella Ghazi requesting a 30 day monitor for stroke. Pt enrolled in [employer].

## 2022-07-07 NOTE — TOC Transition Note (Addendum)
Transition of Care [facility]) - CM/SW Discharge Note   Patient Details  Name: Ross Schmidt MRN: [id number] Date of Birth: November 13, 1985  [facility] CM/SW Contact:  Boneta Lucks, RN Phone Number: 07/07/2022, 10:11 AM   Clinical Narrative:   Patient discharging home with his wife. PT is recommending HH or Outpatient PT.  No HH agencies has openings for medicaid today.  Wife wants Out Patient PT in [place]. They will follow up with PCP.   Addendum : order placed and faxed to [facility] rehab   Final next level of care: Home/Self Care Barriers to Discharge: No [place] will accept this patient   Patient Goals and CMS Choice Patient states their goals for this hospitalization and ongoing recovery are:: to get better CMS Medicare.gov Compare Post Acute Care list provided to:: Patient Represenative (must comment) Choice offered to / list presented to : Spouse  Discharge Placement            Home

## 2022-07-07 NOTE — Evaluation (Signed)
Occupational Therapy Evaluation Patient Details Name: Ross Schmidt MRN: [id number] DOB: 1985/10/17 Today's Date: 07/07/2022   History of Present Illness Ross Schmidt is a 36 y.o. male with medical history significant for hypertension, dyslipidemia, cardiomyopathy, and type 2 diabetes who presented to the ED on encouragement by his PCP due to elevated blood pressure readings and persistent left arm weakness, numbness, and tingling.  His left arm numbness and weakness started approximately 11 days ago and he went to [facility] ED for evaluation at that time.  He was discharged that evening and told to follow-up with his PCP and was not able to obtain outpatient MRI.  He also had some left face and lip numbness about 5-6 days ago which appears to now have resolved.  He denies any trouble with his speech or swallowing.  He denies any trouble with his gait, no headaches, or vision changes noted. (per MD)   Clinical Impression   Pt agreeable to OT and PT co-evaluation. Pt independent at baseline living with mother. Today pt demonstrates weakness and poor fine and gross motor coordination in L UE. Pt is able to complete functional ADL tasks like donning socks but with labored effort. Pt able to ambulate in hall independently. Pt verbally educated to weight bear with L UE and complete buttoning and snapping tasks using L UE. Pt is not recommended for further acute OT services and will be discharged to care of nursing staff for remaining length of stay.      Recommendations for follow up therapy are one component of a multi-disciplinary discharge planning process, led by the attending physician.  Recommendations may be updated based on patient status, additional functional criteria and insurance authorization.   Follow Up Recommendations  Home health OT (Family requested home health due to difficulty with transportation if pt is not driving.) OP OT also appropriate if transportation was available.     Assistance Recommended at Discharge PRN        Functional Status Assessment  Patient has had a recent decline in their functional status and demonstrates the ability to make significant improvements in function in a reasonable and predictable amount of time.  Equipment Recommendations  None recommended by OT           Precautions / Restrictions Precautions Precautions: Fall Restrictions Weight Bearing Restrictions: No      Mobility Bed Mobility Overal bed mobility: Independent                  Transfers Overall transfer level: Independent                        Balance Overall balance assessment: Mild deficits observed, not formally tested                                         ADL either performed or assessed with clinical judgement   ADL Overall ADL's : Modified independent                                       General ADL Comments: Pt able to don socks and demonstrate WFL mobility for ADL's. ADL's likely labored at this time with R UE use primarily.     Vision Baseline Vision/History: 0 No visual deficits Ability to  See in Adequate Light: 0 Adequate Patient Visual Report: No change from baseline Vision Assessment?: No apparent visual deficits          Praxis  Difficulty noted motor planning touching nose with L UE.     Pertinent Vitals/Pain Pain Assessment Pain Assessment: 0-10 Pain Score: 3  Pain Location: L eye area Pain Descriptors / Indicators: Throbbing Pain Intervention(s): Monitored during session, Repositioned     Hand Dominance Right   Extremity/Trunk Assessment Upper Extremity Assessment Upper Extremity Assessment: LUE deficits/detail LUE Deficits / Details: 4+/5 shoulder flexion and abduction; 4+/5 elbow flexion; 4/5 elbow extension; 4-/5 wrist extension and flexion; Limited supination with compenation in shoulder to achieve supination. 4-/5 grip. LUE Sensation: WNL LUE Coordination:  decreased fine motor;decreased gross motor (poor finger to nose test; labored sequential finger touching.)   Lower Extremity Assessment Lower Extremity Assessment: Defer to PT evaluation   Cervical / Trunk Assessment Cervical / Trunk Assessment: Normal   Communication Communication Communication: No difficulties   Cognition Arousal/Alertness: Awake/alert Behavior During Therapy: WFL for tasks assessed/performed Overall Cognitive Status: Within Functional Limits for tasks assessed                                                        Home Living Family/patient expects to be discharged to:: Private residence Living Arrangements: Parent Available Help at Discharge: Family;Available 24 hours/day Type of Home: House Home Access: Stairs to enter [employer] of Steps: 3 Entrance Stairs-Rails: None Home Layout: One level     Bathroom Shower/Tub: Teacher, early years/pre: Standard Bathroom Accessibility: Yes How Accessible: Accessible via walker Home Equipment: None          Prior Functioning/Environment Prior Level of Function : Independent/Modified Independent             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                                Co-evaluation PT/OT/SLP Co-Evaluation/Treatment: Yes Reason for Co-Treatment: To address functional/ADL transfers   OT goals addressed during session: ADL's and self-care      AM-PAC OT "6 Clicks" Daily Activity     Outcome Measure Help from another person eating meals?: None Help from another person taking care of personal grooming?: None Help from another person toileting, which includes using toliet, bedpan, or urinal?: None Help from another person bathing (including washing, rinsing, drying)?: None Help from another person to put on and taking off regular upper body clothing?: None Help from another person to put on and taking  off regular lower body clothing?: None 6 Click Score: 24   End of Session    Activity Tolerance: Patient tolerated treatment well Patient left: in bed;with call bell/phone within reach  OT Visit Diagnosis: Hemiplegia and hemiparesis Hemiplegia - Right/Left: Left Hemiplegia - dominant/non-dominant: Non-Dominant Hemiplegia - caused by: Cerebral infarction                Time: 0822-0839 OT Time Calculation (min): 17 min Charges:  OT General Charges $OT Visit: 1 Visit OT Evaluation $OT Eval Low Complexity: 1 Low  Makhari Dovidio OT, MOT  Larey Seat 07/07/2022, 9:39 AM

## 2022-07-10 ENCOUNTER — Ambulatory Visit: Payer: Medicaid Other | Attending: Internal Medicine | Admitting: Internal Medicine

## 2022-07-10 ENCOUNTER — Telehealth: Payer: Self-pay | Admitting: Internal Medicine

## 2022-07-10 ENCOUNTER — Encounter: Payer: Self-pay | Admitting: Internal Medicine

## 2022-07-10 DIAGNOSIS — I639 Cerebral infarction, unspecified: Secondary | ICD-10-CM

## 2022-07-10 DIAGNOSIS — Z01812 Encounter for preprocedural laboratory examination: Secondary | ICD-10-CM

## 2022-07-10 DIAGNOSIS — I10 Essential (primary) hypertension: Secondary | ICD-10-CM | POA: Diagnosis not present

## 2022-07-10 DIAGNOSIS — Z9989 Dependence on other enabling machines and devices: Secondary | ICD-10-CM

## 2022-07-10 DIAGNOSIS — I5022 Chronic systolic (congestive) heart failure: Secondary | ICD-10-CM

## 2022-07-10 DIAGNOSIS — G4733 Obstructive sleep apnea (adult) (pediatric): Secondary | ICD-10-CM | POA: Diagnosis not present

## 2022-07-10 MED ORDER — SACUBITRIL-VALSARTAN 97-103 MG PO TABS: 1.0000 | ORAL_TABLET | Freq: Two times a day (BID) | ORAL | 11 refills | Status: DC

## 2022-07-10 NOTE — Telephone Encounter (Signed)
Attempted to call patient to review AVS/instructions. He requested call back in 20 minutes  Call made to patient 20 minutes later but there was no answer  Called patient again and he is unable to review instructions. He will call back .  The following changes have been made: STOP losartan-hctz, START entresto 97/103mg  BID The patient will need the following labs: non-fasting labs before TEE The patient will need the following test(s): TEE on 10/12 with Dr. Debara Pickett  Message to [facility] to schedule APP visit, preferably in [place], for end of October

## 2022-07-10 NOTE — Progress Notes (Signed)
 Virtual Visit via Video Note   Because of Ross Schmidt's co-morbid illnesses, he is at least at moderate risk for complications without adequate follow up.  This format is felt to be most appropriate for this patient at this time.  All issues noted in this document were discussed and addressed.  A limited physical exam was performed with this format.  Please refer to the patient's chart for his consent to telehealth for [facility].      Date:  07/10/2022   ID:  Ross Schmidt, DOB 12/19/1985, MRN [id number] The patient was identified using 2 identifiers.  Evaluation Performed:  Follow-Up Visit  Patient Location:  [address] [place] [address]  Provider location:   [address], [address] [place], [place] [address]  PCP:  Schmidt, Ross Demissie, MD  Cardiologist:  None Electrophysiologist:  None   Chief Complaint:  Follow-up stroke, heart failure  History of Present Illness:    Ross Schmidt is a 36 y.o. male who presents via audio/video conferencing for a telehealth visit today.  He has a history of chronic systolic heart failure, COPD, diabetes, HLD, HTN, pulmonary hypertension, ischemic cardiomyopathy, malignant hypertension, obesity, sleep apnea  Last encounter with Dr. Koneswaran via telemedicine 10/03/2019.  Chronic exertional dyspnea was stable.  Had some mild leg swelling but had not needed to use Lasix.  He was taking Coreg 25 mg p.o. twice daily and Entresto 49/51 mg p.o. twice daily with hydralazine 100 mg 3 times daily.  He was compliant with BiPAP for sleep apnea.  Last echocardiogram showed LVEF of 30 to 35%.  He was symptomatically stable.  He was instructed to weigh himself daily and if developing bilateral leg edema or increased shortness of breath with weight gain of 3 pounds in 24 hours to take Lasix.  At that point he had not had to use Lasix recently.  A follow-up echocardiogram was ordered.  If LVEF remained severely reduced would  consider EP referral for AICD consideration.   Follow-up echocardiogram on 11/23/2019 showed significant improvement of EF to 60 to 65%.  Moderately increased concentric LVH.  Indeterminate diastolic filling due to EA fusion.  He is here for 1 year follow-up.  He denies any recent acute illnesses or hospitalizations.  His blood pressure is significantly high on arrival today at 176/120.  He has not taken any of his antihypertensive medications nor his Entresto yet because he states he usually does not take his medication until after he eats.  He denies any anginal or exertional symptoms, palpitations or arrhythmias.  Denies any PND, orthopnea, orthostatic symptoms, CVA or TIA like symptoms.  Denies any bleeding issues, claudication-like symptoms, DVT or PE-like symptoms.  Has some occasional lower extremity edema for which he takes Lasix.  States he recently saw his primary care provider and stated his hemoglobin A1c was elevated but otherwise lab work was okay per his statement.  History of morbid obesity.  States he has lost some weight recently.  He has lost around 10 pounds since December of last year.  He has significant sleep apnea and uses BiPAP at home and states she is compliant with therapy.  12/02/2021  Ross Schmidt is seen today in follow-up.  He was previously followed by Ross Quinn, NP after being seen by Dr. Koneswaran.  He has a history as above of a nonischemic cardiomyopathy with EF as low as 30% that normalized by echo in 2021.  He is done fairly well although today   blood pressure was markedly elevated 206/108.  He says he has been compliant with his medicine except for Entresto which she said he ran out of.  He recently had a sleep study which was recommended for him to continue his BiPAP with oxygen therapy although he said he has had difficulty with his equipment and I encouraged him to reach out to the pulmonologist to see if he can get some new equipment.  Weight has been fairly stable  although had come down still remains excessive with a BMI 51 and a weight of 346 pounds.  He denies any swelling, or significant orthopnea.  07/10/2022  Ross Schmidt returns today for follow-up.  He was recently admitted for a 5 days ago with a right MCA stroke.  Apparently the symptoms were mild, but he is noted on video to have significant swelling and hematoma over the left eye.  Apparently he fell out of bed in the hospital.  Work-up was negative for possible cause of stroke and he was placed on aspirin and Plavix for 3 months on top of his aspirin.  His last echo in 2021 showed his EF had recovered up to 60 to 65% on Entresto.  He has been compliant with the medicine although blood pressure was uncontrolled on admission.  His medications were then changed.  His Entresto was stopped and switched to losartan HCTZ.  It appears that he is now also on clonidine and other medications for blood pressure control.  Blood pressure appears to be well controlled recently between 110 and 120 systolic.  That being said he is no longer on a guideline directed medication for heart failure.  A repeat echo during the hospitalization showed his LVEF down to 50 to 55%.  There was severe concentric LVH and grade 2 diastolic dysfunction.  The left atrium was moderately dilated and the right atrium was mildly dilated.  I was contacted by the neurologist to see if we could arrange for a transesophageal echocardiogram.  This visit was also in part to discuss the risks and benefits of that procedure with him.  Prior CV studies:   The following studies were reviewed today:  Chart reviewed, lab work  PMHx:  Past Medical History:  Diagnosis Date   Asthma    CHF (congestive heart failure) (HCC)    COPD (chronic obstructive pulmonary disease) (HCC)    CVA (cerebral vascular accident) (HCC) 07/06/2022   Diabetes mellitus without complication (HCC)    HLD (hyperlipidemia) 08/11/2016   Hypertension    Obesity    Oxygen  deficiency    Premature baby    Sleep apnea     Past Surgical History:  Procedure Laterality Date   LEFT HEART CATH AND CORONARY ANGIOGRAPHY N/A 06/01/2017   Procedure: LEFT HEART CATH AND CORONARY ANGIOGRAPHY;  Surgeon: Varanasi, Jayadeep S, MD;  Location: [facility] INVASIVE CV [facility];  Service: Cardiovascular;  Laterality: N/A;   TONSILLECTOMY      FAMHx:  Family History  Problem Relation Age of Onset   Diabetes Maternal Uncle    Heart disease Maternal Uncle    Diabetes Maternal Grandmother    Hypertension Maternal Grandmother    Miscarriages / Stillbirths Maternal Grandfather     SOCHx:   reports that he quit smoking about 6 years ago. His smoking use included cigarettes. He started smoking about 24 years ago. He smoked an average of .25 packs per day. He has never used smokeless tobacco. He reports that he does not drink alcohol and does   not use drugs.  ALLERGIES:  Allergies  Allergen Reactions   Morphine And Related     Heart beat fast    Nicoderm [Nicotine]     Rash from the patch     MEDS:  Current Meds  Medication Sig   allopurinol (ZYLOPRIM) 300 MG tablet Take 300 mg by mouth daily.   amLODipine (NORVASC) 10 MG tablet Take 1 tablet (10 mg total) by mouth daily.   aspirin 81 MG chewable tablet Chew 1 tablet (81 mg total) by mouth daily.   atorvastatin (LIPITOR) 40 MG tablet TAKE 1 TABLET BY MOUTH DAILY (Patient taking differently: Take 40 mg by mouth daily.)   blood glucose meter kit and supplies KIT Dispense based on patient and insurance preference. Use up to four times daily as directed. (FOR ICD-9 250.00, 250.01).   carvedilol (COREG) 25 MG tablet TAKE ONE TABLET BY MOUTH TWICE DAILY WITH FOOD (Patient taking differently: Take 25 mg by mouth 2 (two) times daily with a meal.)   cloNIDine (CATAPRES) 0.2 MG tablet Take 1 tablet (0.2 mg total) by mouth 3 (three) times daily.   clopidogrel (PLAVIX) 75 MG tablet Take 1 tablet (75 mg total) by mouth daily.   hydrALAZINE  (APRESOLINE) 100 MG tablet Take 1 tablet (100 mg total) by mouth 3 (three) times daily.   JARDIANCE 25 MG TABS tablet Take 25 mg by mouth daily.   metFORMIN (GLUCOPHAGE) 1000 MG tablet Take 1 tablet (1,000 mg total) by mouth 2 (two) times daily.   sacubitril-valsartan (ENTRESTO) 97-103 MG Take 1 tablet by mouth 2 (two) times daily.   TRULICITY 1.5 MG/0.5ML SOPN Inject into the skin once a week.   [DISCONTINUED] losartan-hydrochlorothiazide (HYZAAR) 100-25 MG tablet Take 1 tablet by mouth daily.     ROS: Pertinent items noted in HPI and remainder of comprehensive ROS otherwise negative.  Labs/Other Tests and Data Reviewed:    Recent Labs: 07/06/2022: ALT 20; BUN 12; Creatinine, Ser 1.12; Hemoglobin 14.4; Platelets 272; Potassium 4.1; Sodium 140   Recent Lipid Panel Lab Results  Component Value Date/Time   CHOL 137 07/07/2022 04:07 AM   TRIG 169 (H) 07/07/2022 04:07 AM   HDL 27 (L) 07/07/2022 04:07 AM   CHOLHDL 5.1 07/07/2022 04:07 AM   LDLCALC 76 07/07/2022 04:07 AM    Wt Readings from Last 3 Encounters:  07/06/22 (!) 339 lb (153.8 kg)  12/02/21 (!) 346 lb (156.9 kg)  05/15/21 (!) 347 lb 6.4 oz (157.6 kg)     Exam:    Vital Signs:  There were no vitals taken for this visit.   General appearance: alert, no distress, morbidly obese, and left eyelid is swollen/edematous Neck: Thick Lungs: No visual respiratory difficulty, on nasal cannula oxygen Abdomen: Morbidly obese Extremities: No pitting edema Skin: Skin color, texture, turgor normal. 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  He was hypertensive on admission.  His meds were adjusted but he was taken off of Entresto and switched to losartan/HCTZ for unclear reasons - although the  valsartan dose in Entresto is lower than losartan, it does not mean it is less effective.  This will not give him the same cardiovascular risk reduction related to his heart failure.  He was on moderate dose Entresto and I advised going back to that   but increasing it to the high dose instead of the losartan.  We will plan to continue his other heart failure medicines.  LVEF was on the low normal side at 50 to 55%.  He is also on Jardiance 25 mg daily, Trulicity and metformin with an A1c of 8.2%.  He will stay on aspirin and clopidogrel for at least 3 months.  Continue carvedilol and clonidine as well as hydralazine.  Encourage compliance with CPAP.  We discussed the risks and benefits of a transesophageal echocardiogram and he is agreeable to proceed.  I would be able to perform that in a couple of weeks at [facility].  Shared Decision Making/Informed Consent The risks [esophageal damage, perforation (1:10,000 risk), bleeding, pharyngeal hematoma as well as other potential complications associated with conscious sedation including aspiration, arrhythmia, respiratory failure and death], benefits (treatment guidance and diagnostic support) and alternatives of a transesophageal echocardiogram were discussed in detail with Ross Schmidt and he is willing to proceed.    Patient Risk:   After full review of this patients clinical status, I feel that they are at least moderate risk at this time.  Time:   Today, I have spent 25 minutes with the patient with telehealth technology discussing TEE, heart failure, recent stroke, hypertension management.     Medication Adjustments/Labs and Tests Ordered: Current medicines are reviewed at length with the patient today.  Concerns regarding medicines are outlined above.   Tests Ordered: Orders Placed This Encounter  Procedures   Basic metabolic panel   CBC   TRANSESOPHAGEAL ECHOCARDIOGRAM W/O CARDIOVERSION    Medication Changes: Meds ordered this  encounter  Medications   sacubitril-valsartan (ENTRESTO) 97-103 MG    Sig: Take 1 tablet by mouth 2 (two) times daily.    Dispense:  60 tablet    Refill:  11    STOP losartan-hctz    Disposition:  in 2 month(s)  Aerik Polan C. Danecia Underdown, MD, FACC, FACP  [place]  [facility]  Medical Director of the [facility] Diplomate of the American Board of Clinical Lipidology Attending Cardiologist  Direct Dial: [phone]  Fax: [phone]  Website:  [email or web address]  Kem Parcher C Denisha Hoel, MD  07/10/2022 10:14 AM      

## 2022-07-10 NOTE — H&P (View-Only) (Signed)
Virtual Visit via Video Note   Because of Ross Schmidt's co-morbid illnesses, he is at least at moderate risk for complications without adequate follow up.  This format is felt to be most appropriate for this patient at this time.  All issues noted in this document were discussed and addressed.  A limited physical exam was performed with this format.  Please refer to the patient's chart for his consent to telehealth for [facility].      Date:  07/10/2022   ID:  Ross Schmidt, DOB 08-10-1986, MRN [id number] The patient was identified using 2 identifiers.  Evaluation Performed:  Follow-Up Visit  Patient Location:  [place] [address]  Provider location:   [address], [address] [place], [place] [address]  PCP:  Carrolyn Meiers, MD  Cardiologist:  None Electrophysiologist:  None   Chief Complaint:  Follow-up stroke, heart failure  History of Present Illness:    Ross Schmidt is a 36 y.o. male who presents via audio/video conferencing for a telehealth visit today.  He has a history of chronic systolic heart failure, COPD, diabetes, HLD, HTN, pulmonary hypertension, ischemic cardiomyopathy, malignant hypertension, obesity, sleep apnea  Last encounter with Dr. Bronson Ing via telemedicine 10/03/2019.  Chronic exertional dyspnea was stable.  Had some mild leg swelling but had not needed to use Lasix.  He was taking Coreg 25 mg p.o. twice daily and Entresto 49/51 mg p.o. twice daily with hydralazine 100 mg 3 times daily.  He was compliant with BiPAP for sleep apnea.  Last echocardiogram showed LVEF of 30 to 35%.  He was symptomatically stable.  He was instructed to weigh himself daily and if developing bilateral leg edema or increased shortness of breath with weight gain of 3 pounds in 24 hours to take Lasix.  At that point he had not had to use Lasix recently.  A follow-up echocardiogram was ordered.  If LVEF remained severely reduced would  consider EP referral for AICD consideration.   Follow-up echocardiogram on 11/23/2019 showed significant improvement of EF to 60 to 65%.  Moderately increased concentric LVH.  Indeterminate diastolic filling due to EA fusion.  He is here for 1 year follow-up.  He denies any recent acute illnesses or hospitalizations.  His blood pressure is significantly high on arrival today at 176/120.  He has not taken any of his antihypertensive medications nor his [name] yet because he states he usually does not take his medication until after he eats.  He denies any anginal or exertional symptoms, palpitations or arrhythmias.  Denies any PND, orthopnea, orthostatic symptoms, CVA or TIA like symptoms.  Denies any bleeding issues, claudication-like symptoms, DVT or PE-like symptoms.  Has some occasional lower extremity edema for which he takes Lasix.  States he recently saw his primary care provider and stated his hemoglobin A1c was elevated but otherwise lab work was okay per his statement.  History of morbid obesity.  States he has lost some weight recently.  He has lost around 10 pounds since December of last year.  He has significant sleep apnea and uses BiPAP at home and states she is compliant with therapy.  12/02/2021  Ross Schmidt is seen today in follow-up.  He was previously followed by Levell July, NP after being seen by Dr. Bronson Ing.  He has a history as above of a nonischemic cardiomyopathy with EF as low as 30% that normalized by echo in 2021.  He is done fairly well although today  blood pressure was markedly elevated 206/108.  He says he has been compliant with his medicine except for [facility] which she said he ran out of.  He recently had a sleep study which was recommended for him to continue his BiPAP with oxygen therapy although he said he has had difficulty with his equipment and I encouraged him to reach out to the pulmonologist to see if he can get some new equipment.  Weight has been fairly stable  although had come down still remains excessive with a BMI 51 and a weight of 346 pounds.  He denies any swelling, or significant orthopnea.  07/10/2022  Ross Schmidt returns today for follow-up.  He was recently admitted for a 5 days ago with a right MCA stroke.  Apparently the symptoms were mild, but he is noted on video to have significant swelling and hematoma over the left eye.  Apparently he fell out of bed in the hospital.  Work-up was negative for possible cause of stroke and he was placed on aspirin and Plavix for 3 months on top of his aspirin.  His last echo in 2021 showed his EF had recovered up to 60 to 65% on Entresto.  He has been compliant with the medicine although blood pressure was uncontrolled on admission.  His medications were then changed.  His Entresto was stopped and switched to losartan HCTZ.  It appears that he is now also on clonidine and other medications for blood pressure control.  Blood pressure appears to be well controlled recently between 850 and 277 systolic.  That being said he is no longer on a guideline directed medication for heart failure.  A repeat echo during the hospitalization showed his LVEF down to 50 to 55%.  There was severe concentric LVH and grade 2 diastolic dysfunction.  The left atrium was moderately dilated and the right atrium was mildly dilated.  I was contacted by the neurologist to see if we could arrange for a transesophageal echocardiogram.  This visit was also in part to discuss the risks and benefits of that procedure with him.  Prior CV studies:   The following studies were reviewed today:  Chart reviewed, lab work  PMHx:  Past Medical History:  Diagnosis Date   Asthma    CHF (congestive heart failure) ([place])    COPD (chronic obstructive pulmonary disease) ([place])    CVA (cerebral vascular accident) ([place]) 07/06/2022   Diabetes mellitus without complication ([place])    HLD (hyperlipidemia) 08/11/2016   Hypertension    Obesity    Oxygen  deficiency    Premature baby    Sleep apnea     Past Surgical History:  Procedure Laterality Date   LEFT HEART CATH AND CORONARY ANGIOGRAPHY N/A 06/01/2017   Procedure: LEFT HEART CATH AND CORONARY ANGIOGRAPHY;  Surgeon: Jettie Booze, MD;  Location: [facility];  Service: Cardiovascular;  Laterality: N/A;   TONSILLECTOMY      FAMHx:  Family History  Problem Relation Age of Onset   Diabetes Maternal Uncle    Heart disease Maternal Uncle    Diabetes Maternal Grandmother    Hypertension Maternal Grandmother    Miscarriages / Stillbirths Maternal Grandfather     SOCHx:   reports that he quit smoking about 6 years ago. His smoking use included cigarettes. He started smoking about 24 years ago. He smoked an average of .25 packs per day. He has never used smokeless tobacco. He reports that he does not drink alcohol and does  not use drugs.  ALLERGIES:  Allergies  Allergen Reactions   Morphine And Related     Heart beat fast    Nicoderm [Nicotine]     Rash from the patch     MEDS:  Current Meds  Medication Sig   allopurinol (ZYLOPRIM) 300 MG tablet Take 300 mg by mouth daily.   amLODipine (NORVASC) 10 MG tablet Take 1 tablet (10 mg total) by mouth daily.   aspirin 81 MG chewable tablet Chew 1 tablet (81 mg total) by mouth daily.   atorvastatin (LIPITOR) 40 MG tablet TAKE 1 TABLET BY MOUTH DAILY (Patient taking differently: Take 40 mg by mouth daily.)   blood glucose meter kit and supplies KIT Dispense based on patient and insurance preference. Use up to four times daily as directed. (FOR ICD-9 250.00, 250.01).   carvedilol (COREG) 25 MG tablet TAKE ONE TABLET BY MOUTH TWICE DAILY WITH FOOD (Patient taking differently: Take 25 mg by mouth 2 (two) times daily with a meal.)   cloNIDine (CATAPRES) 0.2 MG tablet Take 1 tablet (0.2 mg total) by mouth 3 (three) times daily.   clopidogrel (PLAVIX) 75 MG tablet Take 1 tablet (75 mg total) by mouth daily.   hydrALAZINE  (APRESOLINE) 100 MG tablet Take 1 tablet (100 mg total) by mouth 3 (three) times daily.   JARDIANCE 25 MG TABS tablet Take 25 mg by mouth daily.   metFORMIN (GLUCOPHAGE) 1000 MG tablet Take 1 tablet (1,000 mg total) by mouth 2 (two) times daily.   sacubitril-valsartan (ENTRESTO) 97-103 MG Take 1 tablet by mouth 2 (two) times daily.   TRULICITY 1.5 JY/7.8GN SOPN Inject into the skin once a week.   [DISCONTINUED] losartan-hydrochlorothiazide (HYZAAR) 100-25 MG tablet Take 1 tablet by mouth daily.     ROS: Pertinent items noted in HPI and remainder of comprehensive ROS otherwise negative.  Labs/Other Tests and Data Reviewed:    Recent Labs: 07/06/2022: ALT 20; BUN 12; Creatinine, Ser 1.12; Hemoglobin 14.4; Platelets 272; Potassium 4.1; Sodium 140   Recent Lipid Panel Lab Results  Component Value Date/Time   CHOL 137 07/07/2022 04:07 AM   TRIG 169 (H) 07/07/2022 04:07 AM   HDL 27 (L) 07/07/2022 04:07 AM   CHOLHDL 5.1 07/07/2022 04:07 AM   LDLCALC 76 07/07/2022 04:07 AM    Wt Readings from Last 3 Encounters:  07/06/22 (!) 339 lb (153.8 kg)  12/02/21 (!) 346 lb (156.9 kg)  05/15/21 (!) 347 lb 6.4 oz (157.6 kg)     Exam:    Vital Signs:  There were no vitals taken for this visit.   General appearance: alert, no distress, morbidly obese, and left eyelid is swollen/edematous Neck: Thick Lungs: No visual respiratory difficulty, on nasal cannula oxygen Abdomen: Morbidly obese Extremities: No pitting edema Skin: Skin color, texture, turgor normal. No rashes or lesions Neurologic: Grossly normal Psych: Pleasant  ASSESSMENT & PLAN:    Right MCA [id number] Chronic systolic heart failure (HCC) Essential hypertension Mixed hyperlipidemia OSA on BiPAP Morbid obesity  Ross Schmidt unfortunately had a recent right MCA stroke.  He was hypertensive on admission.  His meds were adjusted but he was taken off of Entresto and switched to losartan/HCTZ for unclear reasons - although the  valsartan dose in Entresto is lower than losartan, it does not mean it is less effective.  This will not give him the same cardiovascular risk reduction related to his heart failure.  He was on moderate dose Entresto and I advised going back to that  but increasing it to the high dose instead of the losartan.  We will plan to continue his other heart failure medicines.  LVEF was on the low normal side at 50 to 55%.  He is also on Jardiance 25 mg daily, Trulicity and metformin with an A1c of 8.2%.  He will stay on aspirin and clopidogrel for at least 3 months.  Continue carvedilol and clonidine as well as hydralazine.  Encourage compliance with CPAP.  We discussed the risks and benefits of a transesophageal echocardiogram and he is agreeable to proceed.  I would be able to perform that in a couple of weeks at [facility].  Shared Decision Making/Informed Consent The risks [esophageal damage, perforation (1:10,000 risk), bleeding, pharyngeal hematoma as well as other potential complications associated with conscious sedation including aspiration, arrhythmia, respiratory failure and death], benefits (treatment guidance and diagnostic support) and alternatives of a transesophageal echocardiogram were discussed in detail with Ross Schmidt and he is willing to proceed.    Patient Risk:   After full review of this patients clinical status, I feel that they are at least moderate risk at this time.  Time:   Today, I have spent 25 minutes with the patient with telehealth technology discussing TEE, heart failure, recent stroke, hypertension management.     Medication Adjustments/Labs and Tests Ordered: Current medicines are reviewed at length with the patient today.  Concerns regarding medicines are outlined above.   Tests Ordered: Orders Placed This Encounter  Procedures   Basic metabolic panel   CBC   TRANSESOPHAGEAL ECHOCARDIOGRAM W/O CARDIOVERSION    Medication Changes: Meds ordered this  encounter  Medications   sacubitril-valsartan (ENTRESTO) 97-103 MG    Sig: Take 1 tablet by mouth 2 (two) times daily.    Dispense:  60 tablet    Refill:  11    STOP losartan-hctz    Disposition:  in 2 month(s)  Pixie Casino, MD, [facility], [place] Director of the Advanced Lipid Disorders &  Cardiovascular Risk Reduction Clinic Diplomate of the American Board of Clinical Lipidology Attending Cardiologist  Direct Dial: [phone]  Fax: [phone]  Website:  [email or web address]  Pixie Casino, MD  07/10/2022 10:14 AM

## 2022-07-10 NOTE — Patient Instructions (Signed)
  Medication Instructions:   STOP losartan-hctz  START entreto 97-103mg  twice daily *If you need a refill on your cardiac medications before your next appointment, please call your pharmacy*   Lab Work:  BMET and CBC before TEE - non-fasting - can be done at any [facility]  If you have labs (blood work) drawn today and your tests are completely normal, you will receive your results only by: [place] (if you have MyChart) OR A paper copy in the mail If you have any lab test that is abnormal or we need to change your treatment, we will call you to review the results.   Testing/Procedures:  TEE at [facility] with Dr. Debara Pickett on 10/12   Follow-Up: At [facility], you and your health needs are our priority.  As part of our continuing mission to provide you with exceptional heart care, we have created designated Provider Care Teams.  These Care Teams include your primary Cardiologist (physician) and Advanced Practice Providers (APPs -  Physician Assistants and Nurse Practitioners) who all work together to provide you with the care you need, when you need it.  We recommend signing up for the patient portal called "MyChart".  Sign up information is provided on this After Visit Summary.  MyChart is used to connect with patients for Virtual Visits (Telemedicine).  Patients are able to view lab/test results, encounter notes, upcoming appointments, etc.  Non-urgent messages can be sent to your provider as well.   To learn more about what you can do with MyChart, go to [email or web address].    Your next appointment:    End of October with PA   Procedure Instructions:  Dear Libby Maw  You are scheduled for a Transesophageal Echocardiogram (TEE) on 07/23/2022 with Dr. Debara Pickett.  Please arrive at the [facility] (Main Entrance A) at [facility]: [address] [place],  [address] at 9:00 AM.   DIET: Nothing to eat or drink after midnight except a sip of  water with medications (see medication instructions below)  FYI: For your safety, and to allow [place] to monitor your vital signs accurately during the surgery/procedure we request that   if you have artificial nails, gel coating, SNS etc. Please have those removed prior to your surgery/procedure. Not having the nail coverings /polish removed may result in cancellation or delay of your surgery/procedure.   Medication Instructions: Hold diabetes medications the day of procedure   Labs: If patient is on Coumadin, patient needs pt/INR, CBC, BMET within 3 days (No pt/INR needed for patients taking Xarelto, Eliquis, Pradaxa) For patients receiving anesthesia for TEE and all Cardioversion patients: BMET, CBC within 1 week  Come to: any [facility] location:  [place] [address] (Dr. Dillard's office) - [place] ([place]) - [place] [address]  [place] - [address]    Dennis Bast must have a responsible person to drive you home and stay in the waiting area during your procedure. Failure to do so could result in cancellation.  Bring your insurance cards.  *Special Note: Every effort is made to have your procedure done on time. Occasionally there are emergencies that occur at the hospital that may cause delays. Please be patient if a delay does occur.

## 2022-07-15 ENCOUNTER — Other Ambulatory Visit: Payer: Self-pay | Admitting: Internal Medicine

## 2022-07-15 ENCOUNTER — Telehealth: Payer: Self-pay | Admitting: Internal Medicine

## 2022-07-15 DIAGNOSIS — I639 Cerebral infarction, unspecified: Secondary | ICD-10-CM

## 2022-07-15 DIAGNOSIS — I5022 Chronic systolic (congestive) heart failure: Secondary | ICD-10-CM

## 2022-07-15 NOTE — Telephone Encounter (Signed)
Spoke with patient. Reviewed TEE instructions and AVS instructions. He will NOT need CBC/BMET prior to TEE as this was done on 07/06/22 and WNL  He has not yet received entresto - said PA is needed - no notice received from pharmacy about this to date. Will get assistance from [place] for this.

## 2022-07-15 NOTE — Telephone Encounter (Signed)
Patient called to report he is missing some parts to his equipment.  Patient would also like to know how long he should be wearing the equipment.

## 2022-07-15 NOTE — Telephone Encounter (Signed)
Spoke with pt he states that his monitor did not come with leads and he cannot apply monitor. He states that he will call [employer] to send him some leads.

## 2022-07-23 ENCOUNTER — Ambulatory Visit (HOSPITAL_BASED_OUTPATIENT_CLINIC_OR_DEPARTMENT_OTHER): Payer: Medicaid Other | Admitting: Certified Registered"

## 2022-07-23 ENCOUNTER — Encounter (HOSPITAL_COMMUNITY)
Admission: RE | Disposition: A | Discharge status: Home / Self Care | Payer: Self-pay | Source: Home / Self Care | Attending: Internal Medicine

## 2022-07-23 ENCOUNTER — Other Ambulatory Visit: Payer: Self-pay

## 2022-07-23 ENCOUNTER — Ambulatory Visit (HOSPITAL_COMMUNITY)
Admission: RE | Admit: 2022-07-23 | Discharge: 2022-07-23 | Disposition: A | Discharge status: Home / Self Care | Payer: Medicaid Other | Attending: Internal Medicine | Admitting: Internal Medicine

## 2022-07-23 ENCOUNTER — Ambulatory Visit (HOSPITAL_BASED_OUTPATIENT_CLINIC_OR_DEPARTMENT_OTHER)
Admission: RE | Admit: 2022-07-23 | Discharge: 2022-07-23 | Disposition: A | Discharge status: Home / Self Care | Payer: Medicaid Other | Source: Ambulatory Visit | Attending: Internal Medicine | Admitting: Internal Medicine

## 2022-07-23 ENCOUNTER — Ambulatory Visit (HOSPITAL_COMMUNITY): Payer: Medicaid Other | Admitting: Certified Registered"

## 2022-07-23 ENCOUNTER — Telehealth: Payer: Self-pay | Admitting: Internal Medicine

## 2022-07-23 ENCOUNTER — Encounter (HOSPITAL_COMMUNITY): Payer: Self-pay | Admitting: Internal Medicine

## 2022-07-23 DIAGNOSIS — I081 Rheumatic disorders of both mitral and tricuspid valves: Secondary | ICD-10-CM

## 2022-07-23 DIAGNOSIS — I272 Pulmonary hypertension, unspecified: Secondary | ICD-10-CM | POA: Diagnosis not present

## 2022-07-23 DIAGNOSIS — I428 Other cardiomyopathies: Secondary | ICD-10-CM | POA: Insufficient documentation

## 2022-07-23 DIAGNOSIS — E119 Type 2 diabetes mellitus without complications: Secondary | ICD-10-CM | POA: Diagnosis not present

## 2022-07-23 DIAGNOSIS — I509 Heart failure, unspecified: Secondary | ICD-10-CM

## 2022-07-23 DIAGNOSIS — I639 Cerebral infarction, unspecified: Secondary | ICD-10-CM | POA: Diagnosis not present

## 2022-07-23 DIAGNOSIS — E782 Mixed hyperlipidemia: Secondary | ICD-10-CM | POA: Diagnosis not present

## 2022-07-23 DIAGNOSIS — Z7985 Long-term (current) use of injectable non-insulin antidiabetic drugs: Secondary | ICD-10-CM | POA: Diagnosis not present

## 2022-07-23 DIAGNOSIS — I110 Hypertensive heart disease with heart failure: Secondary | ICD-10-CM

## 2022-07-23 DIAGNOSIS — Q2112 Patent foramen ovale: Secondary | ICD-10-CM | POA: Diagnosis not present

## 2022-07-23 DIAGNOSIS — G4733 Obstructive sleep apnea (adult) (pediatric): Secondary | ICD-10-CM | POA: Insufficient documentation

## 2022-07-23 DIAGNOSIS — Z7984 Long term (current) use of oral hypoglycemic drugs: Secondary | ICD-10-CM | POA: Diagnosis not present

## 2022-07-23 DIAGNOSIS — I5022 Chronic systolic (congestive) heart failure: Secondary | ICD-10-CM | POA: Insufficient documentation

## 2022-07-23 DIAGNOSIS — I517 Cardiomegaly: Secondary | ICD-10-CM

## 2022-07-23 DIAGNOSIS — I255 Ischemic cardiomyopathy: Secondary | ICD-10-CM | POA: Insufficient documentation

## 2022-07-23 DIAGNOSIS — Z6841 Body Mass Index (BMI) 40.0 and over, adult: Secondary | ICD-10-CM | POA: Diagnosis not present

## 2022-07-23 DIAGNOSIS — Z87891 Personal history of nicotine dependence: Secondary | ICD-10-CM | POA: Insufficient documentation

## 2022-07-23 HISTORY — PX: BUBBLE STUDY: SHX6837

## 2022-07-23 HISTORY — PX: TEE WITHOUT CARDIOVERSION: SHX5443

## 2022-07-23 LAB — GLUCOSE, CAPILLARY
Glucose-Capillary: 168 mg/dL — ABNORMAL HIGH (ref 70–99)
Glucose-Capillary: 146 mg/dL — ABNORMAL HIGH (ref 70–99)

## 2022-07-23 SURGERY — ECHOCARDIOGRAM, TRANSESOPHAGEAL
Anesthesia: Monitor Anesthesia Care

## 2022-07-23 MED ORDER — SODIUM CHLORIDE 0.9 % IV SOLN: INTRAVENOUS | Status: DC

## 2022-07-23 MED ORDER — DEXMEDETOMIDINE HCL IN NACL 80 MCG/20ML IV SOLN
INTRAVENOUS | Status: DC | PRN
  Administered 2022-07-23: 4 ug via BUCCAL
  Administered 2022-07-23 (×2): 8 ug via BUCCAL

## 2022-07-23 MED ORDER — PROPOFOL 500 MG/50ML IV EMUL
INTRAVENOUS | Status: DC | PRN
  Administered 2022-07-23: 150 ug/kg/min via INTRAVENOUS

## 2022-07-23 MED ORDER — LABETALOL HCL 5 MG/ML IV SOLN
INTRAVENOUS | Status: AC
  Filled 2022-07-23: qty 4

## 2022-07-23 MED ORDER — BUTAMBEN-TETRACAINE-BENZOCAINE 2-2-14 % EX AERO
INHALATION_SPRAY | CUTANEOUS | Status: DC | PRN
  Administered 2022-07-23: 1 via TOPICAL

## 2022-07-23 MED ORDER — PROPOFOL 10 MG/ML IV BOLUS
INTRAVENOUS | Status: DC | PRN
  Administered 2022-07-23: 40 mg via INTRAVENOUS
  Administered 2022-07-23: 20 mg via INTRAVENOUS

## 2022-07-23 MED ORDER — LABETALOL HCL 5 MG/ML IV SOLN
5.0000 mg | Freq: Once | INTRAVENOUS | Status: AC
  Administered 2022-07-23: 5 mg via INTRAVENOUS

## 2022-07-23 MED ORDER — LIDOCAINE HCL (CARDIAC) PF 100 MG/5ML IV SOSY
PREFILLED_SYRINGE | INTRAVENOUS | Status: DC | PRN
  Administered 2022-07-23: 50 mg via INTRAVENOUS
  Administered 2022-07-23: 100 mg via INTRAVENOUS
  Administered 2022-07-23: 75 mg via INTRAVENOUS

## 2022-07-23 MED ORDER — GLYCOPYRROLATE 0.2 MG/ML IJ SOLN
INTRAMUSCULAR | Status: DC | PRN
  Administered 2022-07-23: .1 mg via INTRAVENOUS

## 2022-07-23 NOTE — Interval H&P Note (Signed)
History and Physical Interval Note:  07/23/2022 9:33 AM  Ross Schmidt  has presented today for surgery, with the diagnosis of STROKE.  The various methods of treatment have been discussed with the patient and family. After consideration of risks, benefits and other options for treatment, the patient has consented to  Procedure(s): TRANSESOPHAGEAL ECHOCARDIOGRAM (TEE) (N/A) as a surgical intervention.  The patient's history has been reviewed, patient examined, no change in status, stable for surgery.  I have reviewed the patient's chart and labs.  Questions were answered to the patient's satisfaction.     Pixie Casino

## 2022-07-23 NOTE — CV Procedure (Signed)
TRANSESOPHAGEAL ECHOCARDIOGRAM (TEE) NOTE  INDICATIONS: Cryptogenic stroke  PROCEDURE:   Informed consent was obtained prior to the procedure. The risks, benefits and alternatives for the procedure were discussed and the patient comprehended these risks.  Risks include, but are not limited to, cough, sore throat, vomiting, nausea, somnolence, esophageal and stomach trauma or perforation, bleeding, low blood pressure, aspiration, pneumonia, infection, trauma to the teeth and death.    After a procedural time-out, the patient was given propofol for sedation by anesthesia. See their separate report.  The patient's heart rate, blood pressure, and oxygen saturation are monitored continuously during the procedure.The oropharynx was anesthetized with topical cetacaine.  The transesophageal probe was inserted in the esophagus and stomach without difficulty and multiple views were obtained.  The patient was kept under observation until the patient left the procedure room.  I was present face-to-face 100% of this time. The patient left the procedure room in stable condition.   Agitated microbubble saline contrast was administered.  COMPLICATIONS:    There were no immediate complications.  Findings:  LEFT VENTRICLE: The left ventricular wall thickness is severely increased.  The left ventricular cavity is normal in size. Wall motion is normal.  LVEF is 50-55%.  RIGHT VENTRICLE:  The right ventricle is normal in structure and function without any thrombus or masses.    LEFT ATRIUM:  The left atrium is moderately dilated in size without any thrombus or masses.  There is not spontaneous echo contrast ("smoke") in the left atrium consistent with a low flow state.  LEFT ATRIAL APPENDAGE:  The left atrial appendage is free of any thrombus or masses. The appendage has single lobes. Pulse doppler indicates moderate flow in the appendage.  ATRIAL SEPTUM:  The atrial septum demonstrates a small PFO with  bidirectional interatrial shunting by color doppler and saline microbubble.  RIGHT ATRIUM:  The right atrium is mildly dilated in size and function without any thrombus or masses. Prominent eustachian valve is noted.  MITRAL VALVE:  The mitral valve is normal in structure and function with  trivial  regurgitation.  There were no vegetations or stenosis.  AORTIC VALVE:  The aortic valve is trileaflet, normal in structure and function with  no  regurgitation.  There were no vegetations or stenosis  TRICUSPID VALVE:  The tricuspid valve is normal in structure and function with Mild regurgitation.  There were no vegetations or stenosis   PULMONIC VALVE:  The pulmonic valve is normal in structure and function with  no  regurgitation.  There were no vegetations or stenosis.   AORTIC ARCH, ASCENDING AND DESCENDING AORTA:  There was no Myrtis Ser et. Al, 1992) atherosclerosis of the ascending aorta, aortic arch, or proximal descending aorta.  12. PULMONARY VEINS: Anomalous pulmonary venous return was not noted.  13. PERICARDIUM: The pericardium appeared normal and non-thickened.  There is no pericardial effusion.  IMPRESSION:   Small PFO with bidirectional flow was noted by color doppler and saline microbubble contrast. No LAA thrombus Moderate right and mild left atrial enlargment Severe concentric LVH LVEF 50-55%  RECOMMENDATIONS:    Will ask Dr. Excell Seltzer to review images to see if PFO is significant enough to consider closure. My suspicion is that uncontrolled hypertension is more likely the etiology of his stroke.  Time Spent Directly with the Patient:  45 minutes   Chrystie Nose, MD, [facility], FACP  [place]  [facility]  Medical Director of the Advanced Lipid Disorders &  Cardiovascular Risk Reduction  Clinic Diplomate of the American Board of Clinical Lipidology Attending Cardiologist  Direct Dial: [phone]  Fax: [phone]  Website:  [email or web address]  Dudley Cooley 07/23/2022, 10:33 AM

## 2022-07-23 NOTE — Progress Notes (Signed)
  Echocardiogram Echocardiogram Transesophageal has been performed.  Ross Schmidt 07/23/2022, 10:43 AM

## 2022-07-23 NOTE — Transfer of Care (Signed)
Immediate Anesthesia Transfer of Care Note  Patient: Valeda Malm  Procedure(s) Performed: TRANSESOPHAGEAL ECHOCARDIOGRAM (TEE) BUBBLE STUDY  Patient Location: PACU  Anesthesia Type:MAC  Level of Consciousness: awake, alert  and oriented  Airway & Oxygen Therapy: Patient Spontanous Breathing  Post-op Assessment: Report given to RN and Post -op Vital signs reviewed and stable  Post vital signs: Reviewed and stable  Last Vitals:  Vitals Value Taken Time  BP 130/89 07/23/22 1030  Temp 36.2 C 07/23/22 1027  Pulse 82 07/23/22 1033  Resp 27 07/23/22 1033  SpO2 93 % 07/23/22 1033  Vitals shown include unvalidated device data.  Last Pain:  Vitals:   07/23/22 0908  TempSrc: Temporal  PainSc: 0-No pain         Complications: No notable events documented.

## 2022-07-23 NOTE — Telephone Encounter (Signed)
PA for entresto required Sent to PA nurse >> Joneen Caraway triage for completion on 10/4 Was sent to this Probation officer for MD signature but not completed until 10/12 d/t being out of office  Faxed PA form with MD note to [facility] Medicaid at [phone]

## 2022-07-23 NOTE — Progress Notes (Signed)
Mother called about Ross Schmidt needed physician approval as medicaid will not pay. Called Endo to relay message to Dr. Debara Pickett

## 2022-07-23 NOTE — Anesthesia Postprocedure Evaluation (Signed)
Anesthesia Post Note  Patient: Ross Schmidt  Procedure(s) Performed: TRANSESOPHAGEAL ECHOCARDIOGRAM (TEE) BUBBLE STUDY     Patient location during evaluation: Endoscopy Anesthesia Type: MAC Level of consciousness: awake Pain management: pain level controlled Vital Signs Assessment: post-procedure vital signs reviewed and stable Respiratory status: spontaneous breathing Cardiovascular status: stable Postop Assessment: no apparent nausea or vomiting Anesthetic complications: no   No notable events documented.  Last Vitals:  Vitals:   07/23/22 1045 07/23/22 1100  BP: 126/80 (!) 142/92  Pulse: 84 86  Resp: 15 16  Temp:  36.7 C  SpO2: 94% 94%    Last Pain:  Vitals:   07/23/22 1100  TempSrc:   PainSc: 0-No pain                 Huston Foley

## 2022-07-23 NOTE — Anesthesia Preprocedure Evaluation (Signed)
Anesthesia Evaluation  Patient identified by MRN, date of birth, ID band Patient awake    Reviewed: Allergy & Precautions, NPO status , Patient's Chart, lab work & pertinent test results, reviewed documented beta blocker date and time   Airway Mallampati: II       Dental  (+) Poor Dentition   Pulmonary asthma , sleep apnea , former smoker,    Pulmonary exam normal        Cardiovascular hypertension, Pt. on medications and Pt. on home beta blockers +CHF   Rhythm:Regular Rate:Normal     Neuro/Psych    GI/Hepatic   Endo/Other  diabetes, Type 2, Oral Hypoglycemic AgentsMorbid obesity  Renal/GU      Musculoskeletal   Abdominal (+) + obese,   Peds  Hematology   Anesthesia Other Findings   Reproductive/Obstetrics                             Anesthesia Physical Anesthesia Plan  ASA: 3  Anesthesia Plan: MAC   Post-op Pain Management: Minimal or no pain anticipated   Induction:   PONV Risk Score and Plan: Propofol infusion and TIVA  Airway Management Planned: Natural Airway and Mask  Additional Equipment: None and TEE  Intra-op Plan:   Post-operative Plan:   Informed Consent: I have reviewed the patients History and Physical, chart, labs and discussed the procedure including the risks, benefits and alternatives for the proposed anesthesia with the patient or authorized representative who has indicated his/her understanding and acceptance.     Dental advisory given  Plan Discussed with: CRNA  Anesthesia Plan Comments:         Anesthesia Quick Evaluation

## 2022-07-23 NOTE — Progress Notes (Signed)
PA was submitted today for Entresto - advised patient to reach out to pharmacy to try and have prescription filled. Case was also discussed with Dr. Burt Knack, who will confer with stroke neurology to see if PFO closure is indicated.  Pixie Casino, MD, [facility], [place] Director of the [facility]  Cardiovascular Risk Reduction Clinic Diplomate of the American Board of Clinical Lipidology Attending Cardiologist  Direct Dial: [phone]  Fax: [phone]  Website:  [email or web address]

## 2022-07-24 ENCOUNTER — Other Ambulatory Visit: Payer: Self-pay | Admitting: Neurology

## 2022-07-24 DIAGNOSIS — I639 Cerebral infarction, unspecified: Secondary | ICD-10-CM

## 2022-07-28 ENCOUNTER — Encounter (HOSPITAL_COMMUNITY): Payer: Self-pay | Admitting: Internal Medicine

## 2022-07-29 NOTE — Interval H&P Note (Signed)
History and Physical Interval Note:  07/29/2022 12:57 PM  Ney  has presented today for surgery, with the diagnosis of STROKE.  The various methods of treatment have been discussed with the patient and family. After consideration of risks, benefits and other options for treatment, the patient has consented to  Procedure(s): TRANSESOPHAGEAL ECHOCARDIOGRAM (TEE) (N/A) BUBBLE STUDY as a surgical intervention.  The patient's history has been reviewed, patient examined, no change in status, stable for surgery.  I have reviewed the patient's chart and labs.  Questions were answered to the patient's satisfaction.     Pixie Casino

## 2022-07-30 NOTE — Telephone Encounter (Addendum)
PA denied PA# [id number]  Policy requirements request did NOT meet: Medicaid beneficiary may have service restrictions due to their eligibility category that would make them ineligible for this service. The beneficiary is NOT enrolled in [facility] medicaid direct for the requested days. The beneficiary is enrolled thru a managed care/prepaid health plan. Please resubmit to the appropriate managed card/prepaid health plan.    Routed to [place] triage to assist, who completed initial PA form and sent to this writer to have signature completed and fax

## 2022-07-30 NOTE — Telephone Encounter (Signed)
Noted.  Will resubmit. °

## 2022-08-14 NOTE — Telephone Encounter (Signed)
No updates made available to [place] office. Will call and get update.

## 2022-08-14 NOTE — Telephone Encounter (Signed)
Will send an appeal with clinical documentation stating such w/prior echos.

## 2022-08-14 NOTE — Telephone Encounter (Signed)
Prior Authorization was denied as patient did not meet criteria of health plan: - Ejection Fraction of less than or equal to 40%.   Per prior echo, pt's EF was 50-55%.    Please advise.

## 2022-08-18 NOTE — Telephone Encounter (Signed)
Resubmitted prior authorization with ov notes, echo

## 2022-09-08 ENCOUNTER — Encounter: Payer: Self-pay | Admitting: Neurology

## 2022-09-08 ENCOUNTER — Ambulatory Visit: Payer: Self-pay | Admitting: Neurology

## 2022-09-18 ENCOUNTER — Telehealth: Payer: Self-pay

## 2022-09-18 NOTE — Telephone Encounter (Signed)
Entresto PA and multiple appeals denied coverage by insurance. OV notes, labs, tests, etc were sent as clinical documentation of patient's need for medication.   Please advise

## 2022-09-18 NOTE — Telephone Encounter (Signed)
Per Morrie Sheldon CMA, patient has been denied coverage for [facility].  Awaiting rationale for denial

## 2022-09-23 NOTE — Telephone Encounter (Signed)
Hilty, Lisette Abu, MD  Lindell Spar, RN Caller: Unspecified (Today,  8:11 AM) He was on this previously with LVEF as low as 35-40% - will have Cadence work on this.

## 2022-09-23 NOTE — Telephone Encounter (Signed)
Sent to Cadence PA

## 2022-09-23 NOTE — Telephone Encounter (Signed)
Received denial letter from A. Buck CMA correspondence  Patient has Medicaid  Ross Schmidt is covered if: - have type of long-term heart condition (chronic heart failure, NYHA class II-IV) with left side of heart pumping less than 40% (EF)   EF on TEE was 50-55%

## 2022-09-30 NOTE — Telephone Encounter (Signed)
Cadence PA sees patient on 10/06/22

## 2022-10-06 ENCOUNTER — Ambulatory Visit: Payer: Medicaid Other | Admitting: Medical

## 2022-10-06 NOTE — Progress Notes (Deleted)
Cardiology Office Note:    Date:  10/06/2022   ID:  Ross Schmidt, DOB [birth year], MRN [id number]  PCP:  Benetta Spar, MD  [facility] Cardiologist:  None  [facility] Electrophysiologist:  None   Referring MD: Benetta Spar*   Chief Complaint: ***  History of Present Illness:    Ross Schmidt is a 36 y.o. male with a hx of chronic systolic heart failure, COPD, diabetes, HLD, HTN, pulmonary hypertension, NICM with normalization of LVEF, malignant HTN, obesity, OSA who presents for follow-up.   Echo in 2017 showed LVEF 30-35%, mild LVH. Myoview Lexiscan showed small inferoapical ischemia, LVEF 42%, overall intermediate risk study.  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 Follow-up wcho in 2021 showed LVEF 60-65%,   The patient was admitted for a stroke in October.  TEE showe LVEF 50-55%, normal WMA, no thrombus or mass, atrial septum showed small PFO with bidirectional interatrial shunting by color doppler and saline microbubble, trivial MR, severe LVH. Plan was to discuss with Dr. Excell Seltzer if PFA closure was indicated.     Past Medical History:  Diagnosis Date   Asthma    CHF (congestive heart failure) (HCC)    COPD (chronic obstructive pulmonary disease) (HCC)    CVA (cerebral vascular accident) (HCC) 07/06/2022   Diabetes mellitus without complication (HCC)    HLD (hyperlipidemia) 08/11/2016   Hypertension    Obesity    Oxygen deficiency    Premature baby    Sleep apnea     Past Surgical History:  Procedure Laterality Date   BUBBLE STUDY  07/23/2022   Procedure: BUBBLE STUDY;  Surgeon: Chrystie Nose, MD;  Location: [facility] ENDOSCOPY;  Service: Cardiovascular;;   LEFT HEART CATH AND CORONARY ANGIOGRAPHY N/A 06/01/2017   Procedure: LEFT HEART CATH AND CORONARY ANGIOGRAPHY;  Surgeon: Corky Crafts, MD;  Location: [facility] INVASIVE CV LAB;  Service: Cardiovascular;  Laterality: N/A;   TEE WITHOUT CARDIOVERSION N/A 07/23/2022   Procedure:  TRANSESOPHAGEAL ECHOCARDIOGRAM (TEE);  Surgeon: Chrystie Nose, MD;  Location: [facility] ENDOSCOPY;  Service: Cardiovascular;  Laterality: N/A;   TONSILLECTOMY      Current Medications: No outpatient medications have been marked as taking for the 10/06/22 encounter (Appointment) with Fransico Michael, Anndee Connett H, PA-C.     Allergies:   Morphine and related and Nicoderm [nicotine]   Social History   Socioeconomic History   Marital status: Single    Spouse name: Not on file   Number of children: 0   Years of education: 10   Highest education level: Not on file  Occupational History    Comment: disability  Tobacco Use   Smoking status: Former    Packs/day: 0.25    Types: Cigarettes    Start date: 10/12/1997    Quit date: 02/10/2016    Years since quitting: 6.6   Smokeless tobacco: Never  Vaping Use   Vaping Use: Never used  Substance and Sexual Activity   Alcohol use: No   Drug use: No    Comment: hx of marijuana use, quit recently   Sexual activity: Not Currently  Other Topics Concern   Not on file  Social History Narrative   Lives with mother and step father   disabled   Social Determinants of Health   Financial Resource Strain: Not on file  Food Insecurity: No Food Insecurity (07/06/2022)   Hunger Vital Sign    Worried About Running Out of Food in the Last Year: Never true    Ran Out  of Food in the Last Year: Never true  Transportation Needs: No Transportation Needs (07/06/2022)   PRAPARE - Administrator, Civil Service (Medical): No    Lack of Transportation (Non-Medical): No  Physical Activity: Not on file  Stress: Not on file  Social Connections: Not on file     Family History: The patient's ***family history includes Diabetes in his maternal grandmother and maternal uncle; Heart disease in his maternal uncle; Hypertension in his maternal grandmother; Miscarriages / [place] in his maternal grandfather.  ROS:   Please see the history of present illness.    ***  All other systems reviewed and are negative.  EKGs/Labs/Other Studies Reviewed:    The following studies were reviewed today: ***  EKG:  EKG is *** ordered today.  The ekg ordered today demonstrates ***  Recent Labs: 07/06/2022: ALT 20; BUN 12; Creatinine, Ser 1.12; Hemoglobin 14.4; Platelets 272; Potassium 4.1; Sodium 140  Recent Lipid Panel    Component Value Date/Time   CHOL 137 07/07/2022 0407   TRIG 169 (H) 07/07/2022 0407   HDL 27 (L) 07/07/2022 0407   CHOLHDL 5.1 07/07/2022 0407   VLDL 34 07/07/2022 0407   LDLCALC 76 07/07/2022 0407     Risk Assessment/Calculations:   {Does this patient have ATRIAL FIBRILLATION?:[phone]}   Physical Exam:    VS:  There were no vitals taken for this visit.    Wt Readings from Last 3 Encounters:  07/23/22 (!) 340 lb (154.2 kg)  07/06/22 (!) 339 lb (153.8 kg)  12/02/21 (!) 346 lb (156.9 kg)     GEN: *** Well nourished, well developed in no acute distress HEENT: Normal NECK: No JVD; No carotid bruits LYMPHATICS: No lymphadenopathy CARDIAC: ***RRR, no murmurs, rubs, gallops RESPIRATORY:  Clear to auscultation without rales, wheezing or rhonchi  ABDOMEN: Soft, non-tender, non-distended MUSCULOSKELETAL:  No edema; No deformity  SKIN: Warm and dry NEUROLOGIC:  Alert and oriented x 3 PSYCHIATRIC:  Normal affect   ASSESSMENT:    No diagnosis found. PLAN:    In order of problems listed above:  ***  Disposition: Follow up {follow up:15908} with ***   Shared Decision Making/Informed Consent   {Are you ordering a CV Procedure (e.g. stress test, cath, DCCV, TEE, etc)?   Press F2        :[id number]}    Signed, Kendre Jacinto David Stall, PA-C  10/06/2022 9:51 AM    [facility]

## 2022-10-07 ENCOUNTER — Encounter: Payer: Self-pay | Admitting: Medical

## 2022-10-07 NOTE — Progress Notes (Deleted)
Cardiology Office Note   Date:  10/07/2022   ID:  PER BEAGLEY, DOB December 13, 1985, MRN [id number]  PCP:  Carrolyn Meiers, MD  Cardiologist:   Was Dr. Gilmer Mor     No chief complaint on file.     History of Present Illness: Ross Schmidt is a 36 y.o. male who presents for NICM follow up.   He has a history of chronic systolic heart failure, COPD, diabetes, HLD, HTN, pulmonary hypertension, non-ischemic cardiomyopathy, malignant hypertension, obesity, sleep apnea prior echo echocardiogram showed LVEF of 30 to 35%. Echocardiogram on 11/23/2019 showed significant improvement of EF to 60 to 65%.  Moderately increased concentric LVH.  Indeterminate diastolic filling due to EA fusion.   Sept 2023 admitted with CVA.  A repeat echo during the hospitalization showed his LVEF down to 50 to 55%. There was severe concentric LVH and grade 2 diastolic dysfunction. The left atrium was moderately dilated and the right atrium was mildly dilated.   He was hypertensive on admission.  His meds were adjusted but he was taken off of Entresto and switched to losartan/HCTZ for unclear reasons - although the valsartan dose in Entresto is lower than losartan, it does not mean it is less effective.  This will not give him the same cardiovascular risk reduction related to his heart failure.  He was on moderate dose Entresto and Dr. Debara Pickett advised going back to that but increasing it to the high dose instead of the losartan.  We will plan to continue his other heart failure medicines.  LVEF was on the low normal side at 50 to 55%.  He is also on Jardiance 25 mg daily, Trulicity and metformin with an A1c of 8.2%.  He will stay on aspirin and clopidogrel for at least 3 months.  Continue carvedilol and clonidine as well as hydralazine.  Encourage compliance with CPAP.  planned transesophageal echocardiogram were discussed and he was agreeable to proceed.    TEE 10/23  Left ventricular ejection fraction, by  estimation, is 50 to 55%. The left ventricle has low normal function. There is severe left ventricular hypertrophy.  Evidence of atrial level shunting detected by color flow Doppler. Agitated saline contrast bubble study was positive with shunting observed within 3-6 cardiac cycles suggestive of interatrial shunt. There is a small patent foramen ovale with  bidirectional shunting across atrial septum.   Pt will need to consult with Dr. Burt Knack.  ***  We have tried several times ie.  Several appeals to have him back on entresto but his insurance will not approve.     Today ****  Past Medical History:  Diagnosis Date   Asthma    CHF (congestive heart failure) (HCC)    COPD (chronic obstructive pulmonary disease) (HCC)    CVA (cerebral vascular accident) ([place]) 07/06/2022   Diabetes mellitus without complication ([place])    HLD (hyperlipidemia) 08/11/2016   Hypertension    Obesity    Oxygen deficiency    Premature baby    Sleep apnea     Past Surgical History:  Procedure Laterality Date   BUBBLE STUDY  07/23/2022   Procedure: BUBBLE STUDY;  Surgeon: Pixie Casino, MD;  Location: [place];  Service: Cardiovascular;;   LEFT HEART CATH AND CORONARY ANGIOGRAPHY N/A 06/01/2017   Procedure: LEFT HEART CATH AND CORONARY ANGIOGRAPHY;  Surgeon: Jettie Booze, MD;  Location: [place] [facility];  Service: Cardiovascular;  Laterality: N/A;   TEE WITHOUT CARDIOVERSION N/A 07/23/2022   Procedure:  TRANSESOPHAGEAL ECHOCARDIOGRAM (TEE);  Surgeon: Pixie Casino, MD;  Location: [facility] ENDOSCOPY;  Service: Cardiovascular;  Laterality: N/A;   TONSILLECTOMY       Current Outpatient Medications  Medication Sig Dispense Refill   allopurinol (ZYLOPRIM) 300 MG tablet Take 300 mg by mouth daily.     amLODipine (NORVASC) 10 MG tablet Take 1 tablet (10 mg total) by mouth daily. 30 tablet 0   atorvastatin (LIPITOR) 40 MG tablet TAKE 1 TABLET BY MOUTH DAILY 90 tablet 1   blood glucose meter kit and  supplies KIT Dispense based on patient and insurance preference. Use up to four times daily as directed. (FOR ICD-9 250.00, 250.01). 1 each 0   carvedilol (COREG) 25 MG tablet TAKE ONE TABLET BY MOUTH TWICE DAILY WITH FOOD 180 tablet 1   cloNIDine (CATAPRES) 0.2 MG tablet Take 1 tablet (0.2 mg total) by mouth 3 (three) times daily. 60 tablet 11   hydrALAZINE (APRESOLINE) 100 MG tablet Take 1 tablet (100 mg total) by mouth 3 (three) times daily. 270 tablet 1   JARDIANCE 25 MG TABS tablet Take 25 mg by mouth daily.     metFORMIN (GLUCOPHAGE) 1000 MG tablet Take 1 tablet (1,000 mg total) by mouth 2 (two) times daily. (Patient not taking: Reported on 07/21/2022) 180 tablet 1   NON FORMULARY Pt uses a bi-pap nightly     sacubitril-valsartan (ENTRESTO) 97-103 MG Take 1 tablet by mouth 2 (two) times daily. 60 tablet 11   TRULICITY 1.5 UJ/8.1XB SOPN Inject 1.5 mg into the skin once a week.     No current facility-administered medications for this visit.    Allergies:   Morphine and related and Nicoderm [nicotine]    Social History:  The patient  reports that he quit smoking about 6 years ago. His smoking use included cigarettes. He started smoking about 25 years ago. He smoked an average of .25 packs per day. He has never used smokeless tobacco. He reports that he does not drink alcohol and does not use drugs.   Family History:  The patient's family history includes Diabetes in his maternal grandmother and maternal uncle; Heart disease in his maternal uncle; Hypertension in his maternal grandmother; Miscarriages / [place] in his maternal grandfather.    ROS:  General:no colds or fevers, no weight changes Skin:no rashes or ulcers HEENT:no blurred vision, no congestion CV:see HPI PUL:see HPI GI:no diarrhea constipation or melena, no indigestion GU:no hematuria, no dysuria MS:no joint pain, no claudication Neuro:no syncope, no lightheadedness Endo:no diabetes, no thyroid disease Wt Readings  from Last 3 Encounters:  07/23/22 (!) 340 lb (154.2 kg)  07/06/22 (!) 339 lb (153.8 kg)  12/02/21 (!) 346 lb (156.9 kg)     PHYSICAL EXAM: VS:  There were no vitals taken for this visit. , BMI There is no height or weight on file to calculate BMI. General:Pleasant affect, NAD Skin:Warm and dry, brisk capillary refill HEENT:normocephalic, sclera clear, mucus membranes moist Neck:supple, no JVD, no bruits  Heart:S1S2 RRR without murmur, gallup, rub or click Lungs:clear without rales, rhonchi, or wheezes JYN:WGNF, non tender, + BS, do not palpate liver spleen or masses Ext:no lower ext edema, 2+ pedal pulses, 2+ radial pulses Neuro:alert and oriented, MAE, follows commands, + facial symmetry    EKG:  EKG is ordered today. The ekg ordered today demonstrates ***   Recent Labs: 07/06/2022: ALT 20; BUN 12; Creatinine, Ser 1.12; Hemoglobin 14.4; Platelets 272; Potassium 4.1; Sodium 140    Lipid Panel  Component Value Date/Time   CHOL 137 07/07/2022 0407   TRIG 169 (H) 07/07/2022 0407   HDL 27 (L) 07/07/2022 0407   CHOLHDL 5.1 07/07/2022 0407   VLDL 34 07/07/2022 0407   LDLCALC 76 07/07/2022 0407       Other studies Reviewed: Additional studies/ records that were reviewed today include: ***. TEE 07/23/22  Left ventricular ejection fraction, by estimation, is 50 to 55%. The  left ventricle has low normal function. There is severe left ventricular  hypertrophy.   2. Right ventricular systolic function is normal. The right ventricular  size is mildly enlarged.   3. Left atrial size was mildly dilated. No left atrial/left atrial  appendage thrombus was detected.   4. Right atrial size was moderately dilated.   5. The mitral valve is grossly normal. Trivial mitral valve  regurgitation.   6. The aortic valve is tricuspid. Aortic valve regurgitation is not  visualized.   7. Evidence of atrial level shunting detected by color flow Doppler.  Agitated saline contrast bubble  study was positive with shunting observed  within 3-6 cardiac cycles suggestive of interatrial shunt. There is a  small patent foramen ovale with  bidirectional shunting across atrial septum.   Conclusion(s)/Recommendation(s): Findings are concerning for an  interatrial shunt as detailed above. Case discussed with Dr. Burt Knack for  possible PFO closure - will confer with stroke neurology colleagues.   FINDINGS   Left Ventricle: Left ventricular ejection fraction, by estimation, is 50  to 55%. The left ventricle has low normal function. The left ventricular  internal cavity size was normal in size. There is severe left ventricular  hypertrophy.   Right Ventricle: The right ventricular size is mildly enlarged. No  increase in right ventricular wall thickness. Right ventricular systolic  function is normal.   Left Atrium: Left atrial size was mildly dilated. No left atrial/left  atrial appendage thrombus was detected.   Right Atrium: Right atrial size was moderately dilated. Prominent  Eustachian valve.   Pericardium: There is no evidence of pericardial effusion.   Mitral Valve: The mitral valve is grossly normal. Trivial mitral valve  regurgitation.   Tricuspid Valve: The tricuspid valve is grossly normal. Tricuspid valve  regurgitation is trivial.   Aortic Valve: The aortic valve is tricuspid. Aortic valve regurgitation is  not visualized.   Pulmonic Valve: The pulmonic valve was grossly normal. Pulmonic valve  regurgitation is not visualized.   Aorta: The aortic root and ascending aorta are structurally normal, with  no evidence of dilitation.   IAS/Shunts: The interatrial septum is aneurysmal. Evidence of atrial level  shunting detected by color flow Doppler. Agitated saline contrast was  given intravenously to evaluate for intracardiac shunting. Agitated saline  contrast bubble study was positive   with shunting observed within 3-6 cardiac cycles suggestive of   interatrial shunt. A small patent foramen ovale is detected with  bidirectional shunting across atrial septum.   Carotid dopplers 07/06/22 MPRESSION: 1. Minimal amount of left-sided intimal thickening/atherosclerotic plaque, not resulting in a hemodynamically significant stenosis. 2. Unremarkable sonographic evaluation of the right carotid system.    TTE 07/06/22 IMPRESSIONS     1. Left ventricular ejection fraction, by estimation, is 50 to 55%. The  left ventricle has low normal function. The left ventricle has no regional  wall motion abnormalities. There is severe concentric left ventricular  hypertrophy. Left ventricular  diastolic parameters are consistent with Grade II diastolic dysfunction  (pseudonormalization).   2. Right ventricular systolic function  is low normal. The right  ventricular size is normal. Tricuspid regurgitation signal is inadequate  for assessing PA pressure.   3. Left atrial size was moderately dilated.   4. Right atrial size was mildly dilated.   5. The mitral valve is grossly normal. Trivial mitral valve  regurgitation.   6. The aortic valve is tricuspid. Aortic valve regurgitation is not  visualized. Aortic valve mean gradient measures 4.0 mmHg.   7. The inferior vena cava is normal in size with greater than 50%  respiratory variability, suggesting right atrial pressure of 3 mmHg.   Comparison(s): Prior images reviewed side by side. LVEF low normal range  at 50-55%. Moderate diastolic dysfunction.   FINDINGS   Left Ventricle: Left ventricular ejection fraction, by estimation, is 50  to 55%. The left ventricle has low normal function. The left ventricle has  no regional wall motion abnormalities. The left ventricular internal  cavity size was normal in size.  There is severe concentric left ventricular hypertrophy. Left ventricular  diastolic parameters are consistent with Grade II diastolic dysfunction  (pseudonormalization).   Right  Ventricle: The right ventricular size is normal. No increase in  right ventricular wall thickness. Right ventricular systolic function is  low normal. Tricuspid regurgitation signal is inadequate for assessing PA  pressure.   Left Atrium: Left atrial size was moderately dilated.   Right Atrium: Right atrial size was mildly dilated.   Pericardium: There is no evidence of pericardial effusion.   Mitral Valve: The mitral valve is grossly normal. Trivial mitral valve  regurgitation.   Tricuspid Valve: The tricuspid valve is grossly normal. Tricuspid valve  regurgitation is trivial.   Cardiac catheterization 06/01/22  Mid LAD lesion, 25 %stenosed. There is moderate left ventricular systolic dysfunction. The left ventricular ejection fraction is 35-45% by visual estimate. LV end diastolic pressure is moderately elevated. LVEDP 29 mm Hg. There is no mitral valve regurgitation. There is no aortic valve stenosis.   Non-obstructive CAD.  He needs aggressive risk factor modification including weight loss.     Nonischemic cardiomyopathy.   RHC 1/79/15 AO Systolic Pressure 056 mmHg  AO Diastolic Pressure 97 mmHg  AO Mean 979 mmHg  LV Systolic Pressure 480 mmHg  LV Diastolic Pressure 10 mmHg  LV EDP 29 mmHg  AOp Systolic Pressure 165 mmHg  AOp Diastolic Pressure 537 mmHg  AOp Mean Pressure 482 mmHg  LVp Systolic Pressure 707 mmHg  LVp Diastolic Pressure 17 mmHg  LVp EDP Pressure 29 mmHg    ASSESSMENT AND PLAN:  1.  ***   Current medicines are reviewed with the patient today.  The patient Has no concerns regarding medicines.  The following changes have been made:  See above Labs/ tests ordered today include:see above  Disposition:   FU:  see above  Signed, Cecilie Kicks, NP  10/07/2022 9:07 PM    [facility] [place], [place], [place], [place] Phone: [phone]; Fax: [phone]

## 2022-10-08 ENCOUNTER — Ambulatory Visit: Payer: Medicaid Other | Admitting: Cardiology

## 2022-10-09 ENCOUNTER — Encounter: Payer: Self-pay | Admitting: Cardiology

## 2022-11-23 ENCOUNTER — Telehealth: Payer: Self-pay | Admitting: Physician Assistant

## 2022-12-03 ENCOUNTER — Inpatient Hospital Stay: Payer: Medicaid Other | Admitting: Internal Medicine

## 2022-12-07 ENCOUNTER — Telehealth: Payer: Self-pay | Admitting: Pulmonary Disease

## 2022-12-07 ENCOUNTER — Encounter: Payer: Self-pay | Admitting: Pulmonary Disease

## 2022-12-07 ENCOUNTER — Ambulatory Visit (INDEPENDENT_AMBULATORY_CARE_PROVIDER_SITE_OTHER): Payer: Medicaid Other | Admitting: Pulmonary Disease

## 2022-12-07 VITALS — BP 170/98 | HR 109 | Ht 69.0 in | Wt 352.4 lb

## 2022-12-07 DIAGNOSIS — G4733 Obstructive sleep apnea (adult) (pediatric): Secondary | ICD-10-CM | POA: Diagnosis not present

## 2022-12-07 DIAGNOSIS — R0609 Other forms of dyspnea: Secondary | ICD-10-CM

## 2022-12-07 DIAGNOSIS — I272 Pulmonary hypertension, unspecified: Secondary | ICD-10-CM

## 2022-12-07 NOTE — Telephone Encounter (Signed)
Patient would like order for new BIPAO machine. Patient using [facility] for BIPAP machine. Patient phone number is [phone].

## 2022-12-07 NOTE — Patient Instructions (Signed)
Needs to have device evaluated by [facility] to assess whether it is working  Get back to using BiPAP on a regular basis  BiPAP supplies  Follow-up in 3 months

## 2022-12-07 NOTE — Progress Notes (Signed)
Ross Schmidt    [id number]    Feb 26, 1986  Primary Care Physician:Fanta, Normajean Baxter, MD  Referring Physician: Carrolyn Meiers, MD [place],  [place] [address]  Chief complaint:   Patient being seen for follow-up visit History of obstructive sleep apnea, obesity  HPI:  Has not been using his BiPAP Feels the machine is not working well  Reformed smoker Morbid obesity, systolic heart failure, pulmonary hypertension  Denies any shortness of breath at rest  Functions well overall  His weight is stable Tries to remain active  Is compliant with BiPAP use he is on BiPAP of 25/20 with oxygen supplementation His sleep studies none on file States his machine works very well  Outpatient Encounter Medications as of 12/07/2022  Medication Sig   albuterol (VENTOLIN HFA) 108 (90 Base) MCG/ACT inhaler Inhale into the lungs.   allopurinol (ZYLOPRIM) 300 MG tablet Take 300 mg by mouth daily.   atorvastatin (LIPITOR) 40 MG tablet TAKE 1 TABLET BY MOUTH DAILY   blood glucose meter kit and supplies KIT Dispense based on patient and insurance preference. Use up to four times daily as directed. (FOR ICD-9 250.00, 250.01).   carvedilol (COREG) 25 MG tablet TAKE ONE TABLET BY MOUTH TWICE DAILY WITH FOOD   cloNIDine (CATAPRES) 0.2 MG tablet Take 1 tablet (0.2 mg total) by mouth 3 (three) times daily.   clopidogrel (PLAVIX) 75 MG tablet Take 75 mg by mouth daily.   furosemide (LASIX) 40 MG tablet Take by mouth.   JARDIANCE 25 MG TABS tablet Take 25 mg by mouth daily.   losartan-hydrochlorothiazide (HYZAAR) 100-25 MG tablet Take 1 tablet by mouth at bedtime.   NON FORMULARY Pt uses a bi-pap nightly   sacubitril-valsartan (ENTRESTO) 97-103 MG Take 1 tablet by mouth 2 (two) times daily.   TRULICITY 1.5 0000000 SOPN Inject 1.5 mg into the skin once a week.   amLODipine (NORVASC) 10 MG tablet Take 1 tablet (10 mg total) by mouth daily.   hydrALAZINE  (APRESOLINE) 100 MG tablet Take 1 tablet (100 mg total) by mouth 3 (three) times daily. (Patient not taking: Reported on 12/07/2022)   metFORMIN (GLUCOPHAGE) 1000 MG tablet Take 1 tablet (1,000 mg total) by mouth 2 (two) times daily. (Patient not taking: Reported on 12/07/2022)   No facility-administered encounter medications on file as of 12/07/2022.    Allergies as of 12/07/2022 - Review Complete 12/07/2022  Allergen Reaction Noted   Morphine and related Palpitations 09/14/2016   Nicoderm [nicotine] Rash 09/14/2016    Past Medical History:  Diagnosis Date   Asthma    CHF (congestive heart failure) (HCC)    COPD (chronic obstructive pulmonary disease) (HCC)    CVA (cerebral vascular accident) ([place]) 07/06/2022   Diabetes mellitus without complication ([place])    HLD (hyperlipidemia) 08/11/2016   Hypertension    Obesity    Oxygen deficiency    Premature baby    Sleep apnea     Past Surgical History:  Procedure Laterality Date   BUBBLE STUDY  07/23/2022   Procedure: BUBBLE STUDY;  Surgeon: Pixie Casino, MD;  Location: [place];  Service: Cardiovascular;;   LEFT HEART CATH AND CORONARY ANGIOGRAPHY N/A 06/01/2017   Procedure: LEFT HEART CATH AND CORONARY ANGIOGRAPHY;  Surgeon: Jettie Booze, MD;  Location: [place] [facility];  Service: Cardiovascular;  Laterality: N/A;   TEE WITHOUT CARDIOVERSION N/A 07/23/2022   Procedure: TRANSESOPHAGEAL ECHOCARDIOGRAM (TEE);  Surgeon: Debara Pickett,  Nadean Corwin, MD;  Location: [facility] ENDOSCOPY;  Service: Cardiovascular;  Laterality: N/A;   TONSILLECTOMY      Family History  Problem Relation Age of Onset   Diabetes Maternal Uncle    Heart disease Maternal Uncle    Diabetes Maternal Grandmother    Hypertension Maternal Grandmother    Miscarriages / Stillbirths Maternal Grandfather     Social History   Socioeconomic History   Marital status: Single    Spouse name: Not on file   Number of children: 0   Years of education: 10   Highest  education level: Not on file  Occupational History    Comment: disability  Tobacco Use   Smoking status: Former    Packs/day: 0.25    Types: Cigarettes    Start date: 10/12/1997    Quit date: 02/10/2016    Years since quitting: 6.8   Smokeless tobacco: Never  Vaping Use   Vaping Use: Never used  Substance and Sexual Activity   Alcohol use: No   Drug use: No    Comment: hx of marijuana use, quit recently   Sexual activity: Not Currently  Other Topics Concern   Not on file  Social History Narrative   Lives with mother and step father   disabled   Social Determinants of Health   Financial Resource Strain: Not on file  Food Insecurity: No Food Insecurity (07/06/2022)   Hunger Vital Sign    Worried About Running Out of Food in the Last Year: Never true    Ran Out of Food in the Last Year: Never true  Transportation Needs: No Transportation Needs (07/06/2022)   PRAPARE - Hydrologist (Medical): No    Lack of Transportation (Non-Medical): No  Physical Activity: Not on file  Stress: Not on file  Social Connections: Not on file  Intimate Partner Violence: Not At Risk (07/06/2022)   Humiliation, Afraid, Rape, and Kick questionnaire    Fear of Current or Ex-Partner: No    Emotionally Abused: No    Physically Abused: No    Sexually Abused: No    Review of Systems  Constitutional: Negative.   HENT: Negative.    Eyes: Negative.   Respiratory:  Positive for apnea.   Cardiovascular: Negative.   Gastrointestinal: Negative.   Endocrine: Negative.   Genitourinary: Negative.   Musculoskeletal: Negative.   Psychiatric/Behavioral:  Positive for sleep disturbance.     Vitals:   12/07/22 1459  BP: (!) 170/98  Pulse: (!) 109  SpO2: 92%     Physical Exam Constitutional:      Appearance: He is well-developed.     Comments: obese  HENT:     Head: Normocephalic.     Mouth/Throat:     Mouth: Mucous membranes are moist.  Eyes:     General:         Right eye: No discharge.        Left eye: No discharge.     Conjunctiva/sclera: Conjunctivae normal.     Pupils: Pupils are equal, round, and reactive to light.  Neck:     Thyroid: No thyromegaly.  Cardiovascular:     Rate and Rhythm: Normal rate and regular rhythm.  Pulmonary:     Effort: Pulmonary effort is normal. No respiratory distress.     Breath sounds: Normal breath sounds. No stridor. No wheezing or rhonchi.  Abdominal:     General: Bowel sounds are normal. There is no distension.     Palpations:  Abdomen is soft.     Tenderness: There is no abdominal tenderness.  Musculoskeletal:        General: No deformity. Normal range of motion.     Cervical back: No rigidity or tenderness.  Skin:    General: Skin is warm and dry.     Findings: No erythema.  Neurological:     Mental Status: He is alert and oriented to person, place, and time.     Cranial Nerves: No cranial nerve deficit.     Deep Tendon Reflexes: Reflexes are normal and symmetric.    Data Reviewed: Records reviewed  Assessment:   Obstructive sleep apnea -Encouraged to get back to using BiPAP on a regular basis  History of systolic heart failure  History of pulmonary hypertension  Risk of progression of disease with noncompliance  Plan/Recommendations:  Encouraged him to take his machine to [place] to have the machine looked at to make sure it is working well so I can continue using BiPAP  Encouraged to stay active  Encouraged to call with significant concerns    Sherrilyn Rist MD [facility] 12/07/2022, 3:23 PM  CC: Rosita Fire Demissie*

## 2022-12-08 LAB — BASIC METABOLIC PANEL
BUN: 17 (ref 4–21)
Creatinine: 1.2 (ref 0.6–1.3)
Glucose: 257

## 2022-12-08 LAB — HEMOGLOBIN A1C: Hemoglobin A1C: 10.3

## 2022-12-08 LAB — COMPREHENSIVE METABOLIC PANEL: eGFR: 82

## 2022-12-08 LAB — LIPID PANEL
LDL Cholesterol: 207
Triglycerides: 195 — AB (ref 40–160)

## 2022-12-08 NOTE — Telephone Encounter (Signed)
ATC pt unable to LVM b/c box not set up.

## 2022-12-10 NOTE — Telephone Encounter (Signed)
Called patient but he did not answer. Left message for him to call back. Will close encounter since this was the 2nd attempt.

## 2023-01-15 ENCOUNTER — Telehealth: Payer: Self-pay | Admitting: Pulmonary Disease

## 2023-01-15 DIAGNOSIS — G4733 Obstructive sleep apnea (adult) (pediatric): Secondary | ICD-10-CM

## 2023-01-15 NOTE — Telephone Encounter (Signed)
Pt calling in bc pt order for Bipap machine was not placed

## 2023-01-18 NOTE — Telephone Encounter (Signed)
Pt called the office stating that he needs a new order for his BIPAP to be sent to [facility] so he can get a new machine.  Dr. Val Eagle, please advise on this so we can get correct settings and all placed on this for pt.

## 2023-01-18 NOTE — Telephone Encounter (Signed)
ATC X1 LVM for patient to call the office back 

## 2023-01-19 NOTE — Telephone Encounter (Signed)
May place order for BiPAP 25/20  with heated humidification

## 2023-01-20 NOTE — Telephone Encounter (Signed)
Pt called back. Let him know the info from Dr. Val Eagle and that we were placing the order for bipap. Pt verbalized understanding. Order placed. Nothing further needed.

## 2023-01-20 NOTE — Telephone Encounter (Signed)
ATC LVMTCB X1 pended Bi Pap orders until pt can be reached

## 2023-03-04 ENCOUNTER — Ambulatory Visit: Payer: Medicaid Other | Attending: Internal Medicine | Admitting: Internal Medicine

## 2023-03-05 ENCOUNTER — Encounter: Payer: Self-pay | Admitting: Internal Medicine

## 2023-04-09 ENCOUNTER — Ambulatory Visit: Payer: Medicaid Other | Attending: Internal Medicine | Admitting: Nurse Practitioner

## 2023-04-09 ENCOUNTER — Encounter: Payer: Self-pay | Admitting: Nurse Practitioner

## 2023-04-09 VITALS — BP 179/117 | HR 100 | Ht 69.0 in | Wt 365.4 lb

## 2023-04-09 DIAGNOSIS — J449 Chronic obstructive pulmonary disease, unspecified: Secondary | ICD-10-CM

## 2023-04-09 DIAGNOSIS — Z79899 Other long term (current) drug therapy: Secondary | ICD-10-CM

## 2023-04-09 DIAGNOSIS — I1A0 Resistant hypertension: Secondary | ICD-10-CM

## 2023-04-09 DIAGNOSIS — I2510 Atherosclerotic heart disease of native coronary artery without angina pectoris: Secondary | ICD-10-CM

## 2023-04-09 DIAGNOSIS — I272 Pulmonary hypertension, unspecified: Secondary | ICD-10-CM

## 2023-04-09 DIAGNOSIS — G4733 Obstructive sleep apnea (adult) (pediatric): Secondary | ICD-10-CM

## 2023-04-09 DIAGNOSIS — I5032 Chronic diastolic (congestive) heart failure: Secondary | ICD-10-CM | POA: Diagnosis not present

## 2023-04-09 DIAGNOSIS — E785 Hyperlipidemia, unspecified: Secondary | ICD-10-CM

## 2023-04-09 DIAGNOSIS — Z8673 Personal history of transient ischemic attack (TIA), and cerebral infarction without residual deficits: Secondary | ICD-10-CM

## 2023-04-09 MED ORDER — HYDROCHLOROTHIAZIDE 25 MG PO TABS: 25.0000 mg | ORAL_TABLET | Freq: Every day | ORAL | 3 refills | Status: DC

## 2023-04-09 MED ORDER — VALSARTAN 320 MG PO TABS: 320.0000 mg | ORAL_TABLET | Freq: Every day | ORAL | 2 refills | Status: AC

## 2023-04-09 MED ORDER — JARDIANCE 25 MG PO TABS: 25.0000 mg | ORAL_TABLET | Freq: Every day | ORAL | 2 refills | Status: DC

## 2023-04-09 NOTE — Addendum Note (Signed)
Addended by: Carmelina Paddock on: 04/09/2023 04:03 PM   Modules accepted: Orders

## 2023-04-09 NOTE — Progress Notes (Addendum)
Cardiology Office Note:  .   Date:  04/09/2023  ID:  [name], DOB [birth year], MRN [id number] PCP: Benetta Spar, MD  [facility] Providers Cardiologist:  Chrystie Nose, MD    History of Present Illness: .   Ross Schmidt is a 37 y.o. 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  TTE 11/2019 showed improved EF at 60-65%, moderate LVH.   Last seen by Dr. Rennis Golden on December 02, 2021. BP markedly elevated in office. Was out of Entresto.   Admitted 06/2022 for acute right MCA, underwent TEE - see results below. Was felt his stroke was d/t uncontrolled HTN.  Today he presents for follow-up. He states he is compliant with his blood pressures. On a good day, SBP is 150's, says other days SBP can be 190's. Has never taken Entresto. Denies any chest pain, shortness of breath, palpitations, syncope, presyncope, dizziness, orthopnea, PND, swelling or significant weight changes, acute bleeding, or claudication. Has noticed weight gain since last year. Needs assistance with getting his BiPAP. Denies any excessive salt intake, tobacco use, alcohol use, or illicit drug use.   Studies Reviewed: Marland Kitchen       EKG today reveals NSR, 99 bpm, nonspecific ST/T wave abnormality, seen on previous EKG, no acute ischemic changes.   TEE Echo 07/2022:  1. Left ventricular ejection fraction, by estimation, is 50 to 55%. The  left ventricle has low normal function. There is severe left ventricular  hypertrophy.   2. Right ventricular systolic function is normal. The right ventricular  size is mildly enlarged.   3. Left atrial size was mildly dilated. No left atrial/left atrial  appendage thrombus was detected.   4. Right atrial size was moderately dilated.   5. The mitral valve is grossly normal.  Trivial mitral valve  regurgitation.   6. The aortic valve is tricuspid. Aortic valve regurgitation is not  visualized.   7. Evidence of atrial level shunting detected by color flow Doppler.  Agitated saline contrast bubble study was positive with shunting observed  within 3-6 cardiac cycles suggestive of interatrial shunt. There is a  small patent foramen ovale with  bidirectional shunting across atrial septum.   Conclusion(s)/Recommendation(s): Findings are concerning for an  interatrial shunt as detailed above. Case discussed with Dr. Excell Seltzer for  possible PFO closure - will confer with stroke neurology colleagues.  LHC 05/2017:  Mid LAD lesion, 25 %stenosed. There is moderate left ventricular systolic dysfunction. The left ventricular ejection fraction is 35-45% by visual estimate. LV end diastolic pressure is moderately elevated. LVEDP 29 mm Hg. There is no mitral valve regurgitation. There is no aortic valve stenosis.   Non-obstructive CAD.  He needs aggressive risk factor modification including weight loss.     Nonischemic cardiomyopathy.  Risk Assessment/Calculations:    HYPERTENSION CONTROL Vitals:   04/09/23 1308 04/09/23 1320  BP: (!) 180/110 (!) 179/117    The patient's blood pressure is elevated above target today.  In order to address the patient's elevated BP: A current anti-hypertensive medication was adjusted today.; A new medication was prescribed today.; A referral to the Advanced Hypertension Clinic will be placed.; Follow up with general cardiology has been recommended.; Blood pressure will be monitored at home to determine if medication changes need to be made.  Physical Exam:   VS:  BP (!) 179/117 (BP Location: Left Arm, Patient Position: Sitting, Cuff Size: Large)   Pulse 100   Ht 5\' 9"  (1.753 m)   Wt (!) 365 lb 6.4 oz (165.7 kg)   SpO2 92%   BMI 53.96 kg/m    Wt Readings from Last 3 Encounters:  04/09/23 (!) 365 lb 6.4 oz (165.7 kg)   12/07/22 (!) 352 lb 6.4 oz (159.8 kg)  07/23/22 (!) 340 lb (154.2 kg)    GEN: Morbidly obese, 37 y.o. male in no acute distress NECK: No JVD; No carotid bruits CARDIAC: S1/S2, RRR, no murmurs, rubs, gallops RESPIRATORY:  Clear to auscultation without rales, wheezing or rhonchi  ABDOMEN: Soft, non-tender, non-distended EXTREMITIES:  Nonpitting edema to BLE; No deformity   ASSESSMENT AND PLAN: .    Resistant HTN, medication management Longstanding history of poorly controlled blood pressure. Has never taken Entresto. Will stop losartan-HTCZ and Rx hydrochlorothiazide 25 mg daily and start Valsartan 320 mg daily.  Continue amlodipine, carvedilol, and clonidine.  Could not tolerate hydralazine -have added this to his allergy list.  Given BP log, salty 6 (He will send me MyChart message in 2 weeks with BP readings), and cancelled referral to pulmonology for OSA/COPD/pulmonary hypertension,, already seeing Pulmonology for management.  Referring to advanced hypertension clinic (clinical pharm D) for further evaluation.  Arranging renal artery duplex to r/o RAS. Heart healthy diet and regular cardiovascular exercise encouraged. ED precautions discussed.   HFimpEF Stage C, NYHA class I-II symptoms.  NICM.  Felt likely due to uncontrolled hypertension.  TEE 07/2022 showed EF 50 to 55%. Weight is stable per his report.  Medication changes as mentioned above.  Will obtain BMET in 1 week. Low sodium diet, fluid restriction <2L, and daily weights encouraged. Educated to contact our office for weight gain of 2 lbs overnight or 5 lbs in one week.  Nonobstructive CAD, HLD Left heart cath in 2018 revealed nonobstructive CAD.  LDL 9/23 76.  See medication changes made above, continue rest of current medication regimen. Heart healthy diet and regular cardiovascular exercise encouraged.   COPD, OSA, pulmonary HTN OSA is not treated, waiting on BiPAP.  Denies any worsening symptoms.  Continue to follow-up with  pulmonology.  Morbid obesity   Weight gain since last year. Weight loss via diet and exercise encouraged. Discussed the impact being overweight would have on cardiovascular risk.  6. Hx of CVA Denies any new or worsening symptoms. No medication changes at this time - our office is not managing his medications for this. Continue to follow with PCP. Heart healthy diet and regular cardiovascular exercise encouraged.   Dispo: Follow-up with me or APP in 2 months or sooner if anything changes.   Signed, Sharlene Dory, NP

## 2023-04-09 NOTE — Patient Instructions (Addendum)
Medication Instructions:  Your physician has recommended you make the following change in your medication:  Stop losartan-hydrochlorothiazide and Hydralazine Start Hydrochlorothiazide 25 Mg and valsartan 320 Mg Continue all other medications as prescribed.  Labwork: [facility] 1 week at [facility]  Testing/Procedures: Your physician has requested that you have a renal artery duplex. During this test, an ultrasound is used to evaluate blood flow to the kidneys. Allow one hour for this exam. Do not eat after midnight the day before and avoid carbonated beverages. Take your medications as you usually do.  Follow-Up: Your physician recommends that you schedule a follow-up appointment in: 2 Months with Philis Nettle   Any Other Special Instructions Will Be Listed Below (If Applicable).  Referral sent to Pharm D         Mediterranean Diet  Why follow it? Research shows. Those who follow the Mediterranean diet have a reduced risk of heart disease  The diet is associated with a reduced incidence of Parkinson's and Alzheimer's diseases People following the diet may have longer life expectancies and lower rates of chronic diseases  The Dietary Guidelines for Americans recommends the Mediterranean diet as an eating plan to promote health and prevent disease  What Is the Mediterranean Diet?  Healthy eating plan based on typical foods and recipes of Mediterranean-style cooking The diet is primarily a plant based diet; these foods should make up a majority of meals   Starches - Plant based foods should make up a majority of meals - They are an important sources of vitamins, minerals, energy, antioxidants, and fiber - Choose whole grains, foods high in fiber and minimally processed items  - Typical grain sources include wheat, oats, barley, corn, brown rice, bulgar, farro, millet, polenta, couscous  - Various types of beans include chickpeas, lentils, fava beans, black beans, white beans   Fruits   Veggies - Large quantities of antioxidant rich fruits & veggies; 6 or more servings  - Vegetables can be eaten raw or lightly drizzled with oil and cooked  - Vegetables common to the traditional Mediterranean Diet include: artichokes, arugula, beets, broccoli, brussel sprouts, cabbage, carrots, celery, collard greens, cucumbers, eggplant, kale, leeks, lemons, lettuce, mushrooms, okra, onions, peas, peppers, potatoes, pumpkin, radishes, rutabaga, shallots, spinach, sweet potatoes, turnips, zucchini - Fruits common to the Mediterranean Diet include: apples, apricots, avocados, cherries, clementines, dates, figs, grapefruits, grapes, melons, nectarines, oranges, peaches, pears, pomegranates, strawberries, tangerines  Fats - Replace butter and margarine with healthy oils, such as olive oil, canola oil, and tahini  - Limit nuts to no more than a handful a day  - Nuts include walnuts, almonds, pecans, pistachios, pine nuts  - Limit or avoid candied, honey roasted or heavily salted nuts - Olives are central to the [employer] - can be eaten whole or used in a variety of dishes   Meats Protein - Limiting red meat: no more than a few times a month - When eating red meat: choose lean cuts and keep the portion to the size of deck of cards - Eggs: approx. 0 to 4 times a week  - Fish and lean poultry: at least 2 a week  - Healthy protein sources include, chicken, [place], lean beef, lamb - Increase intake of seafood such as tuna, salmon, trout, mackerel, shrimp, scallops - Avoid or limit high fat processed meats such as sausage and bacon  Dairy - Include moderate amounts of low fat dairy products  - Focus on healthy dairy such as fat free yogurt, skim  milk, low or reduced fat cheese - Limit dairy products higher in fat such as whole or 2% milk, cheese, ice cream  Alcohol - Moderate amounts of red wine is ok  - No more than 5 oz daily for women (all ages) and men older than age 65  - No more than 10 oz  of wine daily for men younger than 67  Other - Limit sweets and other desserts  - Use herbs and spices instead of salt to flavor foods  - Herbs and spices common to the traditional Mediterranean Diet include: basil, bay leaves, chives, cloves, cumin, fennel, garlic, lavender, marjoram, mint, oregano, parsley, pepper, rosemary, sage, savory, sumac, tarragon, thyme   It's not just a diet, it's a lifestyle:  The Mediterranean diet includes lifestyle factors typical of those in the region  Foods, drinks and meals are best eaten with others and savored Daily physical activity is important for overall good health This could be strenuous exercise like running and aerobics This could also be more leisurely activities such as walking, housework, yard-work, or taking the stairs Moderation is the key; a balanced and healthy diet accommodates most foods and drinks Consider portion sizes and frequency of consumption of certain foods   Meal Ideas & Options:  Breakfast:  Whole wheat toast or whole wheat English muffins with peanut butter & hard boiled egg Steel cut oats topped with apples & cinnamon and skim milk  Fresh fruit: banana, strawberries, melon, berries, peaches  Smoothies: strawberries, bananas, greek yogurt, peanut butter Low fat greek yogurt with blueberries and granola  Egg white omelet with spinach and mushrooms Breakfast couscous: whole wheat couscous, apricots, skim milk, cranberries  Sandwiches:  Hummus and grilled vegetables (peppers, zucchini, squash) on whole wheat bread   Grilled chicken on whole wheat pita with lettuce, tomatoes, cucumbers or tzatziki  [place] salad on whole wheat bread: tuna salad made with greek yogurt, olives, red peppers, capers, green onions Garlic rosemary lamb pita: lamb sauted with garlic, rosemary, salt & pepper; add lettuce, cucumber, greek yogurt to pita - flavor with lemon juice and black pepper  Seafood:  Mediterranean grilled salmon, seasoned with  garlic, basil, parsley, lemon juice and black pepper Shrimp, lemon, and spinach whole-grain pasta salad made with low fat greek yogurt  Seared scallops with lemon orzo  Seared tuna steaks seasoned salt, pepper, coriander topped with tomato mixture of olives, tomatoes, olive oil, minced garlic, parsley, green onions and cappers  Meats:  Herbed greek chicken salad with kalamata olives, cucumber, feta  Red bell peppers stuffed with spinach, bulgur, lean ground beef (or lentils) & topped with feta   Kebabs: skewers of chicken, tomatoes, onions, zucchini, squash  [place] burgers: made with red onions, mint, dill, lemon juice, feta cheese topped with roasted red peppers Vegetarian Cucumber salad: cucumbers, artichoke hearts, celery, red onion, feta cheese, tossed in olive oil & lemon juice  Hummus and whole grain pita points with a greek salad (lettuce, tomato, feta, olives, cucumbers, red onion) Lentil soup with celery, carrots made with vegetable broth, garlic, salt and pepper  Tabouli salad: parsley, bulgur, mint, scallions, cucumbers, tomato, radishes, lemon juice, olive oil, salt and pepper.   If you need a refill on your cardiac medications before your next appointment, please call your pharmacy.

## 2023-05-12 ENCOUNTER — Ambulatory Visit: Payer: Medicaid Other

## 2023-05-13 ENCOUNTER — Ambulatory Visit
Payer: Medicaid Other | Attending: Cardiovascular Disease | Admitting: Pharmacist Clinician (PhC)/ Clinical Pharmacy Specialist

## 2023-05-13 ENCOUNTER — Encounter: Payer: Self-pay | Admitting: Pharmacist Clinician (PhC)/ Clinical Pharmacy Specialist

## 2023-05-13 VITALS — BP 159/114 | HR 86

## 2023-05-13 DIAGNOSIS — I10 Essential (primary) hypertension: Secondary | ICD-10-CM | POA: Diagnosis not present

## 2023-05-13 NOTE — Assessment & Plan Note (Signed)
Assessment: BP is uncontrolled in office BP 159/114 mmHg;  above the goal (<130/80). Compliance is questionable.  He has hx of stroke, so it could be non-intentional on his part Tolerates current medications well without any side effects Denies SOB, palpitation, chest pain, headaches,or swelling Reiterated the importance of regular exercise and low salt diet   Plan:  Gave patient 7 day pill minder with AM/Noon/PM slots Filled one day with meds he brought, as well as gave written instruction on which meds are AM and which are PM.   Will take 6 AM tablets around 11 am daily and 4 PM tablets at 11 pm with snack.   Will send rx for hctz 25 mg, clopidogrel 75 mg and Jardiance 25 mg to pharmacy, as he did not have these Continue taking amlodipine, clonidine (twice daily), valsartan and carvedilol Renal artery scan for RAS scheduled for next week. Would like to see again in 10-14 days here to see how he's using the pill minder, but he lives in [place], can't drive, dependent on mother to drive for now. Patient to follow up with Sharlene Dory in 3 weeks  Labs ordered today:  none

## 2023-05-13 NOTE — Progress Notes (Signed)
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 male patient who was referred to the Advanced Hypertension Clinic by Ross Schmidt.  Ross Schmidt was seen on 04/09/23 with a BP of 179/117.  She stopped his losartan hctz 100 mg and prescribed valsartan hctz and hctz 25 mg.   His medication list has not been updated and I'm not sure what he actually takes.  There is no fill history for valsartan or hctz, but filled losartan last month.  Sherryll Burger is also on the med list, but no indication of having purchased that either.  Today he is in the office for follow up.  He tells me that he was first diagnosed with hypertension as a teenager, and has had trouble with it most of his life.  He brings a bag of medications from the pharmacy today, but it is not all of his current medications.  Losartan hctz and valsartan were both in the bag, we threw out the losartan to avoid him taking both medications simultaneously.   States compliance with meds, but they were last filled about 5 weeks ago, and most bottles are still about 1/2 full.  Does admit that he only takes clonidine twice daily.  His morning meds are usually taken around 11 am, and evening meds at dinner, around 5 pm.  He has not taken his medications today.    Blood  Pressure Goal:  140/90 - to start, then drop to 130/80 after getting to first goal  Current Medications: amlodipine 10 mg every day, carvedilol 25 mg bid, clonidine 0.2 mg tid, , valsartan 320 mg every day   Adherence Assessment  Do you ever forget to take your medication? [] Yes [x] No - patient denies missed doses  Do you ever skip doses due to side effects? [x] Yes - hydralazine, now d/c [] No  Do you have trouble affording your medicines? [] Yes [x] No  Are you ever unable to pick up your medication due to transportation difficulties? [] Yes [x] No   Adherence strategy: none  Previously tried:   hydralazine - dizziness  Family Hx: father hx unknown; mother heathy, no siblings or children    Social Hx:      Tobacco: no  - quit 7-8 years ago, quit marijuana about 3 months ago  Alcohol: no  Caffeine: no   Diet:    no bacon or sausage in 2 weeks, breakfast is cereal (apple jacks, fruitl loops, frosted flakes), toast, scrambled eggs; lunch meat is chicken or [place], some rotissire chicken, tacos with ground [place]; string beans, cabbage, salad (lettuce, cheese, croutons +/- protien); snacks are pretzels (unsalted), melon, popcorn, graham crackers  Exercise: none  Home BP readings:  home device broken  Accessory Clinical Findings    Lab Results  Component Value Date   CREATININE 1.12 07/06/2022   BUN 12 07/06/2022   NA 140 07/06/2022   K 4.1 07/06/2022   CL 103 07/06/2022   CO2 31 07/06/2022   Lab Results  Component Value Date   ALT 20 07/06/2022   AST 19 07/06/2022   ALKPHOS 69 07/06/2022   BILITOT 0.9 07/06/2022   Lab Results  Component Value Date   HGBA1C 8.2 (H) 07/07/2022    Screening for Secondary Hypertension:      Relevant Labs/Studies:    Latest Ref Rng & Units 07/06/2022   10:48 AM 06/01/2017   11:29 AM 12/15/2016    4:02 PM  Basic Labs  Sodium 135 - 145 mmol/L 140  144  142   Potassium 3.5 - 5.1 mmol/L 4.1  4.5  4.6   Creatinine 0.61 - 1.24 mg/dL 6.57   8.46  9.62        Latest Ref Rng & Units 12/15/2016    4:02 PM  Thyroid   TSH 0.40 - 4.50 mIU/L 1.86                 04/09/2023    3:26 PM  Renovascular   Renal Artery [place] Completed Yes      Home Medications    Current Outpatient Medications  Medication Sig Dispense Refill   amLODipine (NORVASC) 10 MG tablet Take 1 tablet (10 mg total) by mouth daily. 30 tablet 0   atorvastatin (LIPITOR) 40 MG tablet TAKE 1 TABLET BY MOUTH DAILY 90 tablet 1   carvedilol (COREG) 25 MG tablet TAKE ONE TABLET BY MOUTH TWICE DAILY WITH FOOD 180 tablet 1   cloNIDine (CATAPRES) 0.2 MG tablet Take 1 tablet (0.2 mg total) by mouth 3 (three) times daily. 60 tablet 11   doxycycline (VIBRAMYCIN) 100 MG capsule Take 100 mg by mouth 2 (two) times daily.     valsartan (DIOVAN) 320 MG tablet Take 1 tablet (320 mg total) by mouth daily. 90 tablet 2   albuterol (VENTOLIN HFA) 108 (90 Base) MCG/ACT inhaler Inhale into the lungs.     allopurinol (ZYLOPRIM) 300 MG tablet Take 300 mg by mouth daily.     blood glucose meter kit and supplies KIT Dispense based on patient and insurance preference. Use up to four times daily as directed. (FOR ICD-9 250.00, 250.01). 1 each 0   clopidogrel (PLAVIX) 75 MG tablet Take 75 mg by mouth daily.     diclofenac (VOLTAREN) 75 MG EC tablet Take 75 mg by mouth 2 (two) times daily.     furosemide (LASIX) 40 MG tablet Take by mouth.     hydrochlorothiazide (HYDRODIURIL) 25 MG tablet Take 1 tablet (25 mg total) by mouth daily. 90 tablet 3   JARDIANCE 25 MG TABS tablet Take 1 tablet (25 mg total) by mouth daily. 90 tablet 2   metFORMIN (GLUCOPHAGE) 1000 MG tablet Take 1 tablet (1,000 mg total) by mouth 2 (two) times daily. (Patient not taking: Reported on 12/07/2022) 180 tablet 1   NON FORMULARY Pt uses a bi-pap nightly     TRULICITY 1.5 MG/0.5ML SOPN Inject 1.5 mg into the skin once a week. (Patient not taking: Reported on 05/13/2023)     No current facility-administered medications for  this visit.     Assessment & Plan   HYPERTENSION CONTROL Vitals:   05/13/23 1310 05/13/23 1320  BP: (!) 164/113 (!) 159/114    The patient's blood pressure  is elevated above target today.  In order to address the patient's elevated BP:       HTN (hypertension) Assessment: BP is uncontrolled in office BP 159/114 mmHg;  above the goal (<130/80). Compliance is questionable.  He has hx of stroke, so it could be non-intentional on his part Tolerates current medications well without any side effects Denies SOB, palpitation, chest pain, headaches,or swelling Reiterated the importance of regular exercise and low salt diet   Plan:  Gave patient 7 day pill minder with AM/Noon/PM slots Filled one day with meds he brought, as well as gave written instruction on which meds are AM and which are PM.   Will take 6 AM tablets around 11 am daily and 4 PM tablets at 11 pm with snack.   Will send rx for hctz 25 mg, clopidogrel 75 mg and Jardiance 25 mg to pharmacy, as he did not have these Continue taking amlodipine, clonidine (twice daily), valsartan and carvedilol Renal artery scan for RAS scheduled for next week. Would like to see again in 10-14 days here to see how he's using the pill minder, but he lives in [place], can't drive, dependent on mother to drive for now. Patient to follow up with Ross Schmidt in 3 weeks  Labs ordered today:  none   Phillips Hay PharmD CPP [facility]  [address] [place], [place] [address] [phone]

## 2023-05-13 NOTE — Patient Instructions (Addendum)
Follow up appointment: with Sharlene Dory on Aug 21  Take your BP meds as follows:  AM:  valsartan 320 mg every day, carvedilol 25 mg,    **clopidogrel 75 mg, hydrochlorothiazide 25 mg, Jardiance 25 mg**  PM:  amlodipine 10 mg, carvedilol 25 mg    Check your blood pressure at home daily (if able) and keep record of the readings.  Hypertension "High blood pressure"  Hypertension is often called "The Silent Killer." It rarely causes symptoms until it is extremely  high or has done damage to other organs in the body. For this reason, y ou should have your  blood pressure checked regularly by your physician. We will check your blood pressure  every time you see a provider at one of our offices.   Your blood pressure reading consists of two numbers. Ideally, blood pressure should be  below 120/80. The first ("top") number is called the systolic pressure. It measures the  pressure in your arteries as your heart beats. The second ("bottom") number is called the diastolic pressure. It measures the pressure in your arteries as the heart relaxes between beats.  The benefits of getting your blood pressure under control are enormous. A 10-point  reduction in systolic blood pressure can reduce your risk of stroke by 27% and heart failure by 28%  Your blood pressure goal is 140/90  (first goal)  To check your pressure at home you will need to:  1. Sit up in a chair, with feet flat on the floor and back supported. Do not cross your ankles or legs. 2. Rest your left arm so that the cuff is about heart level. If the cuff goes on your upper arm,  then just relax the arm on the table, arm of the chair or your lap. If you have a wrist cuff, we  suggest relaxing your wrist against your chest (think of it as Pledging the Flag with the  wrong arm).  3. Place the cuff snugly around your arm, about 1 inch above the crook of your elbow. The  cords should be inside the groove of your elbow.  4. Sit  quietly, with the cuff in place, for about 5 minutes. After that 5 minutes press the power  button to start a reading. 5. Do not talk or move while the reading is taking place.  6. Record your readings on a sheet of paper. Although most cuffs have a memory, it is often  easier to see a pattern developing when the numbers are all in front of you.  7. You can repeat the reading after 1-3 minutes if it is recommended  Make sure your bladder is empty and you have not had caffeine or tobacco within the last 30 min  Always bring your blood pressure log with you to your appointments. If you have not brought your monitor in to be double checked for accuracy, please bring it to your next appointment.  You can find a list of quality blood pressure cuffs at validatebp.org

## 2023-05-17 ENCOUNTER — Telehealth (HOSPITAL_COMMUNITY): Payer: Self-pay | Admitting: Licensed Clinical Social Worker

## 2023-05-17 ENCOUNTER — Ambulatory Visit (HOSPITAL_COMMUNITY)
Admission: RE | Admit: 2023-05-17 | Discharge: 2023-05-17 | Disposition: A | Discharge status: Home / Self Care | Payer: Medicaid Other | Source: Ambulatory Visit | Attending: Nurse Practitioner | Admitting: Nurse Practitioner

## 2023-05-17 DIAGNOSIS — Z79899 Other long term (current) drug therapy: Secondary | ICD-10-CM | POA: Diagnosis not present

## 2023-05-17 NOTE — Telephone Encounter (Signed)
H&V Care Navigation CSW Progress Note  Pt requested to speak with CSW.  Pt has concerns with transportation.  Got ride from godmother today but not always able to get ride.  CSW informed pt of his transportation benefit through [facility]- provided him with number to set up future rides.  Pt also inquired about assistance with utility bills.  Reports he is up to date currently but worried about future needs.  CSW informed we might can help him connect to resources if he gets into trouble in the future.  Also informed him that he might qualify for reduced rate of electricity cause he reports he is on SSI- informed him to go to [facility] to get statement/application for [employer].  Patient is participating in a Managed Medicaid Plan:  Yes  SDOH Screenings   Food Insecurity: No Food Insecurity (07/06/2022)  Housing: Low Risk  (07/06/2022)  Transportation Needs: Unmet Transportation Needs (05/17/2023)  Utilities: Not At Risk (07/06/2022)  Tobacco Use: Medium Risk (05/13/2023)    Burna Sis, LCSW Clinical Social Worker [facility] Desk#: [phone] Cell#: [phone]

## 2023-05-17 NOTE — Progress Notes (Signed)
Renal artery duplex completed. Please see CV Procedures for preliminary results.  Shona Simpson, RVT 05/17/23 10:10 AM

## 2023-05-18 ENCOUNTER — Telehealth: Payer: Self-pay | Admitting: Pharmacy Technician

## 2023-05-18 ENCOUNTER — Other Ambulatory Visit (HOSPITAL_COMMUNITY): Payer: Self-pay

## 2023-05-18 NOTE — Telephone Encounter (Signed)
Pharmacy Patient Advocate Encounter  Received notification from [facility] that Prior Authorization for jardiance 25 mg has been APPROVED from 05/18/2023 to 05/17/2024. Ran test claim, Copay is $4.00. This test claim was processed through [facility]- copay amounts may vary at other pharmacies due to pharmacy/plan contracts, or as the patient moves through the different stages of their insurance plan.   PA #/Case ID/Reference #: [id number]

## 2023-05-19 ENCOUNTER — Telehealth: Payer: Self-pay | Admitting: *Deleted

## 2023-05-19 NOTE — Telephone Encounter (Signed)
-----   Message from Sharlene Dory sent at 05/18/2023  9:23 AM EDT ----- Normal study.   Sharlene Dory, AGNP-C

## 2023-05-19 NOTE — Telephone Encounter (Signed)
Mychart message sent with result Copy sent to PCP

## 2023-06-02 ENCOUNTER — Encounter: Payer: Self-pay | Admitting: Nurse Practitioner

## 2023-06-02 ENCOUNTER — Ambulatory Visit: Payer: Medicaid Other | Attending: Nurse Practitioner | Admitting: Nurse Practitioner

## 2023-06-02 VITALS — BP 138/80 | HR 77 | Ht 69.0 in | Wt 360.4 lb

## 2023-06-02 DIAGNOSIS — I10 Essential (primary) hypertension: Secondary | ICD-10-CM | POA: Diagnosis not present

## 2023-06-02 DIAGNOSIS — I272 Pulmonary hypertension, unspecified: Secondary | ICD-10-CM

## 2023-06-02 DIAGNOSIS — G4733 Obstructive sleep apnea (adult) (pediatric): Secondary | ICD-10-CM

## 2023-06-02 DIAGNOSIS — I5032 Chronic diastolic (congestive) heart failure: Secondary | ICD-10-CM | POA: Diagnosis not present

## 2023-06-02 DIAGNOSIS — E785 Hyperlipidemia, unspecified: Secondary | ICD-10-CM

## 2023-06-02 DIAGNOSIS — Z8673 Personal history of transient ischemic attack (TIA), and cerebral infarction without residual deficits: Secondary | ICD-10-CM

## 2023-06-02 DIAGNOSIS — I2510 Atherosclerotic heart disease of native coronary artery without angina pectoris: Secondary | ICD-10-CM

## 2023-06-02 DIAGNOSIS — Z79899 Other long term (current) drug therapy: Secondary | ICD-10-CM

## 2023-06-02 DIAGNOSIS — J449 Chronic obstructive pulmonary disease, unspecified: Secondary | ICD-10-CM

## 2023-06-02 MED ORDER — CLOPIDOGREL BISULFATE 75 MG PO TABS: 75.0000 mg | ORAL_TABLET | Freq: Every day | ORAL | 1 refills | Status: DC

## 2023-06-02 NOTE — Progress Notes (Unsigned)
Cardiology Office Note:  .   Date:  06/02/2023 ID:  [name], DOB 13-Apr-1986, MRN [id number] PCP: Benetta Spar, MD  [facility] Providers Cardiologist:  Chrystie Nose, MD    History of Present Illness: .   Ross Schmidt is a 37 y.o. 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  TTE 11/2019 showed improved EF at 60-65%, moderate LVH.   Last seen by Dr. Rennis Golden on December 02, 2021. BP markedly elevated in office. Was out of Entresto.   Admitted 06/2022 for acute right MCA, underwent TEE - see results below. Was felt his stroke was d/t uncontrolled HTN.  I saw him for follow-up on April 09, 2023. SBP averaging 150-190's. Never took Entresto. Denied any chest pain, shortness of breath, palpitations, syncope, presyncope, dizziness, orthopnea, PND, swelling or significant weight changes, acute bleeding, or claudication. Had noticed weight gain since last year. Needed assistance with getting his BiPAP. Denied any excessive salt intake, tobacco use, alcohol use, or illicit drug use.   Saw advanced HTN clinic on May 13, 2023.   Presents today for follow-up. BP has improved significantly since I last saw patient. Compliant with his medications. Overall he is doing well. Still hasn't heard anything regarding this BiPAP. Denies any chest pain, shortness of breath, palpitations, syncope, presyncope, dizziness, orthopnea, PND, swelling or significant weight changes, acute bleeding, or claudication.  Studies Reviewed: Marland Kitchen       EKG is not ordered today.   TEE Echo 07/2022:  1. Left ventricular ejection fraction, by estimation, is 50 to 55%. The  left ventricle has low normal function. There is severe left ventricular  hypertrophy.   2. Right ventricular systolic function is  normal. The right ventricular  size is mildly enlarged.   3. Left atrial size was mildly dilated. No left atrial/left atrial  appendage thrombus was detected.   4. Right atrial size was moderately dilated.   5. The mitral valve is grossly normal. Trivial mitral valve  regurgitation.   6. The aortic valve is tricuspid. Aortic valve regurgitation is not  visualized.   7. Evidence of atrial level shunting detected by color flow Doppler.  Agitated saline contrast bubble study was positive with shunting observed  within 3-6 cardiac cycles suggestive of interatrial shunt. There is a  small patent foramen ovale with  bidirectional shunting across atrial septum.   Conclusion(s)/Recommendation(s): Findings are concerning for an  interatrial shunt as detailed above. Case discussed with Dr. Excell Seltzer for  possible PFO closure - will confer with stroke neurology colleagues.  LHC 05/2017:  Mid LAD lesion, 25 %stenosed. There is moderate left ventricular systolic dysfunction. The left ventricular ejection fraction is 35-45% by visual estimate. LV end diastolic pressure is moderately elevated. LVEDP 29 mm Hg. There is no mitral valve regurgitation. There is no aortic valve stenosis.   Non-obstructive CAD.  He needs aggressive risk factor modification including weight loss.     Nonischemic cardiomyopathy.   Physical Exam:   VS:  BP 138/80 (BP Location: Right Arm, Patient Position: Sitting, Cuff Size: Large)   Pulse 77   Ht 5\' 9"  (1.753 m)   Wt (!) 360 lb 6.4 oz (163.5 kg)   SpO2 95%   BMI 53.22 kg/m    Wt Readings from Last 3 Encounters:  06/02/23 (!) 360 lb 6.4 oz (  163.5 kg)  04/09/23 (!) 365 lb 6.4 oz (165.7 kg)  12/07/22 (!) 352 lb 6.4 oz (159.8 kg)    GEN: Morbidly obese, 37 y.o. male in no acute distress NECK: No JVD; No carotid bruits CARDIAC: S1/S2, RRR, no murmurs, rubs, gallops RESPIRATORY:  Clear to auscultation without rales, wheezing or rhonchi  ABDOMEN: Soft, non-tender,  non-distended EXTREMITIES:  Nonpitting edema to BLE; No deformity   ASSESSMENT AND PLAN: .    Hx of resistant HTN Longstanding history of poorly controlled blood pressure. BP improved today at 138/80.  Now will work on SBP < 130. Continue current medication regimen.  Could not tolerate hydralazine -have previously added this to his allergy list.  Previously given BP log, salty 6.  No RAS seen on imaging. Heart healthy diet and regular cardiovascular exercise encouraged. ED precautions discussed. Will obtain CMET.   HFimpEF, medication management Stage C, NYHA class I-II symptoms.  NICM.  Felt likely due to uncontrolled hypertension.  TEE 07/2022 showed EF 50 to 55%. Weight is stable per his report.  Low sodium diet, fluid restriction <2L, and daily weights encouraged. Educated to contact our office for weight gain of 2 lbs overnight or 5 lbs in one week.Will continue current medication regimen. He is due for labs: Will obtain CBC and CMET.   Nonobstructive CAD, HLD, medication management Left heart cath in 2018 revealed nonobstructive CAD.  LDL 9/23 76.  Continue current medication regimen. Will obtain FLP and CMET. Heart healthy diet and regular cardiovascular exercise encouraged.   COPD, OSA, pulmonary HTN OSA is not treated, waiting on BiPAP.  Denies any worsening symptoms.  Continue to follow-up with pulmonology. Will contact pulmonologist for assistance with this.   Morbid obesity  Weight loss via diet and exercise encouraged. Discussed the impact being overweight would have on cardiovascular risk.  6. Hx of CVA Denies any new or worsening symptoms. No medication changes at this time - our office is not managing his medications for this. Continue to follow with PCP. Heart healthy diet and regular cardiovascular exercise encouraged.   Dispo: Will refill Plavix per his request. Follow-up with me or APP in 3-4 months or sooner if anything changes.   Signed, Sharlene Dory, NP

## 2023-06-02 NOTE — Patient Instructions (Addendum)
Medication Instructions:  Your physician recommends that you continue on your current medications as directed. Please refer to the Current Medication list given to you today.  Labwork: CMET, CBC, FLP at [facility] in 3-4 weeks- Labs have been faxed to them   Testing/Procedures: None  Follow-Up: Your physician recommends that you schedule a follow-up appointment in: 3-4 Months with Philis Nettle   Any Other Special Instructions Will Be Listed Below (If Applicable).  If you need a refill on your cardiac medications before your next appointment, please call your pharmacy.

## 2023-06-05 ENCOUNTER — Telehealth: Payer: Self-pay | Admitting: Pulmonary Disease

## 2023-06-05 NOTE — Telephone Encounter (Signed)
Can we follow-up on the BiPAP order for this patient, appears he has not received his BiPAP yet, order was placed in April

## 2023-06-11 NOTE — Telephone Encounter (Signed)
Called [facility] they have the order they will pull it and get it worked it fell through the cracks there

## 2023-06-16 ENCOUNTER — Ambulatory Visit (INDEPENDENT_AMBULATORY_CARE_PROVIDER_SITE_OTHER): Payer: Medicaid Other | Admitting: Pulmonary Disease

## 2023-06-16 ENCOUNTER — Encounter: Payer: Self-pay | Admitting: Pulmonary Disease

## 2023-06-16 VITALS — BP 156/92 | HR 77 | Temp 97.3°F | Ht 69.0 in | Wt 359.0 lb

## 2023-06-16 DIAGNOSIS — I272 Pulmonary hypertension, unspecified: Secondary | ICD-10-CM | POA: Diagnosis not present

## 2023-06-16 DIAGNOSIS — G4733 Obstructive sleep apnea (adult) (pediatric): Secondary | ICD-10-CM | POA: Diagnosis not present

## 2023-06-16 NOTE — Patient Instructions (Signed)
We will make sure will call the DME company for an update on your CPAP  I apologize that it is taking this long  We still have to have the medical supply company to send the prescription to to provide you with the machine  If you have a different DME company that you would like to try, let us know and we will be glad to provide the prescription  We will update you as things evolve  Tentative follow-up in 3 months

## 2023-06-16 NOTE — Progress Notes (Unsigned)
[address] [place]    [id number]    1985-11-28  Primary Care Physician:Fanta, Wayland Salinas, MD  Referring Physician: Benetta Spar, MD [address] [place],  [place] [address]  Chief complaint:   Patient being seen for follow-up visit History of obstructive sleep apnea, obesity  HPI:  He has not received his BiPAP  Order was sent to [facility] and we will check with [facility] again today to ascertain that processing of his order is ongoing  Still has daytime symptoms of sleepiness, tiredness  Reformed smoker Morbid obesity, systolic heart failure, pulmonary hypertension  Denies any shortness of breath at rest  Functions well overall  His weight is stable Tries to remain active  Diagnosed with sleep apnea in the past was on BiPAP Outpatient Encounter Medications as of 06/16/2023  Medication Sig   albuterol (VENTOLIN HFA) 108 (90 Base) MCG/ACT inhaler Inhale into the lungs.   allopurinol (ZYLOPRIM) 300 MG tablet Take 300 mg by mouth daily.   amLODipine (NORVASC) 10 MG tablet Take 1 tablet (10 mg total) by mouth daily.   atorvastatin (LIPITOR) 40 MG tablet TAKE 1 TABLET BY MOUTH DAILY   blood glucose meter kit and supplies KIT Dispense based on patient and insurance preference. Use up to four times daily as directed. (FOR ICD-9 250.00, 250.01).   carvedilol (COREG) 25 MG tablet TAKE ONE TABLET BY MOUTH TWICE DAILY WITH FOOD   cloNIDine (CATAPRES) 0.2 MG tablet Take 1 tablet (0.2 mg total) by mouth 3 (three) times daily.   furosemide (LASIX) 40 MG tablet Take by mouth.   valsartan (DIOVAN) 320 MG tablet Take 1 tablet (320 mg total) by mouth daily.   clopidogrel (PLAVIX) 75 MG tablet Take 1 tablet (75 mg total) by mouth daily. (Patient not taking: Reported on 06/16/2023)   diclofenac (VOLTAREN) 75 MG EC tablet Take 75 mg by mouth 2 (two) times daily. (Patient not taking: Reported on 06/16/2023)   hydrochlorothiazide (HYDRODIURIL) 25 MG tablet Take 1  tablet (25 mg total) by mouth daily. (Patient not taking: Reported on 06/16/2023)   JARDIANCE 25 MG TABS tablet Take 1 tablet (25 mg total) by mouth daily. (Patient not taking: Reported on 06/16/2023)   NON FORMULARY Pt uses a bi-pap nightly (Patient not taking: Reported on 06/16/2023)   TRULICITY 1.5 MG/0.5ML SOPN Inject 1.5 mg into the skin once a week. (Patient not taking: Reported on 05/13/2023)   [DISCONTINUED] doxycycline (VIBRAMYCIN) 100 MG capsule Take 100 mg by mouth 2 (two) times daily.   No facility-administered encounter medications on file as of 06/16/2023.    Allergies as of 06/16/2023 - Review Complete 06/16/2023  Allergen Reaction Noted   Hydralazine Other (See Comments) 04/09/2023   Morphine and codeine Palpitations 09/14/2016   Nicoderm [nicotine] Rash 09/14/2016    Past Medical History:  Diagnosis Date   Asthma    CHF (congestive heart failure) (HCC)    COPD (chronic obstructive pulmonary disease) (HCC)    CVA (cerebral vascular accident) (HCC) 07/06/2022   Diabetes mellitus without complication (HCC)    HLD (hyperlipidemia) 08/11/2016   Hypertension    Obesity    Oxygen deficiency    Premature baby    Sleep apnea     Past Surgical History:  Procedure Laterality Date   BUBBLE STUDY  07/23/2022   Procedure: BUBBLE STUDY;  Surgeon: Chrystie Nose, MD;  Location: [facility] ENDOSCOPY;  Service: Cardiovascular;;   LEFT HEART CATH AND CORONARY ANGIOGRAPHY N/A 06/01/2017  Procedure: LEFT HEART CATH AND CORONARY ANGIOGRAPHY;  Surgeon: Corky Crafts, MD;  Location: [facility] INVASIVE CV LAB;  Service: Cardiovascular;  Laterality: N/A;   TEE WITHOUT CARDIOVERSION N/A 07/23/2022   Procedure: TRANSESOPHAGEAL ECHOCARDIOGRAM (TEE);  Surgeon: Chrystie Nose, MD;  Location: [facility] ENDOSCOPY;  Service: Cardiovascular;  Laterality: N/A;   TONSILLECTOMY      Family History  Problem Relation Age of Onset   Diabetes Maternal Uncle    Heart disease Maternal Uncle    Diabetes Maternal  Grandmother    Hypertension Maternal Grandmother    Miscarriages / Stillbirths Maternal Grandfather     Social History   Socioeconomic History   Marital status: Single    Spouse name: Not on file   Number of children: 0   Years of education: 10   Highest education level: Not on file  Occupational History    Comment: disability  Tobacco Use   Smoking status: Former    Current packs/day: 0.00    Average packs/day: 0.3 packs/day for 18.3 years (4.6 ttl pk-yrs)    Types: Cigarettes    Start date: 10/12/1997    Quit date: 02/10/2016    Years since quitting: 7.3   Smokeless tobacco: Never  Vaping Use   Vaping status: Never Used  Substance and Sexual Activity   Alcohol use: No   Drug use: No    Comment: hx of marijuana use, quit recently   Sexual activity: Not Currently  Other Topics Concern   Not on file  Social History Narrative   Lives with mother and step father   disabled   Social Determinants of Health   Financial Resource Strain: Not on file  Food Insecurity: No Food Insecurity (07/06/2022)   Hunger Vital Sign    Worried About Running Out of Food in the Last Year: Never true    Ran Out of Food in the Last Year: Never true  Transportation Needs: Unmet Transportation Needs (05/17/2023)   PRAPARE - Administrator, Civil Service (Medical): Yes    Lack of Transportation (Non-Medical): No  Physical Activity: Not on file  Stress: Not on file  Social Connections: Not on file  Intimate Partner Violence: Not At Risk (07/06/2022)   Humiliation, Afraid, Rape, and Kick questionnaire    Fear of Current or Ex-Partner: No    Emotionally Abused: No    Physically Abused: No    Sexually Abused: No    Review of Systems  Constitutional: Negative.   HENT: Negative.    Eyes: Negative.   Respiratory:  Positive for apnea.   Cardiovascular: Negative.   Gastrointestinal: Negative.   Endocrine: Negative.   Genitourinary: Negative.   Musculoskeletal: Negative.    Psychiatric/Behavioral:  Positive for sleep disturbance.     Vitals:   06/16/23 1444 06/16/23 1450  BP: (!) 160/96 (!) 156/92  Pulse: 77   Temp: (!) 97.3 F (36.3 C)   SpO2: 93%      Physical Exam Constitutional:      Appearance: He is well-developed. He is obese.     Comments: obese  HENT:     Head: Normocephalic.     Mouth/Throat:     Mouth: Mucous membranes are moist.  Eyes:     General:        Right eye: No discharge.        Left eye: No discharge.     Conjunctiva/sclera: Conjunctivae normal.     Pupils: Pupils are equal, round, and reactive to light.  Neck:     Thyroid: No thyromegaly.  Cardiovascular:     Rate and Rhythm: Normal rate and regular rhythm.  Pulmonary:     Effort: Pulmonary effort is normal. No respiratory distress.     Breath sounds: Normal breath sounds. No stridor. No wheezing or rhonchi.  Abdominal:     General: Bowel sounds are normal. There is no distension.     Palpations: Abdomen is soft.     Tenderness: There is no abdominal tenderness.  Musculoskeletal:        General: No deformity. Normal range of motion.     Cervical back: No rigidity or tenderness.  Skin:    General: Skin is warm and dry.     Findings: No erythema.  Neurological:     Mental Status: He is alert and oriented to person, place, and time.     Cranial Nerves: No cranial nerve deficit.     Deep Tendon Reflexes: Reflexes are normal and symmetric.   Data Reviewed: Records reviewed  Assessment:   Obstructive sleep apnea -Encouraged to get back to using BiPAP on a regular basis -Order was placed for a new BiPAP machine -He has not received his new machine yet from [facility]  History of systolic heart failure -Encourage regular follow-up to optimize treatment  History of pulmonary hypertension  Risk of progression of disease with noncompliance  Plan/Recommendations:  Will reach out to [facility] to ascertain that his order is in process  Patient was also  encouraged to reach back to [place] if he wants the order sent to a different DME company  Encouraged to stay active  Encouraged to call with significant concerns    Virl Diamond MD [facility] 06/16/2023, 2:52 PM  CC: [place] Gully Demissie*

## 2023-08-31 NOTE — Patient Instructions (Signed)

## 2023-09-02 ENCOUNTER — Encounter (INDEPENDENT_AMBULATORY_CARE_PROVIDER_SITE_OTHER): Payer: Medicaid Other | Admitting: Nurse Practitioner

## 2023-09-02 DIAGNOSIS — Z7984 Long term (current) use of oral hypoglycemic drugs: Secondary | ICD-10-CM

## 2023-09-02 DIAGNOSIS — E782 Mixed hyperlipidemia: Secondary | ICD-10-CM

## 2023-09-02 DIAGNOSIS — I10 Essential (primary) hypertension: Secondary | ICD-10-CM

## 2023-09-02 DIAGNOSIS — Z794 Long term (current) use of insulin: Secondary | ICD-10-CM

## 2023-09-02 DIAGNOSIS — E1165 Type 2 diabetes mellitus with hyperglycemia: Secondary | ICD-10-CM

## 2023-09-02 NOTE — Progress Notes (Signed)
Erroneous encounter

## 2023-09-16 ENCOUNTER — Ambulatory Visit: Payer: Medicaid Other | Admitting: Nurse Practitioner

## 2023-09-27 ENCOUNTER — Ambulatory Visit: Payer: Medicaid Other | Admitting: Pulmonary Disease

## 2023-09-28 ENCOUNTER — Ambulatory Visit: Payer: Medicaid Other | Admitting: Urology

## 2023-10-04 ENCOUNTER — Ambulatory Visit: Payer: Medicaid Other | Admitting: Nurse Practitioner

## 2023-11-05 ENCOUNTER — Ambulatory Visit (INDEPENDENT_AMBULATORY_CARE_PROVIDER_SITE_OTHER): Payer: Medicaid Other | Admitting: Urology

## 2023-11-05 VITALS — BP 163/83 | HR 114

## 2023-11-05 DIAGNOSIS — Z412 Encounter for routine and ritual male circumcision: Secondary | ICD-10-CM

## 2023-11-05 DIAGNOSIS — N481 Balanitis: Secondary | ICD-10-CM

## 2023-11-05 MED ORDER — CLOTRIMAZOLE-BETAMETHASONE 1-0.05 % EX CREA: 1.0000 | TOPICAL_CREAM | Freq: Two times a day (BID) | CUTANEOUS | 3 refills | Status: DC

## 2023-11-05 NOTE — Progress Notes (Signed)
11/05/2023 11:52 AM   Ross Schmidt 1986/05/22 [id number]  Referring provider: Benetta Spar, MD [address] [place],  [place] [address]  Penile irritation   HPI: [name] is a 38yo here for evaluation of balanitis. For the past month he has noted irritation of his foreskin and penis. He cannot retract his foreskin for 4 months. 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    PMH: Past Medical History:  Diagnosis Date   Asthma    CHF (congestive heart failure) (HCC)    COPD (chronic obstructive pulmonary disease) (HCC)    CVA (cerebral vascular accident) (HCC) 07/06/2022   Diabetes mellitus without complication (HCC)    HLD (hyperlipidemia) 08/11/2016   Hypertension    Obesity    Oxygen deficiency    Premature baby    Sleep apnea     Surgical History: Past Surgical History:  Procedure Laterality Date   BUBBLE STUDY  07/23/2022   Procedure: BUBBLE STUDY;  Surgeon: Chrystie Nose, MD;  Location: [facility] ENDOSCOPY;  Service: Cardiovascular;;   LEFT HEART CATH AND CORONARY ANGIOGRAPHY N/A 06/01/2017   Procedure: LEFT HEART CATH AND CORONARY ANGIOGRAPHY;  Surgeon: Corky Crafts, MD;  Location: [facility]ASIVE CV LAB;  Service: Cardiovascular;  Laterality: N/A;   TEE WITHOUT CARDIOVERSION N/A 07/23/2022   Procedure: TRANSESOPHAGEAL ECHOCARDIOGRAM (TEE);  Surgeon: Chrystie Nose, MD;  Location: [facility] ENDOSCOPY;  Service: Cardiovascular;  Laterality: N/A;   TONSILLECTOMY      Home Medications:  Allergies as of 11/05/2023       Reactions   Hydralazine Other (See Comments)   Dizziness   Morphine And Codeine Palpitations   Heart beat fast    Nicoderm [nicotine] Rash   Rash from the patch         Medication List        Accurate as of November 05, 2023 11:52 AM. If you have any questions, ask your nurse or doctor.          STOP taking these medications    clopidogrel 75 MG tablet Commonly known as: PLAVIX Stopped  by: Wilkie Aye   diclofenac 75 MG EC tablet Commonly known as: VOLTAREN Stopped by: Wilkie Aye   hydrochlorothiazide 25 MG tablet Commonly known as: HYDRODIURIL Stopped by: Wilkie Aye   Jardiance 25 MG Tabs tablet Generic drug: empagliflozin Stopped by: Wilkie Aye   NON FORMULARY Stopped by: Wilkie Aye   Trulicity 1.5 MG/0.5ML Soaj Generic drug: Dulaglutide Stopped by: Wilkie Aye       TAKE these medications    albuterol 108 (90 Base) MCG/ACT inhaler Commonly known as: VENTOLIN HFA Inhale into the lungs.   allopurinol 300 MG tablet Commonly known as: ZYLOPRIM Take 300 mg by mouth daily.   amLODipine 10 MG tablet Commonly known as: NORVASC Take 1 tablet (10 mg total) by mouth daily.   atorvastatin 40 MG tablet Commonly known as: LIPITOR TAKE 1 TABLET BY MOUTH DAILY   blood glucose meter kit and supplies Kit Dispense based on patient and insurance preference. Use up to four times daily as directed. (FOR ICD-9 250.00, 250.01).   carvedilol 25 MG tablet Commonly known as: COREG TAKE ONE TABLET BY MOUTH TWICE DAILY WITH FOOD   cloNIDine 0.2 MG tablet Commonly known as: CATAPRES Take 1 tablet (0.2 mg total) by mouth 3 (three) times daily.   furosemide 40 MG tablet Commonly known as: LASIX Take by mouth.   valsartan 320 MG tablet Commonly known  as: DIOVAN Take 1 tablet (320 mg total) by mouth daily.        Allergies:  Allergies  Allergen Reactions   Hydralazine Other (See Comments)    Dizziness   Morphine And Codeine Palpitations    Heart beat fast    Nicoderm [Nicotine] Rash    Rash from the patch     Family History: Family History  Problem Relation Age of Onset   Diabetes Maternal Uncle    Heart disease Maternal Uncle    Diabetes Maternal Grandmother    Hypertension Maternal Grandmother    Miscarriages / Stillbirths Maternal Grandfather     Social History:  reports that he quit smoking about 7 years  ago. His smoking use included cigarettes. He started smoking about 26 years ago. He has a 4.6 pack-year smoking history. He has never used smokeless tobacco. He reports that he does not drink alcohol and does not use drugs.  ROS: All other review of systems were reviewed and are negative except what is noted above in HPI  Physical Exam: BP (!) 163/83   Pulse (!) 114   Constitutional:  Alert and oriented, No acute distress. HEENT: [place] AT, moist mucus membranes.  Trachea midline, no masses. Cardiovascular: No clubbing, cyanosis, or edema. Respiratory: Normal respiratory effort, no increased work of breathing. GI: Abdomen is soft, nontender, nondistended, no abdominal masses GU: No CVA tenderness. Uncircumcised phallus. Balanitis present. No masses/lesions on penis, testis, scrotum.  Lymph: No cervical or inguinal lymphadenopathy. Skin: No rashes, bruises or suspicious lesions. Neurologic: Grossly intact, no focal deficits, moving all 4 extremities. Psychiatric: Normal mood and affect.  Laboratory Data: Lab Results  Component Value Date   WBC 7.7 07/06/2022   HGB 14.4 07/06/2022   HCT 47.7 07/06/2022   MCV 90.5 07/06/2022   PLT 272 07/06/2022    Lab Results  Component Value Date   CREATININE 1.2 12/08/2022    No results found for: "PSA"  No results found for: "TESTOSTERONE"  Lab Results  Component Value Date   HGBA1C 10.3 12/08/2022    Urinalysis No results found for: "COLORURINE", "APPEARANCEUR", "LABSPEC", "PHURINE", "GLUCOSEU", "HGBUR", "BILIRUBINUR", "KETONESUR", "PROTEINUR", "UROBILINOGEN", "NITRITE", "LEUKOCYTESUR"  No results found for: "LABMICR", "WBCUA", "RBCUA", "LABEPIT", "MUCUS", "BACTERIA"  Pertinent Imaging:  No results found for this or any previous visit.  No results found for this or any previous visit.  No results found for this or any previous visit.  No results found for this or any previous visit.  No results found for this or any previous  visit.  No results found for this or any previous visit.  No results found for this or any previous visit.  No results found for this or any previous visit.   Assessment & Plan:    1. Encounter for circumcision (Primary) The risks/benefits/alternatives to circumcision was explained to the patient and he understands and does not wish to proceed with surgery at this time - Urinalysis, Routine w reflex microscopic  2. Balanitis -clotrimazole BID for 21 days   No follow-ups on file.  Wilkie Aye, MD  [facility] [place]

## 2023-11-12 ENCOUNTER — Encounter: Payer: Self-pay | Admitting: Urology

## 2023-11-12 NOTE — Patient Instructions (Signed)
 Balanitis  Balanitis is swelling and irritation of the head of the penis (glans penis). Balanitis occurs most often among males who have not had their foreskin removed (uncircumcised). In uncircumcised males, the condition may also cause inflammation of the skin around the foreskin. Balanitis sometimes causes scarring of the penis or foreskin, which can require surgery. This condition may develop because of an infection or another medical condition. Untreated balanitis can increase the risk of penile cancer. What are the causes? Common causes of this condition include: Irritation and lack of airflow due to fluid (smegma) that can build up on the glans penis. Poor personal hygiene, especially in uncircumcised males. Not cleaning the glans penis and foreskin well can result in a buildup of bacteria, viruses, and yeast, which can lead to infection and inflammation. Other causes include: Chemical irritation from products such as soaps or shower gels, especially those that have fragrance. Chemical irritation can also be caused by condoms, personal lubricants, petroleum jelly, spermicides, fabric softeners, or laundry detergents. Skin conditions, such as eczema, dermatitis, and psoriasis. Allergies to medicines, such as tetracycline and sulfa drugs. What increases the risk? The following factors may make you more likely to develop this condition: Being an uncircumcised male. Having diabetes. Having other medical conditions, including liver cirrhosis, congestive heart failure, or kidney disease. Having infections, such as candidiasis, HPV (human papillomavirus), herpes simplex, gonorrhea, or syphilis. Having a tight foreskin that is difficult to pull back (retract) past the glans penis. Being severely obese. History of reactive arthritis. What are the signs or symptoms? Symptoms of this condition include: Discharge from under the foreskin, and pain or difficulty retracting the foreskin. A bad smell  or itchiness on the penis. Tenderness, redness, and swelling of the glans penis. A rash or sores on the glans penis or foreskin. Inability to get an erection due to pain. Trouble urinating. Scarring of the penis or foreskin, in some cases. How is this diagnosed? This condition may be diagnosed based on a physical exam and tests of a swab of discharge to check for bacterial or fungal infection. You may also have blood tests to check for: Viruses that can cause balanitis. A high blood sugar (glucose) level. This could be a sign of diabetes, which can increase the risk of balanitis. How is this treated? Treatment for this condition depends on the cause. Treatment may include: Improving personal hygiene. Your health care provider may recommend sitting in a bath of warm water that is deep enough to cover your hips and buttocks (sitz bath). Medicines such as: Creams or ointments to reduce swelling (steroids) or to treat an infection. Antibiotic medicine. Antifungal medicine. Having surgery to remove or cut the foreskin (circumcision). This may be done if you have scarring on the foreskin that makes it difficult to retract. Controlling other medical problems that may be causing your condition or making it worse. Follow these instructions at home: Medicines Take over-the-counter and prescription medicines only as told by your health care provider. If you were prescribed an antibiotic medicine, use it as told by your health care provider. Do not stop using the antibiotic even if you start to feel better. General instructions Do not have sex until the condition clears up, or until your health care provider approves. Keep your penis clean and dry. Take sitz baths as recommended by your health care provider. Avoid products that irritate your skin or make symptoms worse, such as soaps and shower gels that have fragrance. Keep all follow-up visits. This is  important. Contact a health care provider  if: Your symptoms get worse or do not improve with home care. You develop chills or a fever. You have trouble urinating. You cannot retract your foreskin. Get help right away if: You develop severe pain. You are unable to urinate. Summary Balanitis is swelling and irritation of the head of the penis (glans penis). This condition is most common among uncircumcised males. Balanitis causes pain, redness, and swelling of the glans penis. Good personal hygiene is important. Treatment may include improving personal hygiene and applying creams or ointments. Contact a health care provider if your symptoms get worse or do not improve with home care. This information is not intended to replace advice given to you by your health care provider. Make sure you discuss any questions you have with your health care provider. Document Revised: 03/12/2021 Document Reviewed: 03/12/2021 Elsevier Patient Education  2024 [employer].

## 2023-11-15 ENCOUNTER — Ambulatory Visit: Payer: Medicaid Other | Admitting: Nurse Practitioner

## 2023-11-15 NOTE — Progress Notes (Deleted)
 Cardiology Office Note:  .   Date:  06/02/2023 ID:  Ross Schmidt, DOB 02-24-1986, MRN [id number] PCP: Benetta Spar, MD  [facility] Providers Cardiologist:  Chrystie Nose, MD    History of Present Illness: .   Ross Schmidt is a 38 y.o. 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  TTE 11/2019 showed improved EF at 60-65%, moderate LVH.   Last seen by Dr. Rennis Golden on December 02, 2021. BP markedly elevated in office. Was out of Entresto.   Admitted 06/2022 for acute right MCA, underwent TEE - see results below. Was felt his stroke was d/t uncontrolled HTN.  I saw him for follow-up on April 09, 2023. SBP averaging 150-190's. Never took Entresto. Denied any chest pain, shortness of breath, palpitations, syncope, presyncope, dizziness, orthopnea, PND, swelling or significant weight changes, acute bleeding, or claudication. Had noticed weight gain since last year. Needed assistance with getting his BiPAP. Denied any excessive salt intake, tobacco use, alcohol use, or illicit drug use.   Saw advanced HTN clinic on May 13, 2023.   Presents today for follow-up. BP has improved significantly since I last saw patient. Compliant with his medications. Overall he is doing well. Still hasn't heard anything regarding this BiPAP. Denies any chest pain, shortness of breath, palpitations, syncope, presyncope, dizziness, orthopnea, PND, swelling or significant weight changes, acute bleeding, or claudication.  Studies Reviewed: Marland Kitchen       EKG is not ordered today.   TEE Echo 07/2022:  1. Left ventricular ejection fraction, by estimation, is 50 to 55%. The  left ventricle has low normal function. There is severe left ventricular  hypertrophy.   2. Right ventricular systolic function is  normal. The right ventricular  size is mildly enlarged.   3. Left atrial size was mildly dilated. No left atrial/left atrial  appendage thrombus was detected.   4. Right atrial size was moderately dilated.   5. The mitral valve is grossly normal. Trivial mitral valve  regurgitation.   6. The aortic valve is tricuspid. Aortic valve regurgitation is not  visualized.   7. Evidence of atrial level shunting detected by color flow Doppler.  Agitated saline contrast bubble study was positive with shunting observed  within 3-6 cardiac cycles suggestive of interatrial shunt. There is a  small patent foramen ovale with  bidirectional shunting across atrial septum.   Conclusion(s)/Recommendation(s): Findings are concerning for an  interatrial shunt as detailed above. Case discussed with Dr. Excell Seltzer for  possible PFO closure - will confer with stroke neurology colleagues.  LHC 05/2017:  Mid LAD lesion, 25 %stenosed. There is moderate left ventricular systolic dysfunction. The left ventricular ejection fraction is 35-45% by visual estimate. LV end diastolic pressure is moderately elevated. LVEDP 29 mm Hg. There is no mitral valve regurgitation. There is no aortic valve stenosis.   Non-obstructive CAD.  He needs aggressive risk factor modification including weight loss.     Nonischemic cardiomyopathy.   Physical Exam:   VS:  There were no vitals taken for this visit.   Wt Readings from Last 3 Encounters:  06/16/23 (!) 359 lb (162.8 kg)  06/02/23 (!) 360 lb 6.4 oz (163.5 kg)  04/09/23 (!) 365 lb 6.4 oz (165.7 kg)    GEN: Morbidly obese, 38 y.o. male in no acute distress NECK: No JVD; No  carotid bruits CARDIAC: S1/S2, RRR, no murmurs, rubs, gallops RESPIRATORY:  Clear to auscultation without rales, wheezing or rhonchi  ABDOMEN: Soft, non-tender, non-distended EXTREMITIES:  Nonpitting edema to BLE; No deformity   ASSESSMENT AND PLAN: .    Hx of resistant HTN Longstanding history of  poorly controlled blood pressure. BP improved today at 138/80.  Now will work on SBP < 130. Continue current medication regimen.  Could not tolerate hydralazine -have previously added this to his allergy list.  Previously given BP log, salty 6.  No RAS seen on imaging. Heart healthy diet and regular cardiovascular exercise encouraged. ED precautions discussed. Will obtain CMET.   HFimpEF, medication management Stage C, NYHA class I-II symptoms.  NICM.  Felt likely due to uncontrolled hypertension.  TEE 07/2022 showed EF 50 to 55%. Weight is stable per his report.  Low sodium diet, fluid restriction <2L, and daily weights encouraged. Educated to contact our office for weight gain of 2 lbs overnight or 5 lbs in one week.Will continue current medication regimen. He is due for labs: Will obtain CBC and CMET.   Nonobstructive CAD, HLD, medication management Left heart cath in 2018 revealed nonobstructive CAD.  LDL 9/23 76.  Continue current medication regimen. Will obtain FLP and CMET. Heart healthy diet and regular cardiovascular exercise encouraged.   COPD, OSA, pulmonary HTN OSA is not treated, waiting on BiPAP.  Denies any worsening symptoms.  Continue to follow-up with pulmonology. Will contact pulmonologist for assistance with this.   Morbid obesity  Weight loss via diet and exercise encouraged. Discussed the impact being overweight would have on cardiovascular risk.  6. Hx of CVA Denies any new or worsening symptoms. No medication changes at this time - our office is not managing his medications for this. Continue to follow with PCP. Heart healthy diet and regular cardiovascular exercise encouraged.   Dispo: Will refill Plavix per his request. Follow-up with me or APP in 3-4 months or sooner if anything changes.   Signed, Sharlene Dory, NP

## 2023-11-22 ENCOUNTER — Ambulatory Visit: Payer: Medicaid Other | Admitting: Pulmonary Disease

## 2023-12-14 ENCOUNTER — Ambulatory Visit: Payer: Medicaid Other | Admitting: Nurse Practitioner

## 2023-12-14 DIAGNOSIS — E782 Mixed hyperlipidemia: Secondary | ICD-10-CM

## 2023-12-14 DIAGNOSIS — I10 Essential (primary) hypertension: Secondary | ICD-10-CM

## 2023-12-14 DIAGNOSIS — E1165 Type 2 diabetes mellitus with hyperglycemia: Secondary | ICD-10-CM

## 2023-12-14 DIAGNOSIS — Z7984 Long term (current) use of oral hypoglycemic drugs: Secondary | ICD-10-CM

## 2023-12-14 DIAGNOSIS — Z794 Long term (current) use of insulin: Secondary | ICD-10-CM

## 2023-12-14 NOTE — Progress Notes (Deleted)
 Endocrinology Consult Note       12/14/2023, 7:32 AM   Subjective:    Patient ID: Ross Schmidt, male    DOB: [birth year].  Ross Schmidt is being seen in consultation for management of currently uncontrolled symptomatic diabetes requested by  Benetta Spar, MD.   Past Medical History:  Diagnosis Date   Asthma    CHF (congestive heart failure) (HCC)    COPD (chronic obstructive pulmonary disease) (HCC)    CVA (cerebral vascular accident) (HCC) 07/06/2022   Diabetes mellitus without complication (HCC)    HLD (hyperlipidemia) 08/11/2016   Hypertension    Obesity    Oxygen deficiency    Premature baby    Sleep apnea     Past Surgical History:  Procedure Laterality Date   BUBBLE STUDY  07/23/2022   Procedure: BUBBLE STUDY;  Surgeon: Chrystie Nose, MD;  Location: [facility] END[facility];  Service: Cardiovascular;;   LEFT HEART CATH AND CORONARY ANGIOGRAPHY N/A 06/01/2017   Procedure: LEFT HEART CATH AND CORONARY ANGIOGRAPHY;  Surgeon: Corky Crafts, MD;  Location: [facility] INVASIVE CV LAB;  Service: Cardiovascular;  Laterality: N/A;   TEE WITHOUT CARDIOVERSION N/A 07/23/2022   Procedure: TRANSESOPHAGEAL ECHOCARDIOGRAM (TEE);  Surgeon: Chrystie Nose, MD;  Location: [facility] ENDOSCOPY;  Service: Cardiovascular;  Laterality: N/A;   TONSILLECTOMY      Social History   Socioeconomic History   Marital status: Single    Spouse name: Not on file   Number of children: 0   Years of education: 10   Highest education level: Not on file  Occupational History    Comment: disability  Tobacco Use   Smoking status: Former    Current packs/day: 0.00    Average packs/day: 0.3 packs/day for 18.3 years (4.6 ttl pk-yrs)    Types: Cigarettes    Start date: 10/12/1997    Quit date: 02/10/2016    Years since quitting: 7.8   Smokeless tobacco: Never  Vaping Use   Vaping status: Never Used  Substance and Sexual  Activity   Alcohol use: No   Drug use: No    Comment: hx of marijuana use, quit recently   Sexual activity: Not Currently  Other Topics Concern   Not on file  Social History Narrative   Lives with mother and step father   disabled   Social Drivers of Corporate investment banker Strain: Not on file  Food Insecurity: No Food Insecurity (07/06/2022)   Hunger Vital Sign    Worried About Running Out of Food in the Last Year: Never true    Ran Out of Food in the Last Year: Never true  Transportation Needs: Unmet Transportation Needs (05/17/2023)   PRAPARE - Administrator, Civil Service (Medical): Yes    Lack of Transportation (Non-Medical): No  Physical Activity: Not on file  Stress: Not on file  Social Connections: Not on file    Family History  Problem Relation Age of Onset   Diabetes Maternal Uncle    Heart disease Maternal Uncle    Diabetes Maternal Grandmother    Hypertension Maternal Grandmother    Miscarriages / Stillbirths Maternal Grandfather  Outpatient Encounter Medications as of 12/14/2023  Medication Sig   albuterol (VENTOLIN HFA) 108 (90 Base) MCG/ACT inhaler Inhale into the lungs.   allopurinol (ZYLOPRIM) 300 MG tablet Take 300 mg by mouth daily.   amLODipine (NORVASC) 10 MG tablet Take 1 tablet (10 mg total) by mouth daily.   atorvastatin (LIPITOR) 40 MG tablet TAKE 1 TABLET BY MOUTH DAILY   blood glucose meter kit and supplies KIT Dispense based on patient and insurance preference. Use up to four times daily as directed. (FOR ICD-9 250.00, 250.01).   carvedilol (COREG) 25 MG tablet TAKE ONE TABLET BY MOUTH TWICE DAILY WITH FOOD   cloNIDine (CATAPRES) 0.2 MG tablet Take 1 tablet (0.2 mg total) by mouth 3 (three) times daily.   clotrimazole-betamethasone (LOTRISONE) cream Apply 1 Application topically 2 (two) times daily.   furosemide (LASIX) 40 MG tablet Take by mouth.   valsartan (DIOVAN) 320 MG tablet Take 1 tablet (320 mg total) by mouth daily.    No facility-administered encounter medications on file as of 12/14/2023.    ALLERGIES: Allergies  Allergen Reactions   Hydralazine Other (See Comments)    Dizziness   Morphine And Codeine Palpitations    Heart beat fast    Nicoderm [Nicotine] Rash    Rash from the patch     VACCINATION STATUS: Immunization History  Administered Date(s) Administered   H1N1 07/26/2017   Influenza Whole 08/17/2022   Influenza,inj,Quad PF,6+ Mos 08/25/2016   Pneumococcal Conjugate-13 08/25/2016    Diabetes He presents for his initial diabetic visit. He has type 2 diabetes mellitus. His disease course has been fluctuating. There are no hypoglycemic associated symptoms. Associated symptoms include fatigue and polyuria. There are no hypoglycemic complications. Symptoms are stable. Diabetic complications include a CVA, heart disease (CHF) and nephropathy. Risk factors for coronary artery disease include diabetes mellitus, dyslipidemia, family history, obesity, male sex, hypertension and sedentary lifestyle. Current diabetic treatment includes insulin injections and oral agent (monotherapy). He is compliant with treatment most of the time. His weight is fluctuating minimally. He is following a generally unhealthy diet. When asked about meal planning, he reported none. He has not had a previous visit with a dietitian. He rarely participates in exercise. An ACE inhibitor/angiotensin II receptor blocker is being taken. He does not see a podiatrist.Eye exam is current.     Review of systems  Constitutional: + Minimally fluctuating body weight, current There is no height or weight on file to calculate BMI., no fatigue, no subjective hyperthermia, no subjective hypothermia Eyes: no blurry vision, no xerophthalmia ENT: no sore throat, no nodules palpated in throat, no dysphagia/odynophagia, no hoarseness Cardiovascular: no chest pain, no shortness of breath, no palpitations, no leg swelling Respiratory: no cough,  no shortness of breath Gastrointestinal: no nausea/vomiting/diarrhea Musculoskeletal: no muscle/joint aches Skin: no rashes, no hyperemia Neurological: no tremors, no numbness, no tingling, no dizziness Psychiatric: no depression, no anxiety  Objective:     There were no vitals taken for this visit.  Wt Readings from Last 3 Encounters:  06/16/23 (!) 359 lb (162.8 kg)  06/02/23 (!) 360 lb 6.4 oz (163.5 kg)  04/09/23 (!) 365 lb 6.4 oz (165.7 kg)     BP Readings from Last 3 Encounters:  11/05/23 (!) 163/83  06/16/23 (!) 156/92  06/02/23 138/80     Physical Exam- Limited  Constitutional:  There is no height or weight on file to calculate BMI. , not in acute distress, normal state of mind Eyes:  EOMI, no exophthalmos  Neck: Supple Cardiovascular: RRR, no murmurs, rubs, or gallops, no edema Respiratory: Adequate breathing efforts, no crackles, rales, rhonchi, or wheezing Musculoskeletal: no gross deformities, strength intact in all four extremities, no gross restriction of joint movements Skin:  no rashes, no hyperemia Neurological: no tremor with outstretched hands   Diabetic Foot Exam - Simple   No data filed      CMP ( most recent) CMP     Component Value Date/Time   NA 140 07/06/2022 1048   K 4.1 07/06/2022 1048   CL 103 07/06/2022 1048   CO2 31 07/06/2022 1048   GLUCOSE 169 (H) 07/06/2022 1048   BUN 17 12/08/2022 0000   CREATININE 1.2 12/08/2022 0000   CREATININE 1.12 07/06/2022 1048   CREATININE 0.89 12/15/2016 1602   CALCIUM 8.6 (L) 07/06/2022 1048   PROT 7.7 07/06/2022 1048   ALBUMIN 3.9 07/06/2022 1048   AST 19 07/06/2022 1048   ALT 20 07/06/2022 1048   ALKPHOS 69 07/06/2022 1048   BILITOT 0.9 07/06/2022 1048   EGFR 82 12/08/2022 0000   GFRNONAA >60 07/06/2022 1048   GFRNONAA >89 12/15/2016 1602     Diabetic Labs (most recent): Lab Results  Component Value Date   HGBA1C 10.3 12/08/2022   HGBA1C 8.2 (H) 07/07/2022   HGBA1C 8.3 (H) 07/06/2022    MICROALBUR >600.0 12/15/2016     Lipid Panel ( most recent) Lipid Panel     Component Value Date/Time   CHOL 137 07/07/2022 0407   TRIG 195 (A) 12/08/2022 0000   HDL 27 (L) 07/07/2022 0407   CHOLHDL 5.1 07/07/2022 0407   VLDL 34 07/07/2022 0407   LDLCALC 207 12/08/2022 0000      Lab Results  Component Value Date   TSH 1.86 12/15/2016           Assessment & Plan:   1) Type 2 diabetes mellitus with hyperglycemia, with long-term current use of insulin (HCC) (Primary)    - Ross Schmidt has currently uncontrolled symptomatic type 2 DM since *** years of age, with most recent A1c of *** %.   -Recent labs reviewed.  - I had a long discussion with him about the progressive nature of diabetes and the pathology behind its complications. -his diabetes is complicated by CHF, mild CKD, CVA and he remains at a high risk for more acute and chronic complications which include CAD, CVA, CKD, retinopathy, and neuropathy. These are all discussed in detail with him.  The following Lifestyle Medicine recommendations according to American College of Lifestyle Medicine [facility]) were discussed and offered to patient and he agrees to start the journey:  A. Whole Foods, Plant-based plate comprising of fruits and vegetables, plant-based proteins, whole-grain carbohydrates was discussed in detail with the patient.   A list for source of those nutrients were also provided to the patient.  Patient will use only water or unsweetened tea for hydration. B.  The need to stay away from risky substances including alcohol, smoking; obtaining 7 to 9 hours of restorative sleep, at least 150 minutes of moderate intensity exercise weekly, the importance of healthy social connections,  and stress reduction techniques were discussed. C.  A full color page of  Calorie density of various food groups per pound showing examples of each food groups was provided to the patient.  - I have counseled him on diet and  weight management by adopting a carbohydrate restricted/protein rich diet. Patient is encouraged to switch to unprocessed or minimally processed complex starch and  increased protein intake (animal or plant source), fruits, and vegetables. -  he is advised to stick to a routine mealtimes to eat 3 meals a day and avoid unnecessary snacks (to snack only to correct hypoglycemia).   - he acknowledges that there is a room for improvement in his food and drink choices. - Suggestion is made for him to avoid simple carbohydrates from his diet including Cakes, Sweet Desserts, Ice Cream, Soda (diet and regular), Sweet Tea, Candies, Chips, Cookies, Store Bought Juices, Alcohol in Excess of 1-2 drinks a day, Artificial Sweeteners, Coffee Creamer, and "Sugar-free" Products. This will help patient to have more stable blood glucose profile and potentially avoid unintended weight gain.  - I have approached him with the following individualized plan to manage his diabetes and patient agrees:    -he is encouraged to start/continue monitoring glucose 4 times daily, before meals and before bed, to log their readings on the clinic sheets provided, and bring them to review at follow up appointment in 3 months.  - he is warned not to take insulin without proper monitoring per orders. - Adjustment parameters are given to him for hypo and hyperglycemia in writing. - he is encouraged to call clinic for blood glucose levels less than 70 or above 300 mg /dl. - he is advised to continue ***, therapeutically suitable for patient . - his *** will be discontinued, risk outweighs benefit for this patient.  - he will be considered for incretin therapy as appropriate next visit.  - Specific targets for  A1c; LDL, HDL, and Triglycerides were discussed with the patient.  2) Blood Pressure /Hypertension:  his blood pressure is controlled to target.   he is advised to continue his current medications as prescribed by his PCP.  3)  Lipids/Hyperlipidemia:    Review of his recent lipid panel from 12/08/22 showed uncontrolled LDL at 207 and elevated triglycerides of 195.  he is advised to continue Atorvastatin 40 mg daily at bedtime.  Side effects and precautions discussed with him.  4)  Weight/Diet:  his There is no height or weight on file to calculate BMI.  -  clearly complicating his diabetes care.   he is a candidate for weight loss. I discussed with him the fact that loss of 5 - 10% of his  current body weight will have the most impact on his diabetes management.  Exercise, and detailed carbohydrates information provided  -  detailed on discharge instructions.  5) Chronic Care/Health Maintenance: -he is on ACEI/ARB and Statin medications and is encouraged to initiate and continue to follow up with Ophthalmology, Dentist, Podiatrist at least yearly or according to recommendations, and advised to *** stay away from smoking. I have recommended yearly flu vaccine and pneumonia vaccine at least every 5 years; moderate intensity exercise for up to 150 minutes weekly; and sleep for at least 7 hours a day.  - he is advised to maintain close follow up with Benetta Spar, MD for primary care needs, as well as his other providers for optimal and coordinated care.   - Time spent in this patient care: 60 min, of which > 50% was spent in counseling him about his diabetes and the rest reviewing his blood glucose logs, discussing his hypoglycemia and hyperglycemia episodes, reviewing his current and previous labs/studies (including abstraction from other facilities) and medications doses and developing a long term treatment plan based on the latest standards of care/guidelines; and documenting his care.    Please refer to  Patient Instructions for Blood Glucose Monitoring and Insulin/Medications Dosing Guide" in media tab for additional information. Please also refer to "Patient Self Inventory" in the Media tab for reviewed elements  of pertinent patient history.  Ross Schmidt participated in the discussions, expressed understanding, and voiced agreement with the above plans.  All questions were answered to his satisfaction. he is encouraged to contact clinic should he have any questions or concerns prior to his return visit.     Follow up plan: - No follow-ups on file.    Ronny Bacon, [facility] [address] [place], [place] [address] Phone: [phone] Fax: [phone]  12/14/2023, 7:32 AM

## 2023-12-31 ENCOUNTER — Ambulatory Visit: Payer: Medicaid Other | Admitting: Urology

## 2023-12-31 VITALS — BP 143/91 | HR 91

## 2023-12-31 DIAGNOSIS — N481 Balanitis: Secondary | ICD-10-CM

## 2023-12-31 MED ORDER — CLOTRIMAZOLE-BETAMETHASONE 1-0.05 % EX CREA: 1.0000 | TOPICAL_CREAM | Freq: Two times a day (BID) | CUTANEOUS | 3 refills | Status: DC

## 2023-12-31 NOTE — Progress Notes (Signed)
 12/31/2023 11:08 AM   Ross Schmidt Mar 03, 1986 [id number]  Referring provider: Benetta Spar, MD [address] [place],  [place] [address]  balanitis   HPI: [name] is a 38yo here for followup for balanitis. He has been applying clotrimazole BID for 6 weeks. He notes the foreskin is less tense but he still connot retract his foreskin.    PMH: Past Medical History:  Diagnosis Date   Asthma    CHF (congestive heart failure) (HCC)    COPD (chronic obstructive pulmonary disease) (HCC)    CVA (cerebral vascular accident) (HCC) 07/06/2022   Diabetes mellitus without complication (HCC)    HLD (hyperlipidemia) 08/11/2016   Hypertension    Obesity    Oxygen deficiency    Premature baby    Sleep apnea     Surgical History: Past Surgical History:  Procedure Laterality Date   BUBBLE STUDY  07/23/2022   Procedure: BUBBLE STUDY;  Surgeon: Chrystie Nose, MD;  Location: [facility] ENDOSCOPY;  Service: Cardiovascular;;   LEFT HEART CATH AND CORONARY ANGIOGRAPHY N/A 06/01/2017   Procedure: LEFT HEART CATH AND CORONARY ANGIOGRAPHY;  Surgeon: Corky Crafts, MD;  Location: [facility] INVASIVE CV LAB;  Service: Cardiovascular;  Laterality: N/A;   TEE WITHOUT CARDIOVERSION N/A 07/23/2022   Procedure: TRANSESOPHAGEAL ECHOCARDIOGRAM (TEE);  Surgeon: Chrystie Nose, MD;  Location: [facility] ENDOSCOPY;  Service: Cardiovascular;  Laterality: N/A;   TONSILLECTOMY      Home Medications:  Allergies as of 12/31/2023       Reactions   Hydralazine Other (See Comments)   Dizziness   Morphine And Codeine Palpitations   Heart beat fast    Nicoderm [nicotine] Rash   Rash from the patch         Medication List        Accurate as of December 31, 2023 11:08 AM. If you have any questions, ask your nurse or doctor.          albuterol 108 (90 Base) MCG/ACT inhaler Commonly known as: VENTOLIN HFA Inhale into the lungs.   allopurinol 300 MG tablet Commonly known as:  ZYLOPRIM Take 300 mg by mouth daily.   amLODipine 10 MG tablet Commonly known as: NORVASC Take 1 tablet (10 mg total) by mouth daily.   atorvastatin 40 MG tablet Commonly known as: LIPITOR TAKE 1 TABLET BY MOUTH DAILY   blood glucose meter kit and supplies Kit Dispense based on patient and insurance preference. Use up to four times daily as directed. (FOR ICD-9 250.00, 250.01).   carvedilol 25 MG tablet Commonly known as: COREG TAKE ONE TABLET BY MOUTH TWICE DAILY WITH FOOD   cloNIDine 0.2 MG tablet Commonly known as: CATAPRES Take 1 tablet (0.2 mg total) by mouth 3 (three) times daily.   clotrimazole-betamethasone cream Commonly known as: LOTRISONE Apply 1 Application topically 2 (two) times daily.   furosemide 40 MG tablet Commonly known as: LASIX Take by mouth.   valsartan 320 MG tablet Commonly known as: DIOVAN Take 1 tablet (320 mg total) by mouth daily.        Allergies:  Allergies  Allergen Reactions   Hydralazine Other (See Comments)    Dizziness   Morphine And Codeine Palpitations    Heart beat fast    Nicoderm [Nicotine] Rash    Rash from the patch     Family History: Family History  Problem Relation Age of Onset   Diabetes Maternal Uncle    Heart disease Maternal Uncle  Diabetes Maternal Grandmother    Hypertension Maternal Grandmother    Miscarriages / Stillbirths Maternal Grandfather     Social History:  reports that he quit smoking about 7 years ago. His smoking use included cigarettes. He started smoking about 26 years ago. He has a 4.6 pack-year smoking history. He has never used smokeless tobacco. He reports that he does not drink alcohol and does not use drugs.  ROS: All other review of systems were reviewed and are negative except what is noted above in HPI  Physical Exam: BP (!) 143/91   Pulse 91   Constitutional:  Alert and oriented, No acute distress. HEENT: [place] AT, moist mucus membranes.  Trachea midline, no  masses. Cardiovascular: No clubbing, cyanosis, or edema. Respiratory: Normal respiratory effort, no increased work of breathing. GI: Abdomen is soft, nontender, nondistended, no abdominal masses GU: No CVA tenderness.  Lymph: No cervical or inguinal lymphadenopathy. Skin: No rashes, bruises or suspicious lesions. Neurologic: Grossly intact, no focal deficits, moving all 4 extremities. Psychiatric: Normal mood and affect.  Laboratory Data: Lab Results  Component Value Date   WBC 7.7 07/06/2022   HGB 14.4 07/06/2022   HCT 47.7 07/06/2022   MCV 90.5 07/06/2022   PLT 272 07/06/2022    Lab Results  Component Value Date   CREATININE 1.2 12/08/2022    No results found for: "PSA"  No results found for: "TESTOSTERONE"  Lab Results  Component Value Date   HGBA1C 10.3 12/08/2022    Urinalysis No results found for: "COLORURINE", "APPEARANCEUR", "LABSPEC", "PHURINE", "GLUCOSEU", "HGBUR", "BILIRUBINUR", "KETONESUR", "PROTEINUR", "UROBILINOGEN", "NITRITE", "LEUKOCYTESUR"  No results found for: "LABMICR", "WBCUA", "RBCUA", "LABEPIT", "MUCUS", "BACTERIA"  Pertinent Imaging:  No results found for this or any previous visit.  No results found for this or any previous visit.  No results found for this or any previous visit.  No results found for this or any previous visit.  No results found for this or any previous visit.  No results found for this or any previous visit.  No results found for this or any previous visit.  No results found for this or any previous visit.   Assessment & Plan:    1. Balanitis (Primary) Continue clotrimazole BID   No follow-ups on file.  Wilkie Aye, MD  [facility] [place]

## 2024-01-04 ENCOUNTER — Ambulatory Visit: Payer: Medicaid Other | Attending: Nurse Practitioner | Admitting: Nurse Practitioner

## 2024-01-04 ENCOUNTER — Encounter: Payer: Self-pay | Admitting: Nurse Practitioner

## 2024-01-04 VITALS — BP 142/88 | HR 88 | Ht 69.0 in | Wt 341.6 lb

## 2024-01-04 DIAGNOSIS — I5032 Chronic diastolic (congestive) heart failure: Secondary | ICD-10-CM | POA: Diagnosis not present

## 2024-01-04 DIAGNOSIS — E785 Hyperlipidemia, unspecified: Secondary | ICD-10-CM

## 2024-01-04 DIAGNOSIS — I272 Pulmonary hypertension, unspecified: Secondary | ICD-10-CM

## 2024-01-04 DIAGNOSIS — J449 Chronic obstructive pulmonary disease, unspecified: Secondary | ICD-10-CM

## 2024-01-04 DIAGNOSIS — Z8673 Personal history of transient ischemic attack (TIA), and cerebral infarction without residual deficits: Secondary | ICD-10-CM

## 2024-01-04 DIAGNOSIS — I2510 Atherosclerotic heart disease of native coronary artery without angina pectoris: Secondary | ICD-10-CM | POA: Diagnosis not present

## 2024-01-04 DIAGNOSIS — I1A0 Resistant hypertension: Secondary | ICD-10-CM | POA: Diagnosis not present

## 2024-01-04 DIAGNOSIS — G4733 Obstructive sleep apnea (adult) (pediatric): Secondary | ICD-10-CM

## 2024-01-04 NOTE — Progress Notes (Signed)
 Cardiology Office Note:  .   Date:  01/04/2024 ID:  Ross Schmidt, DOB 1986/01/25, MRN [id number] PCP: Ross Spar, MD  [facility] Providers Cardiologist:  Ross Nose, MD    History of Present Illness: .   Ross Schmidt is a 38 y.o. 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  TTE 11/2019 showed improved EF at 60-65%, moderate LVH.   Last seen by Dr. Rennis Schmidt on December 02, 2021. BP markedly elevated in office. Was out of Entresto.   Admitted 06/2022 for acute right MCA, underwent TEE - see results below. Was felt his stroke was d/t uncontrolled HTN.  I saw him for follow-up on April 09, 2023. SBP averaging 150-190's. Never took Entresto. Denied any chest pain, shortness of breath, palpitations, syncope, presyncope, dizziness, orthopnea, PND, swelling or significant weight changes, acute bleeding, or claudication. Had noticed weight gain since last year. Needed assistance with getting his BiPAP. Denied any excessive salt intake, tobacco use, alcohol use, or illicit drug use.   Saw advanced HTN clinic on May 13, 2023.   06/02/2023 - Presents today for follow-up. BP has improved significantly since I last saw patient. Compliant with his medications. Overall he is doing well. Still hasn't heard anything regarding this BiPAP. Denies any chest pain, shortness of breath, palpitations, syncope, presyncope, dizziness, orthopnea, PND, swelling or significant weight changes, acute bleeding, or claudication.  01/04/2024 -presents today for follow-up.  Doing well.  Denies any acute cardiac complaints or issues.  He has lost almost 20 pounds since I last saw him in the office.  He tells me he is drinking more water.  He got his BiPAP machine and is compliant with this. Denies any chest  pain, shortness of breath, palpitations, syncope, presyncope, dizziness, orthopnea, PND, swelling, acute bleeding, or claudication.  ROS: Negative. See HPI.   Studies Reviewed: Marland Kitchen       EKG is not ordered today.   TEE Echo 07/2022:  1. Left ventricular ejection fraction, by estimation, is 50 to 55%. The  left ventricle has low normal function. There is severe left ventricular  hypertrophy.   2. Right ventricular systolic function is normal. The right ventricular  size is mildly enlarged.   3. Left atrial size was mildly dilated. No left atrial/left atrial  appendage thrombus was detected.   4. Right atrial size was moderately dilated.   5. The mitral valve is grossly normal. Trivial mitral valve  regurgitation.   6. The aortic valve is tricuspid. Aortic valve regurgitation is not  visualized.   7. Evidence of atrial level shunting detected by color flow Doppler.  Agitated saline contrast bubble study was positive with shunting observed  within 3-6 cardiac cycles suggestive of interatrial shunt. There is a  small patent foramen ovale with  bidirectional shunting across atrial septum.   Conclusion(s)/Recommendation(s): Findings are concerning for an  interatrial shunt as detailed above. Case discussed with Ross Schmidt for  possible PFO closure - will confer with stroke neurology colleagues.  LHC 05/2017:  Mid LAD lesion, 25 %stenosed. There is moderate left ventricular systolic dysfunction. The left ventricular ejection fraction is 35-45% by visual estimate. LV end diastolic pressure is moderately elevated. LVEDP 29 mm Hg. There is no mitral valve regurgitation. There is no aortic valve stenosis.   Non-obstructive CAD.  He needs aggressive  risk factor modification including weight loss.     Nonischemic cardiomyopathy.   Physical Exam:   VS:  BP (!) 142/88   Pulse 88   Ht 5\' 9"  (1.753 m)   Wt (!) 341 lb 9.6 oz (154.9 kg)   SpO2 92%   BMI 50.45 kg/m    Wt Readings from  Last 3 Encounters:  01/04/24 (!) 341 lb 9.6 oz (154.9 kg)  06/16/23 (!) 359 lb (162.8 kg)  06/02/23 (!) 360 lb 6.4 oz (163.5 kg)    GEN: Morbidly obese, 38 y.o. male in no acute distress NECK: No JVD; No carotid bruits CARDIAC: S1/S2, RRR, no murmurs, rubs, gallops RESPIRATORY:  Clear to auscultation without rales, wheezing or rhonchi  ABDOMEN: Soft, non-tender, non-distended EXTREMITIES:  Nonpitting edema to BLE; No deformity   ASSESSMENT AND PLAN: .    Hx of resistant HTN Longstanding history of poorly controlled blood pressure. SBP averaging 140's. Continue current medication regimen.  Could not tolerate hydralazine -have previously added this to his allergy list.  Previously given BP log, salty 6.  No RAS seen on imaging. Heart healthy diet and regular cardiovascular exercise encouraged. ED precautions discussed. Will obtain CMET as previously ordered.  If kidney function/labwork permits, will add hydrochlorothiazide/Aldactone to med list.   HFimpEF, medication management Stage C, NYHA class I-II symptoms.  NICM.  Felt likely due to uncontrolled hypertension.  TEE 07/2022 showed EF 50 to 55%. Weight is stable per his report.  Low sodium diet, fluid restriction <2L, and daily weights encouraged. Educated to contact our office for weight gain of 2 lbs overnight or 5 lbs in one week.Will continue current medication regimen. He is due for labs: Will obtain CBC and CMET.   Nonobstructive CAD, HLD, medication management Stable with no anginal symptoms. No indication for ischemic evaluation.  Left heart cath in 2018 revealed nonobstructive CAD.  LDL 9/23 76.  Continue current medication regimen. Will obtain FLP and CMET. Heart healthy diet and regular cardiovascular exercise encouraged.   COPD, OSA, pulmonary HTN Encouraged continued compliance with BiPAP. Denies any worsening symptoms.  Continue to follow-up with pulmonology.   Morbid obesity  Weight loss via diet and exercise encouraged.  Discussed the impact being overweight would have on cardiovascular risk. Will route note to clinical pharm D regarding Ozempic.   6. Hx of CVA Denies any new or worsening symptoms. No medication changes at this time - our office is not managing his medications for this. Continue to follow with PCP. Heart healthy diet and regular cardiovascular exercise encouraged.   Dispo:  Follow-up with Dr. Rennis Schmidt or APP in 6 months or sooner if anything changes.   Signed, Sharlene Dory, NP

## 2024-01-04 NOTE — Patient Instructions (Signed)
 Medication Instructions:  Your physician recommends that you continue on your current medications as directed. Please refer to the Current Medication list given to you today.   Labwork: CBC, CMET and fast lipid panel to be completed tomorrow at [facility] or [facility]  Testing/Procedures: None  Follow-Up: Your physician recommends that you schedule a follow-up appointment in: 6 months  Any Other Special Instructions Will Be Listed Below (If Applicable). Thank you for choosing [facility]!     If you need a refill on your cardiac medications before your next appointment, please call your pharmacy.

## 2024-01-06 ENCOUNTER — Encounter: Payer: Self-pay | Admitting: Urology

## 2024-01-09 ENCOUNTER — Encounter: Payer: Self-pay | Admitting: Nurse Practitioner

## 2024-01-10 ENCOUNTER — Telehealth: Payer: Self-pay

## 2024-01-10 NOTE — Telephone Encounter (Signed)
 Patient called to confirm surgery for circumcision, please add to surgery work que.  Surgery date of 05/01 tentatively given.

## 2024-01-18 ENCOUNTER — Other Ambulatory Visit: Payer: Self-pay | Admitting: Urology

## 2024-01-18 DIAGNOSIS — N481 Balanitis: Secondary | ICD-10-CM

## 2024-01-20 ENCOUNTER — Telehealth: Payer: Self-pay

## 2024-01-20 NOTE — Telephone Encounter (Signed)
 Pt has an appointment tomorrow with Buelah Manis, NP to re qualify for his drivers license. I called [employer] and spoke to [place]. Arlys John stated that the pt has 2 accounts with [employer], both with Bipap and pt has no compliance for either account.   I called and spoke to pt regarding this. Pt stated he last used his Bipap 2 days ago. I advised pt to bring in his machine so we could try and find a compliance report. Pt verbalized understanding. Nfn

## 2024-01-21 ENCOUNTER — Encounter: Payer: Self-pay | Admitting: Primary Care

## 2024-01-21 ENCOUNTER — Ambulatory Visit (INDEPENDENT_AMBULATORY_CARE_PROVIDER_SITE_OTHER): Admitting: Primary Care

## 2024-01-21 VITALS — BP 154/90 | HR 88 | Temp 97.8°F | Ht 69.0 in | Wt 336.8 lb

## 2024-01-21 DIAGNOSIS — G4733 Obstructive sleep apnea (adult) (pediatric): Secondary | ICD-10-CM | POA: Diagnosis not present

## 2024-01-21 DIAGNOSIS — I272 Pulmonary hypertension, unspecified: Secondary | ICD-10-CM

## 2024-01-21 NOTE — Progress Notes (Signed)
 @Patient  ID: Ross Schmidt, male    DOB: Mar 13, 1986, 38 y.o.   MRN: [id number]  Chief Complaint  Patient presents with   Follow-up    Re qualify for Drivers license      Referring provider: Wyvonna Heidelberg*  HPI: 37 year old male, former smoker. PMH significant for HTN, pulm HTN, cardiomyopathy, congestive heart failure, CVA, OSA on BiPAP, oxygen dependent, type 2 diabetes, hyperlipidemia, obesity. Patient of Dr. Gaynell Keeler.    Obstructive sleep apnea -Encouraged to get back to using BiPAP on a regular basis -Order was placed for a new BiPAP machine -He has not received his new machine yet from [employer]   History of systolic heart failure -Encourage regular follow-up to optimize treatment   History of pulmonary hypertension Risk of progression of disease with noncompliance    01/21/2024- Interim hx  Discussed the use of AI scribe software for clinical note transcription with the patient, who gave verbal consent to proceed.  History of Present Illness   Ross Schmidt is a 38 year old male with sleep apnea who presents for a driver's license medical reassessment.  He uses a BiPAP machine nightly for his sleep apnea and has been compliant with this therapy for the past month. However, he experiences discomfort, suspecting the pressure settings may be too high. Verification of BiPAP compliance is required for [facility] requirements, but no data was available on the SD card from his machine. He lacks a phone application to monitor usage.  He uses oxygen at home, primarily at night with his BiPAP, and as needed during the day if he experiences shortness of breath after exertion. He does not use oxygen continuously during the day.  He has a history of hypertension but reports no recent issues with his blood pressure. He did not take his medication on the day of the visit, resulting in a reading of 154/90. No significant shortness of breath or need for his rescue inhaler  recently.     Allergies  Allergen Reactions   Hydralazine Other (See Comments)    Dizziness   Morphine And Codeine Palpitations    Heart beat fast    Nicoderm [Nicotine] Rash    Rash from the patch     Immunization History  Administered Date(s) Administered   H1N1 07/26/2017   Influenza Whole 08/17/2022   Influenza,inj,Quad PF,6+ Mos 08/25/2016   Pneumococcal Conjugate-13 08/25/2016    Past Medical History:  Diagnosis Date   Asthma    CHF (congestive heart failure) (HCC)    COPD (chronic obstructive pulmonary disease) (HCC)    CVA (cerebral vascular accident) (HCC) 07/06/2022   Diabetes mellitus without complication (HCC)    HLD (hyperlipidemia) 08/11/2016   Hypertension    Obesity    Oxygen deficiency    Premature baby    Sleep apnea     Tobacco History: Social History   Tobacco Use  Smoking Status Former   Current packs/day: 0.00   Average packs/day: 0.3 packs/day for 18.3 years (4.6 ttl pk-yrs)   Types: Cigarettes   Start date: 10/12/1997   Quit date: 02/10/2016   Years since quitting: 7.9  Smokeless Tobacco Never   Counseling given: Not Answered   Outpatient Medications Prior to Visit  Medication Sig Dispense Refill   ACCU-CHEK GUIDE TEST test strip      allopurinol (ZYLOPRIM) 300 MG tablet Take 300 mg by mouth daily.     amLODipine (NORVASC) 10 MG tablet Take 1 tablet (10 mg total) by mouth  daily. 30 tablet 0   atorvastatin (LIPITOR) 40 MG tablet TAKE 1 TABLET BY MOUTH DAILY 90 tablet 1   blood glucose meter kit and supplies KIT Dispense based on patient and insurance preference. Use up to four times daily as directed. (FOR ICD-9 250.00, 250.01). 1 each 0   carvedilol (COREG) 25 MG tablet TAKE ONE TABLET BY MOUTH TWICE DAILY WITH FOOD 180 tablet 1   cloNIDine (CATAPRES) 0.2 MG tablet Take 1 tablet (0.2 mg total) by mouth 3 (three) times daily. 60 tablet 11   clopidogrel (PLAVIX) 75 MG tablet Take 75 mg by mouth daily.     clotrimazole-betamethasone  (LOTRISONE) cream Apply 1 Application topically 2 (two) times daily. 30 g 3   HYDROcodone-acetaminophen (NORCO/VICODIN) 5-325 MG tablet Take 1 tablet by mouth every 4 (four) hours as needed for moderate pain (pain score 4-6).     LANTUS SOLOSTAR 100 UNIT/ML Solostar Pen Inject 30 Units into the skin daily.     valsartan (DIOVAN) 320 MG tablet Take 1 tablet (320 mg total) by mouth daily. 90 tablet 2   albuterol (VENTOLIN HFA) 108 (90 Base) MCG/ACT inhaler Inhale into the lungs.     furosemide (LASIX) 40 MG tablet Take by mouth. (Patient not taking: Reported on 01/21/2024)     No facility-administered medications prior to visit.      Review of Systems  Review of Systems  Constitutional: Negative.  Negative for fever.  Respiratory: Negative.  Negative for shortness of breath.    Physical Exam  BP (!) 154/90 (BP Location: Right Arm, Patient Position: Sitting, Cuff Size: Large)   Pulse 88   Temp 97.8 F (36.6 C) (Temporal)   Ht 5\' 9"  (1.753 m)   Wt (!) 336 lb 12.8 oz (152.8 kg)   SpO2 95%   BMI 49.74 kg/m  Physical Exam Constitutional:      Appearance: Normal appearance. He is not ill-appearing.  HENT:     Head: Normocephalic and atraumatic.  Cardiovascular:     Rate and Rhythm: Normal rate and regular rhythm.  Pulmonary:     Effort: Pulmonary effort is normal.     Breath sounds: Normal breath sounds. No wheezing, rhonchi or rales.  Skin:    General: Skin is warm and dry.  Neurological:     General: No focal deficit present.     Mental Status: He is alert and oriented to person, place, and time. Mental status is at baseline.  Psychiatric:        Mood and Affect: Mood normal.        Behavior: Behavior normal.        Thought Content: Thought content normal.      Lab Results:  CBC    Component Value Date/Time   WBC 7.7 07/06/2022 1048   RBC 5.27 07/06/2022 1048   HGB 14.4 07/06/2022 1048   HCT 47.7 07/06/2022 1048   PLT 272 07/06/2022 1048   MCV 90.5 07/06/2022  1048   MCH 27.3 07/06/2022 1048   MCHC 30.2 07/06/2022 1048   RDW 14.6 07/06/2022 1048   LYMPHSABS 2.1 07/06/2022 1048   MONOABS 0.8 07/06/2022 1048   EOSABS 0.1 07/06/2022 1048   BASOSABS 0.1 07/06/2022 1048    BMET    Component Value Date/Time   NA 140 07/06/2022 1048   K 4.1 07/06/2022 1048   CL 103 07/06/2022 1048   CO2 31 07/06/2022 1048   GLUCOSE 169 (H) 07/06/2022 1048   BUN 17 12/08/2022 0000  CREATININE 1.2 12/08/2022 0000   CREATININE 1.12 07/06/2022 1048   CREATININE 0.89 12/15/2016 1602   CALCIUM 8.6 (L) 07/06/2022 1048   GFRNONAA >60 07/06/2022 1048   GFRNONAA >89 12/15/2016 1602   GFRAA >60 06/01/2017 1129   GFRAA >89 12/15/2016 1602    BNP No results found for: "BNP"  ProBNP No results found for: "PROBNP"  Imaging: No results found.   Assessment & Plan:   1. OSA (obstructive sleep apnea) (Primary)  2. Pulmonary HTN (HCC)   Assessment and Plan    Obstructive Sleep Apnea Patient reports compliance with BIPAP use nightly. BiPAP compliance and pressure settings need verification. No data available from SD card. BiPAP obtained through [employer]. - Advise patient to contact [employer] for AirView enrollment or bring BIPAP machine by DME for data download. - Review BiPAP pressure settings once data is available.  Oxygen Therapy Uses oxygen at night with BiPAP and as needed for dyspnea. - Perform walking test to assess daytime oxygen need   Pulm HTN - Continue to encourage compliance with CPAP and oxygen to prevent progression of diseasw   Hypertension Blood pressure elevated at 154/90 mmHg due to missed medication. - Advise to take antihypertensive medication and recheck blood pressure in one hour. - Instruct to monitor blood pressure regularly.  Follow-up for [facility] Review Needs BiPAP usage documentation for DMV to reinstate driving privileges. Received letter from [facility] for medical review due to hypertension and sleep disorder. - Provide  contact information for [employer]. - Advise to follow up with [employer] for BiPAP data download or AirView enrollment.       Antonio Baumgarten, NP 01/21/2024

## 2024-01-21 NOTE — Patient Instructions (Signed)
-  OBSTRUCTIVE SLEEP APNEA: Obstructive sleep apnea is a condition where your airway becomes blocked during sleep, causing breathing pauses. We need to verify your BiPAP compliance and pressure settings. Please contact [employer] to enroll in AirView or download your BiPAP data. Once we have the data, we will review your pressure settings.  -OXYGEN THERAPY: You use oxygen at night with your BiPAP machine and as needed during the day for shortness of breath. We will perform a walking test to assess your oxygen levels during activity.  -HYPERTENSION: Hypertension is high blood pressure. Your blood pressure was elevated today because you missed your medication. Please take your antihypertensive medication and recheck your blood pressure in one hour. Make sure to monitor your blood pressure regularly.  INSTRUCTIONS Please follow up with [employer] to download your BiPAP data or enroll in AirView. This documentation is needed for the [facility] to reinstate your driving privileges. Additionally, recheck your blood pressure one hour after taking your medication and monitor it regularly.  Follow-up 6 months with Dr. Wynona Neat

## 2024-01-26 ENCOUNTER — Telehealth: Payer: Self-pay | Admitting: Primary Care

## 2024-01-26 NOTE — Telephone Encounter (Signed)
 Fax received from Dr. Claretta Croft with [facility] to perform a circumcision on patient at [facility]  Patient needs surgery clearance. Surgery is 02/10/24 . Patient was seen on 01/21/24. Office protocol is a risk assessment can be sent to surgeon if patient has been seen in 60 days or less.   Sending to [facility] for risk assessment or recommendations if patient needs to be seen in office prior to surgical procedure.

## 2024-02-08 NOTE — Telephone Encounter (Signed)
 Patient will be considered moderate risk for prolonged mechanical ventilation and/or postop pulmonary complications due to history of obesity, obstructive sleep apnea, chronic respiratory failure and pulmonary hypertension.  Recommend surgery be done in inpatient setting.

## 2024-02-08 NOTE — Telephone Encounter (Signed)
 Routing to Ross Schmidt for surgical clearance as she handles this.

## 2024-02-08 NOTE — Telephone Encounter (Signed)
 I will route to [facility] as she handles surgical clearance, and Irby Mannan, NP to advise.

## 2024-02-08 NOTE — Telephone Encounter (Signed)
 courtney calling from dr Claretta Croft office following up on a pulmonology clearance for Ross Schmidt to view want to see if he will be cleared for anesthesia and the surgery is on may the 1st. Ms Ross Schmidt is requesting the office fax back the clearance asap .  Fax number [phone]

## 2024-02-08 NOTE — Patient Instructions (Signed)
 Ross Schmidt  02/08/2024     @PREFPERIOPPHARMACY @   Your procedure is scheduled on 5/1.  Report to [facility] at 0800 A.M.  Call this number if you have problems the morning of surgery:  [phone]  If you experience any cold or flu symptoms such as cough, fever, chills, shortness of breath, etc. between now and your scheduled surgery, please notify us  at the above number.   Remember:  Do not eat or drink after midnight.  You may drink clear liquids until 0600 .  Clear liquids allowed are:                    Water, Juice (No red color; non-citric and without pulp; diabetics please choose diet or no sugar options), Carbonated beverages (diabetics please choose diet or no sugar options), Clear Tea (No creamer, milk, or cream, including half & half and powdered creamer), and Black Coffee Only (No creamer, milk or cream, including half & half and powdered creamer)    Take these medicines the morning of surgery with A SIP OF WATER: albuterol, amlodipine , carvedilol , clonidine . Take 15 units (half your usual dose) of Lantus the night before procedure,    Do not wear jewelry, make-up or nail polish, including gel polish,  artificial nails, or any other type of covering on natural nails (fingers and  toes).  Do not wear lotions, powders, or perfumes, or deodorant.  Do not shave 48 hours prior to surgery.  Men may shave face and neck.  Do not bring valuables to the hospital.  [facility] is not responsible for any belongings or valuables.  Contacts, dentures or bridgework may not be worn into surgery.  Leave your suitcase in the car.  After surgery it may be brought to your room.  For patients admitted to the hospital, discharge time will be determined by your treatment team.  Patients discharged the day of surgery will not be allowed to drive home.   Name and phone number of your driver:   driver  Please read over the following fact sheets that you were given. Coughing and  Deep Breathing, Anesthesia Post-op Instructions, and Care and Recovery After Surgery      Circumcision, Adult, Care After After a circumcision, it is common for the area around your cut from surgery (incision) to have: Redness. Swelling. Soreness. Follow these instructions at home: Your doctor may give you more instructions. If you have problems, contact your doctor. Medicines Take or use over-the-counter and prescription medicines only as told by your doctor. If you were prescribed an antibiotic medicine, use it as told by your doctor. Do not stop using it even if you start to feel better. Bathing Do not get your incision area wet for 24 hours after the procedure, or as long as told by your doctor. Do not take baths, swim, or use a hot tub. Ask your doctor about taking showers or sponge baths. When you can shower, do not rub the incision area. Gently pat it dry. Incision care  Follow instructions from your doctor about how to take care of your incision. Make sure you: Wash your hands with soap and water for at least 20 seconds before and after you change your bandage (dressing). If you cannot use soap and water, use hand sanitizer. Change your dressing. Leave the stitches alone. They will disappear over time. Check your incision area every day for signs of infection. Check for: More redness, swelling, or pain. More fluid  or blood. Warmth. Pus or a bad smell. Managing pain and swelling If told, put ice on the painful area. To do this: Put ice in a plastic bag. Place a towel between your skin and the bag. Leave the ice on for 20 minutes, 2-3 times a day. Take off the ice if your skin turns bright red. This is very important. If you cannot feel pain, heat, or cold, you have a greater risk of damage to the area.  Activity  If you were given a sedative during your procedure, do not drive or use machines until your doctor says that it is safe. A sedative is a medicine that helps you  relax. You may have to avoid lifting. Ask your doctor how much you can safely lift. Do not have sex until your doctor says it is okay. Rest as told by your doctor. Return to your normal activities when your doctor says that it is safe. Avoid contact sports and biking until your doctor says it is okay. General instructions Do not smoke or use any products that contain nicotine or tobacco. These can make it take longer for your incision to heal. If you need help quitting, ask your doctor. Drink enough fluid to keep your pee (urine) pale yellow. Keep all follow-up visits. Contact a doctor if: You have a fever. Medicine does not help your pain. You have any of these signs of infection around your incision: More redness, swelling, or pain. More fluid or blood. Warmth. Pus or a bad smell. Your incision breaks open. Get help right away if: You cannot pee, or it hurts to pee. There is redness, swelling, and soreness that spreads up the shaft of your penis, your thighs, or your lower belly (abdomen). You have bleeding that does not stop when you press on it. Summary After the procedure, it is common to have redness, swelling, and soreness around your cut from surgery (incision). Follow instructions from your doctor about how to take care of your incision. Check your incision area every day for signs of infection. Do not have sex until your doctor says it is okay. This information is not intended to replace advice given to you by your health care provider. Make sure you discuss any questions you have with your health care provider. Document Revised: 09/29/2021 Document Reviewed: 09/29/2021 Elsevier Patient Education  2024 Else[employer] Inc.Circumcision, Adult  Circumcision is a surgery to remove or cut the fold of skin that covers the head of the penis (foreskin). When the foreskin is cut but not removed, the procedure is called a dorsal incision or dorsal circumcision. A dorsal circumcision leaves  the entire foreskin but makes the end of the foreskin looser so it can be pulled back over the head of the penis. Tell a health care provider about: Any allergies you have. All medicines you are taking, including vitamins, herbs, eye drops, creams, and over-the-counter medicines. Any problems you or family members have had with anesthetic medicines. Any bleeding problems you have. Any surgeries you have had. Any medical conditions you have, including the common cold or another infection. What are the risks? Generally, this is a safe procedure. However, problems may occur, including: Bleeding. Pain. Infection. Allergic reactions to medicines. Opening of the surgical wound. This can occur from an unwanted erection after surgery. What happens before the procedure? When to stop eating and drinking Follow instructions from your health care provider about what you may eat and drink before your procedure. These may include: 8 hours  before your procedure Stop eating most foods. Do not eat meat, fried foods, or fatty foods. Eat only light foods, such as toast or crackers. All liquids are okay except energy drinks and alcohol. 6 hours before your procedure Stop eating. Drink only clear liquids, such as water, clear fruit juice, black coffee, plain tea, and sports drinks. Do not drink energy drinks or alcohol. 2 hours before your procedure Stop drinking all liquids. You may be allowed to take medicines with small sips of water. If you do not follow your health care provider's instructions, your procedure may be delayed or canceled. Medicines Ask your health care provider about: Changing or stopping your regular medicines. This is especially important if you are taking diabetes medicines or blood thinners. Taking medicines such as aspirin  and ibuprofen . These medicines can thin your blood. Do not take these medicines unless your health care provider tells you to take them. Taking  over-the-counter medicines, vitamins, herbs, and supplements. General instructions  Do not use any products that contain nicotine or tobacco for at least 4 weeks before the procedure. These products include cigarettes, chewing tobacco, and vaping devices, such as e-cigarettes. If you need help quitting, ask your health care provider. If you will be going home right after the procedure, plan to have a responsible adult: Take you home from the hospital or clinic. You will not be allowed to drive. Care for you for the time you are told. Ask your health care provider: How your surgery site will be marked. What steps will be taken to help prevent infection. These may include: Washing skin with a germ-killing soap. Taking antibiotic medicine. What happens during the procedure? An IV may be inserted into one of your veins. You will be given one or more of the following: A medicine to help you relax (sedative). A medicine to numb the area (local anesthetic). This will be injected with a needle into the skin of your penis. An incision will be made to remove or cut the foreskin. Absorbable stitches (sutures) may be used to close the incision. A bandage (dressing) will be applied to the incision site. The IV will be removed. The procedure may vary among health care providers and hospitals. What happens after the procedure? Your blood pressure, heart rate, breathing rate, and blood oxygen level will be monitored until you leave the hospital or clinic. Do not get out of bed until your health care provider approves. If you were given a sedative during the procedure, it can affect you for several hours. Do not drive or operate machinery until your health care provider says that it is safe. Summary Circumcision is a surgery to remove or cut the fold of skin that covers the head of the penis. Follow instructions from your health care provider about eating and drinking before your procedure. If you will  be going home right after the procedure, plan to have a responsible adult take you home and care for you for the time you are told. This information is not intended to replace advice given to you by your health care provider. Make sure you discuss any questions you have with your health care provider. Document Revised: 09/29/2021 Document Reviewed: 09/29/2021 Elsevier Patient Education  2024 Elsevier Inc.How to Use Chlorhexidine at Home in the Shower Chlorhexidine gluconate (CHG) is a germ-killing (antiseptic) wash that's used to clean the skin. It can get rid of the germs that normally live on the skin and can keep them away for about 24 hours.  If you're having surgery, you may be told to shower with CHG at home the night before surgery. This can help lower your risk for infection. To use CHG wash in the shower, follow the steps below. Supplies needed: CHG body wash. Clean washcloth. Clean towel. How to use CHG in the shower Follow these steps unless you're told to use CHG in a different way: Start the shower. Use your normal soap and shampoo to wash your face and hair. Turn off the shower or move out of the shower stream. Pour CHG onto a clean washcloth. Do not use any type of brush or rough sponge. Start at your neck, washing your body down to your toes. Make sure you: Wash the part of your body where the surgery will be Schmidt for at least 1 minute. Do not scrub. Do not use CHG on your head or face unless your health care provider tells you to. If it gets into your ears or eyes, rinse them well with water. Do not wash your genitals with CHG. Wash your back and under your arms. Make sure to wash skin folds. Let the CHG sit on your skin for 1-2 minutes or as long as told. Rinse your entire body in the shower, including all body creases and folds. Turn off the shower. Dry off with a clean towel. Do not put anything on your skin afterward, such as powder, lotion, or perfume. Put on clean  clothes or pajamas. If it's the night before surgery, sleep in clean sheets. General tips Use CHG only as told, and follow the instructions on the label. Use the full amount of CHG as told. This is often one bottle. Do not smoke and stay away from flames after using CHG. Your skin may feel sticky after using CHG. This is normal. The sticky feeling will go away as the CHG dries. Do not use CHG: If you have a chlorhexidine allergy or have reacted to chlorhexidine in the past. On open wounds or areas of skin that have broken skin, cuts, or scrapes. On babies younger than 3 months of age. Contact a health care provider if: You have questions about using CHG. Your skin gets irritated or itchy. You have a rash after using CHG. You swallow any CHG. Call your local poison control center [phone] in the [place] Your eyes itch badly, or they become very red or swollen. Your hearing changes. You have trouble seeing. If you can't reach your provider, go to an urgent care or emergency room. Do not drive yourself. Get help right away if: You have swelling or tingling in your mouth or throat. You make high-pitched whistling sounds when you breathe, most often when you breathe out (wheeze). You have trouble breathing. These symptoms may be an emergency. Call 911 right away. Do not wait to see if the symptoms will go away. Do not drive yourself to the hospital. This information is not intended to replace advice given to you by your health care provider. Make sure you discuss any questions you have with your health care provider. Document Revised: 04/13/2023 Document Reviewed: 04/09/2022 Elsevier Patient Education  2024 Elsevier Inc.General Anesthesia, Adult, Care After The following information offers guidance on how to care for yourself after your procedure. Your health care provider may also give you more specific instructions. If you have problems or questions, contact your health care  provider. What can I expect after the procedure? After the procedure, it is common for people to: Have pain or discomfort  at the IV site. Have nausea or vomiting. Have a sore throat or hoarseness. Have trouble concentrating. Feel cold or chills. Feel weak, sleepy, or tired (fatigue). Have soreness and body aches. These can affect parts of the body that were not involved in surgery. Follow these instructions at home: For the time period you were told by your health care provider:  Rest. Do not participate in activities where you could fall or become injured. Do not drive or use machinery. Do not drink alcohol. Do not take sleeping pills or medicines that cause drowsiness. Do not make important decisions or sign legal documents. Do not take care of children on your own. General instructions Drink enough fluid to keep your urine pale yellow. If you have sleep apnea, surgery and certain medicines can increase your risk for breathing problems. Follow instructions from your health care provider about wearing your sleep device: Anytime you are sleeping, including during daytime naps. While taking prescription pain medicines, sleeping medicines, or medicines that make you drowsy. Return to your normal activities as told by your health care provider. Ask your health care provider what activities are safe for you. Take over-the-counter and prescription medicines only as told by your health care provider. Do not use any products that contain nicotine or tobacco. These products include cigarettes, chewing tobacco, and vaping devices, such as e-cigarettes. These can delay incision healing after surgery. If you need help quitting, ask your health care provider. Contact a health care provider if: You have nausea or vomiting that does not get better with medicine. You vomit every time you eat or drink. You have pain that does not get better with medicine. You cannot urinate or have bloody urine. You  develop a skin rash. You have a fever. Get help right away if: You have trouble breathing. You have chest pain. You vomit blood. These symptoms may be an emergency. Get help right away. Call 911. Do not wait to see if the symptoms will go away. Do not drive yourself to the hospital. Summary After the procedure, it is common to have a sore throat, hoarseness, nausea, vomiting, or to feel weak, sleepy, or fatigue. For the time period you were told by your health care provider, do not drive or use machinery. Get help right away if you have difficulty breathing, have chest pain, or vomit blood. These symptoms may be an emergency. This information is not intended to replace advice given to you by your health care provider. Make sure you discuss any questions you have with your health care provider. Document Revised: 12/26/2021 Document Reviewed: 12/26/2021 Elsevier Patient Education  2024 [employer].

## 2024-02-09 ENCOUNTER — Encounter (HOSPITAL_COMMUNITY)
Admission: RE | Admit: 2024-02-09 | Discharge: 2024-02-09 | Disposition: A | Discharge status: Home / Self Care | Source: Ambulatory Visit | Attending: Urology | Admitting: Urology

## 2024-02-09 VITALS — Ht 69.0 in | Wt 336.9 lb

## 2024-02-09 DIAGNOSIS — Z01812 Encounter for preprocedural laboratory examination: Secondary | ICD-10-CM | POA: Insufficient documentation

## 2024-02-09 DIAGNOSIS — Z01818 Encounter for other preprocedural examination: Secondary | ICD-10-CM | POA: Diagnosis present

## 2024-02-09 LAB — BASIC METABOLIC PANEL WITH GFR
Anion gap: 11 (ref 5–15)
BUN: 16 mg/dL (ref 6–20)
CO2: 27 mmol/L (ref 22–32)
Calcium: 8.4 mg/dL — ABNORMAL LOW (ref 8.9–10.3)
Chloride: 98 mmol/L (ref 98–111)
Creatinine, Ser: 0.97 mg/dL (ref 0.61–1.24)
GFR, Estimated: >60 mL/min (ref >60)
Glucose, Bld: 354 mg/dL — ABNORMAL HIGH (ref 70–99)
Potassium: 4.1 mmol/L (ref 3.5–5.1)
Sodium: 136 mmol/L (ref 135–145)

## 2024-02-10 ENCOUNTER — Ambulatory Visit (HOSPITAL_COMMUNITY): Payer: Self-pay | Admitting: Anesthesiology

## 2024-02-10 ENCOUNTER — Encounter (HOSPITAL_COMMUNITY)
Admission: RE | Disposition: A | Discharge status: Home / Self Care | Payer: Self-pay | Source: Home / Self Care | Attending: Urology

## 2024-02-10 ENCOUNTER — Ambulatory Visit (HOSPITAL_COMMUNITY)
Admission: RE | Admit: 2024-02-10 | Discharge: 2024-02-10 | Disposition: A | Discharge status: Home / Self Care | Attending: Urology | Admitting: Urology

## 2024-02-10 DIAGNOSIS — Z539 Procedure and treatment not carried out, unspecified reason: Secondary | ICD-10-CM | POA: Diagnosis not present

## 2024-02-10 DIAGNOSIS — I10 Essential (primary) hypertension: Secondary | ICD-10-CM | POA: Diagnosis not present

## 2024-02-10 DIAGNOSIS — E119 Type 2 diabetes mellitus without complications: Secondary | ICD-10-CM | POA: Diagnosis not present

## 2024-02-10 DIAGNOSIS — N471 Phimosis: Secondary | ICD-10-CM | POA: Insufficient documentation

## 2024-02-10 LAB — HEMOGLOBIN A1C
Hgb A1c MFr Bld: >15.5 % — ABNORMAL HIGH (ref 4.8–5.6)
Mean Plasma Glucose: >398 mg/dL

## 2024-02-10 LAB — GLUCOSE, CAPILLARY
Glucose-Capillary: 344 mg/dL — ABNORMAL HIGH (ref 70–99)
Glucose-Capillary: 294 mg/dL — ABNORMAL HIGH (ref 70–99)

## 2024-02-10 SURGERY — CIRCUMCISION, ADULT
Anesthesia: General

## 2024-02-10 MED ORDER — CEFAZOLIN SODIUM-DEXTROSE 2-4 GM/100ML-% IV SOLN: 2.0000 g | INTRAVENOUS | Status: DC

## 2024-02-10 MED ORDER — BUPIVACAINE HCL (PF) 0.25 % IJ SOLN
INTRAMUSCULAR | Status: AC
  Filled 2024-02-10: qty 30

## 2024-02-10 MED ORDER — ROCURONIUM BROMIDE 10 MG/ML (PF) SYRINGE
PREFILLED_SYRINGE | INTRAVENOUS | Status: AC
  Filled 2024-02-10: qty 10

## 2024-02-10 MED ORDER — ACETAMINOPHEN 500 MG PO TABS: 1000.0000 mg | ORAL_TABLET | Freq: Once | ORAL | Status: DC

## 2024-02-10 MED ORDER — ONDANSETRON HCL 4 MG/2ML IJ SOLN
INTRAMUSCULAR | Status: AC
  Filled 2024-02-10: qty 2

## 2024-02-10 MED ORDER — GLYCOPYRROLATE PF 0.2 MG/ML IJ SOSY
PREFILLED_SYRINGE | INTRAMUSCULAR | Status: AC
  Filled 2024-02-10: qty 1

## 2024-02-10 MED ORDER — ORAL CARE MOUTH RINSE: 15.0000 mL | Freq: Once | OROMUCOSAL | Status: AC

## 2024-02-10 MED ORDER — KETOROLAC TROMETHAMINE 30 MG/ML IJ SOLN
INTRAMUSCULAR | Status: AC
  Filled 2024-02-10: qty 1

## 2024-02-10 MED ORDER — ACETAMINOPHEN 160 MG/5ML PO SOLN
960.0000 mg | Freq: Once | ORAL | Status: DC
Start: 2024-02-10 — End: 2024-02-10
  Filled 2024-02-10: qty 30

## 2024-02-10 MED ORDER — INSULIN ASPART 100 UNIT/ML IJ SOLN
10.0000 [IU] | Freq: Once | INTRAMUSCULAR | Status: AC
  Administered 2024-02-10: 10 [IU] via SUBCUTANEOUS
  Filled 2024-02-10: qty 0.1

## 2024-02-10 MED ORDER — ACETAMINOPHEN 10 MG/ML IV SOLN
INTRAVENOUS | Status: AC
  Filled 2024-02-10: qty 100

## 2024-02-10 MED ORDER — LABETALOL HCL 5 MG/ML IV SOLN
5.0000 mg | INTRAVENOUS | Status: DC | PRN
  Administered 2024-02-10 (×2): 5 mg via INTRAVENOUS
  Filled 2024-02-10: qty 4

## 2024-02-10 MED ORDER — FENTANYL CITRATE (PF) 100 MCG/2ML IJ SOLN
INTRAMUSCULAR | Status: AC
  Filled 2024-02-10: qty 2

## 2024-02-10 MED ORDER — DEXAMETHASONE SODIUM PHOSPHATE 10 MG/ML IJ SOLN
INTRAMUSCULAR | Status: AC
  Filled 2024-02-10: qty 1

## 2024-02-10 MED ORDER — PROPOFOL 10 MG/ML IV BOLUS
INTRAVENOUS | Status: AC
  Filled 2024-02-10: qty 20

## 2024-02-10 MED ORDER — MIDAZOLAM HCL 2 MG/2ML IJ SOLN
INTRAMUSCULAR | Status: AC
  Filled 2024-02-10: qty 2

## 2024-02-10 MED ORDER — CEFAZOLIN SODIUM-DEXTROSE 2-4 GM/100ML-% IV SOLN
INTRAVENOUS | Status: AC
  Filled 2024-02-10: qty 100

## 2024-02-10 MED ORDER — LABETALOL HCL 5 MG/ML IV SOLN
10.0000 mg | Freq: Once | INTRAVENOUS | Status: AC
Start: 2024-02-10 — End: 2024-02-10
  Administered 2024-02-10: 10 mg via INTRAVENOUS

## 2024-02-10 MED ORDER — LIDOCAINE 2% (20 MG/ML) 5 ML SYRINGE
INTRAMUSCULAR | Status: AC
  Filled 2024-02-10: qty 5

## 2024-02-10 MED ORDER — CEFAZOLIN SODIUM-DEXTROSE 1-4 GM/50ML-% IV SOLN
INTRAVENOUS | Status: AC
  Filled 2024-02-10: qty 50

## 2024-02-10 MED ORDER — CHLORHEXIDINE GLUCONATE 0.12 % MT SOLN
15.0000 mL | Freq: Once | OROMUCOSAL | Status: AC
  Administered 2024-02-10: 15 mL via OROMUCOSAL

## 2024-02-10 MED ORDER — LACTATED RINGERS IV SOLN: INTRAVENOUS | Status: DC

## 2024-02-10 MED ORDER — LABETALOL HCL 5 MG/ML IV SOLN
INTRAVENOUS | Status: AC
  Filled 2024-02-10: qty 4

## 2024-02-10 NOTE — H&P (Signed)
 HPI: [name] is a 38yo here for circumcision. He has been applying clotrimazole  BID for 6 weeks. He notes the foreskin is less tense but he still connot retract his foreskin.      PMH:     Past Medical History:  Diagnosis Date   Asthma     CHF (congestive heart failure) (HCC)     COPD (chronic obstructive pulmonary disease) (HCC)     CVA (cerebral vascular accident) (HCC) 07/06/2022   Diabetes mellitus without complication (HCC)     HLD (hyperlipidemia) 08/11/2016   Hypertension     Obesity     Oxygen deficiency     Premature baby     Sleep apnea            Surgical History:      Past Surgical History:  Procedure Laterality Date   BUBBLE STUDY   07/23/2022    Procedure: BUBBLE STUDY;  Surgeon: Hazle Lites, MD;  Location: [facility] ENDOSCOPY;  Service: Cardiovascular;;   LEFT HEART CATH AND CORONARY ANGIOGRAPHY N/A 06/01/2017    Procedure: LEFT HEART CATH AND CORONARY ANGIOGRAPHY;  Surgeon: Lucendia Rusk, MD;  Location: [facility] INVASIVE CV LAB;  Service: Cardiovascular;  Laterality: N/A;   TEE WITHOUT CARDIOVERSION N/A 07/23/2022    Procedure: TRANSESOPHAGEAL ECHOCARDIOGRAM (TEE);  Surgeon: Hazle Lites, MD;  Location: [facility] ENDOSCOPY;  Service: Cardiovascular;  Laterality: N/A;   TONSILLECTOMY              Home Medications:  Allergies as of 12/31/2023         Reactions    Hydralazine  Other (See Comments)    Dizziness    Morphine And Codeine Palpitations    Heart beat fast     Nicoderm [nicotine] Rash    Rash from the patch             Medication List           Accurate as of December 31, 2023 11:08 AM. If you have any questions, ask your nurse or doctor.              albuterol 108 (90 Base) MCG/ACT inhaler Commonly known as: VENTOLIN HFA Inhale into the lungs.    allopurinol  300 MG tablet Commonly known as: ZYLOPRIM  Take 300 mg by mouth daily.    amLODipine  10 MG tablet Commonly known as: NORVASC  Take 1 tablet (10 mg total) by mouth daily.     atorvastatin  40 MG tablet Commonly known as: LIPITOR TAKE 1 TABLET BY MOUTH DAILY    blood glucose meter kit and supplies Kit Dispense based on patient and insurance preference. Use up to four times daily as directed. (FOR ICD-9 250.00, 250.01).    carvedilol  25 MG tablet Commonly known as: COREG  TAKE ONE TABLET BY MOUTH TWICE DAILY WITH FOOD    cloNIDine  0.2 MG tablet Commonly known as: CATAPRES  Take 1 tablet (0.2 mg total) by mouth 3 (three) times daily.    clotrimazole -betamethasone  cream Commonly known as: LOTRISONE  Apply 1 Application topically 2 (two) times daily.    furosemide  40 MG tablet Commonly known as: LASIX  Take by mouth.    valsartan  320 MG tablet Commonly known as: DIOVAN  Take 1 tablet (320 mg total) by mouth daily.             Allergies:  Allergies       Allergies  Allergen Reactions   Hydralazine  Other (See Comments)      Dizziness   Morphine And Codeine Palpitations  Heart beat fast    Nicoderm [Nicotine] Rash      Rash from the patch         Family History:      Family History  Problem Relation Age of Onset   Diabetes Maternal Uncle     Heart disease Maternal Uncle     Diabetes Maternal Grandmother     Hypertension Maternal Grandmother     Miscarriages / Stillbirths Maternal Grandfather            Social History:  reports that he quit smoking about 7 years ago. His smoking use included cigarettes. He started smoking about 26 years ago. He has a 4.6 pack-year smoking history. He has never used smokeless tobacco. He reports that he does not drink alcohol and does not use drugs.   ROS: All other review of systems were reviewed and are negative except what is noted above in HPI   Physical Exam: BP (!) 143/91   Pulse 91   Constitutional:  Alert and oriented, No acute distress. HEENT: [name], moist mucus membranes.  Trachea midline, no masses. Cardiovascular: No clubbing, cyanosis, or edema. Respiratory: Normal respiratory  effort, no increased work of breathing. GI: Abdomen is soft, nontender, nondistended, no abdominal masses GU: No CVA tenderness.  Lymph: No cervical or inguinal lymphadenopathy. Skin: No rashes, bruises or suspicious lesions. Neurologic: Grossly intact, no focal deficits, moving all 4 extremities. Psychiatric: Normal mood and affect.   Laboratory Data: Recent Labs       Lab Results  Component Value Date    WBC 7.7 07/06/2022    HGB 14.4 07/06/2022    HCT 47.7 07/06/2022    MCV 90.5 07/06/2022    PLT 272 07/06/2022        Recent Labs       Lab Results  Component Value Date    CREATININE 1.2 12/08/2022        Recent Labs  No results found for: "PSA"     Recent Labs  No results found for: "TESTOSTERONE"     Recent Labs       Lab Results  Component Value Date    HGBA1C 10.3 12/08/2022        Urinalysis Labs (Brief)  No results found for: "COLORURINE", "APPEARANCEUR", "LABSPEC", "PHURINE", "GLUCOSEU", "HGBUR", "BILIRUBINUR", "KETONESUR", "PROTEINUR", "UROBILINOGEN", "NITRITE", "LEUKOCYTESUR"     Recent Labs  No results found for: "LABMICR", "WBCUA", "RBCUA", "LABEPIT", "MUCUS", "BACTERIA"     Pertinent Imaging:   No results found for this or any previous visit.   No results found for this or any previous visit.   No results found for this or any previous visit.   No results found for this or any previous visit.   No results found for this or any previous visit.   No results found for this or any previous visit.   No results found for this or any previous visit.   No results found for this or any previous visit.     Assessment & Plan:     1. Balanitis (Primary) with phimosis -the risks/benefits/alterantives to circumcision was explained to the patient and he understands and wishes to proceed with surgery

## 2024-02-10 NOTE — Anesthesia Preprocedure Evaluation (Addendum)
 Anesthesia Evaluation  Patient identified by MRN, date of birth, ID band Patient awake    Reviewed: Allergy & Precautions, NPO status , Patient's Chart, lab work & pertinent test results, reviewed documented beta blocker date and time   Airway Mallampati: III  TM Distance: >3 FB Neck ROM: Full    Dental  (+) Poor Dentition   Pulmonary shortness of breath, asthma , sleep apnea , COPD,  oxygen dependent, former smoker   Pulmonary exam normal breath sounds clear to auscultation       Cardiovascular hypertension, Pt. on medications and Pt. on home beta blockers pulmonary hypertension+CHF  Normal cardiovascular exam Rhythm:Regular Rate:Normal  History cardiomyopathy but most recent echo was normal   Neuro/Psych CVA  negative psych ROS   GI/Hepatic negative GI ROS, Neg liver ROS,,,  Endo/Other  diabetes, Poorly Controlled, Type 2, Oral Hypoglycemic Agents  Class 4 obesity  Renal/GU negative Renal ROS     Musculoskeletal negative musculoskeletal ROS (+)    Abdominal  (+) + obese  Peds  Hematology negative hematology ROS (+)   Anesthesia Other Findings Gave 30 mg labetalol  and BP still 176/116.  Blood sugar still 294 after 10 units regular insulin  SQ.  With his ASA 4 status was not safe to proceed without him being better optimized before surgery.  He had not even taken his newest BP med yet ( diovan  ).  Reproductive/Obstetrics                             Anesthesia Physical Anesthesia Plan  ASA: 4  Anesthesia Plan: General   Post-op Pain Management: Minimal or no pain anticipated   Induction: Intravenous  PONV Risk Score and Plan: Ondansetron , Dexamethasone  and Midazolam   Airway Management Planned: LMA  Additional Equipment: None  Intra-op Plan:   Post-operative Plan: Extubation in OR  Informed Consent: I have reviewed the patients History and Physical, chart, labs and discussed the  procedure including the risks, benefits and alternatives for the proposed anesthesia with the patient or authorized representative who has indicated his/her understanding and acceptance.     Dental advisory given  Plan Discussed with: CRNA  Anesthesia Plan Comments:         Anesthesia Quick Evaluation

## 2024-02-10 NOTE — Telephone Encounter (Signed)
 Copy of this encounter faxed to Dr. Claretta Croft

## 2024-02-10 NOTE — Progress Notes (Signed)
 Dressed self. Voices no c/o at this time. D/C to home in good condition.

## 2024-02-10 NOTE — Progress Notes (Signed)
 Pt refused to have mother at bedside. Pt stated she would "give me a hard time" about elevated BP.

## 2024-02-10 NOTE — Progress Notes (Addendum)
 Total dose of labetalol  30mg  IV given. BP remains 180/109. Dr Fredric Jean aware. Surgery canceled. Dr Claretta Croft notified. Agrees with cancellation. Pt notified. Instructed to F/U with PCP for evaluation. Voiced understanding. List given to pt of BP readings, blood sugar results and meds given, for PCP.

## 2024-02-23 ENCOUNTER — Ambulatory Visit: Admitting: Urology

## 2024-02-28 ENCOUNTER — Telehealth: Payer: Self-pay | Admitting: Nurse Practitioner

## 2024-02-28 NOTE — Telephone Encounter (Signed)
 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 He says that he needs this completed as soon as possible and would like a call back to discuss.

## 2024-02-28 NOTE — Telephone Encounter (Signed)
 Patient stated that he will be bringing by [facility] forms to be filled out by provider

## 2024-04-05 ENCOUNTER — Ambulatory Visit: Admitting: Urology

## 2024-04-05 ENCOUNTER — Encounter: Payer: Self-pay | Admitting: Urology

## 2024-04-05 VITALS — BP 152/95 | HR 87

## 2024-04-05 DIAGNOSIS — N481 Balanitis: Secondary | ICD-10-CM

## 2024-04-05 MED ORDER — CLOTRIMAZOLE-BETAMETHASONE 1-0.05 % EX CREA: 1.0000 | TOPICAL_CREAM | Freq: Two times a day (BID) | CUTANEOUS | 3 refills | Status: DC

## 2024-04-05 NOTE — Patient Instructions (Signed)
 Balanitis  Balanitis is swelling and irritation of the head of the penis (glans penis). Balanitis occurs most often among males who have not had their foreskin removed (uncircumcised). In uncircumcised males, the condition may also cause inflammation of the skin around the foreskin. Balanitis sometimes causes scarring of the penis or foreskin, which can require surgery. This condition may develop because of an infection or another medical condition. Untreated balanitis can increase the risk of penile cancer. What are the causes? Common causes of this condition include: Irritation and lack of airflow due to fluid (smegma) that can build up on the glans penis. Poor personal hygiene, especially in uncircumcised males. Not cleaning the glans penis and foreskin well can result in a buildup of bacteria, viruses, and yeast, which can lead to infection and inflammation. Other causes include: Chemical irritation from products such as soaps or shower gels, especially those that have fragrance. Chemical irritation can also be caused by condoms, personal lubricants, petroleum jelly, spermicides, fabric softeners, or laundry detergents. Skin conditions, such as eczema, dermatitis, and psoriasis. Allergies to medicines, such as tetracycline and sulfa drugs. What increases the risk? The following factors may make you more likely to develop this condition: Being an uncircumcised male. Having diabetes. Having other medical conditions, including liver cirrhosis, congestive heart failure, or kidney disease. Having infections, such as candidiasis, HPV (human papillomavirus), herpes simplex, gonorrhea, or syphilis. Having a tight foreskin that is difficult to pull back (retract) past the glans penis. Being severely obese. History of reactive arthritis. What are the signs or symptoms? Symptoms of this condition include: Discharge from under the foreskin, and pain or difficulty retracting the foreskin. A bad smell  or itchiness on the penis. Tenderness, redness, and swelling of the glans penis. A rash or sores on the glans penis or foreskin. Inability to get an erection due to pain. Trouble urinating. Scarring of the penis or foreskin, in some cases. How is this diagnosed? This condition may be diagnosed based on a physical exam and tests of a swab of discharge to check for bacterial or fungal infection. You may also have blood tests to check for: Viruses that can cause balanitis. A high blood sugar (glucose) level. This could be a sign of diabetes, which can increase the risk of balanitis. How is this treated? Treatment for this condition depends on the cause. Treatment may include: Improving personal hygiene. Your health care provider may recommend sitting in a bath of warm water that is deep enough to cover your hips and buttocks (sitz bath). Medicines such as: Creams or ointments to reduce swelling (steroids) or to treat an infection. Antibiotic medicine. Antifungal medicine. Having surgery to remove or cut the foreskin (circumcision). This may be done if you have scarring on the foreskin that makes it difficult to retract. Controlling other medical problems that may be causing your condition or making it worse. Follow these instructions at home: Medicines Take over-the-counter and prescription medicines only as told by your health care provider. If you were prescribed an antibiotic medicine, use it as told by your health care provider. Do not stop using the antibiotic even if you start to feel better. General instructions Do not have sex until the condition clears up, or until your health care provider approves. Keep your penis clean and dry. Take sitz baths as recommended by your health care provider. Avoid products that irritate your skin or make symptoms worse, such as soaps and shower gels that have fragrance. Keep all follow-up visits. This is  important. Contact a health care provider  if: Your symptoms get worse or do not improve with home care. You develop chills or a fever. You have trouble urinating. You cannot retract your foreskin. Get help right away if: You develop severe pain. You are unable to urinate. Summary Balanitis is swelling and irritation of the head of the penis (glans penis). This condition is most common among uncircumcised males. Balanitis causes pain, redness, and swelling of the glans penis. Good personal hygiene is important. Treatment may include improving personal hygiene and applying creams or ointments. Contact a health care provider if your symptoms get worse or do not improve with home care. This information is not intended to replace advice given to you by your health care provider. Make sure you discuss any questions you have with your health care provider. Document Revised: 03/12/2021 Document Reviewed: 03/12/2021 Elsevier Patient Education  2024 [employer].

## 2024-04-05 NOTE — Progress Notes (Signed)
 04/05/2024 11:15 AM   Ross Schmidt 1986-08-29 [id number]  Referring provider: Carlette Benita Area, MD [address] [place],  [place] [address]  Followup balanitis   HPI: Ross Schmidt is a 38yo here for followup for balanitis. His circumcision was cancelled due to uncontrolled hypertension and DMII. Blood sugars in the low to mid 200s. He uses the clotrimazole  prn for his balanitis.    PMH: Past Medical History:  Diagnosis Date   Asthma    CHF (congestive heart failure) (HCC)    COPD (chronic obstructive pulmonary disease) (HCC)    CVA (cerebral vascular accident) (HCC) 07/06/2022   Diabetes mellitus without complication (HCC)    HLD (hyperlipidemia) 08/11/2016   Hypertension    Obesity    Oxygen deficiency    Premature baby    Sleep apnea     Surgical History: Past Surgical History:  Procedure Laterality Date   BUBBLE STUDY  07/23/2022   Procedure: BUBBLE STUDY;  Surgeon: Mona Vinie BROCKS, MD;  Location: [facility] ENDOSCOPY;  Service: Cardiovascular;;   LEFT HEART CATH AND CORONARY ANGIOGRAPHY N/A 06/01/2017   Procedure: LEFT HEART CATH AND CORONARY ANGIOGRAPHY;  Surgeon: Dann Candyce RAMAN, MD;  [facility] INVASIVE CV LAB;  Service: Cardiovascular;  Laterality: N/A;   TEE WITHOUT CARDIOVERSION N/A 07/23/2022   Procedure: TRANSESOPHAGEAL ECHOCARDIOGRAM (TEE);  Surgeon: Mona Vinie BROCKS, MD;  Location: [facility] ENDOSCOPY;  Service: Cardiovascular;  Laterality: N/A;   TONSILLECTOMY      Home Medications:  Allergies as of 04/05/2024       Reactions   Hydralazine  Other (See Comments)   Dizziness   Morphine And Codeine Palpitations   Heart beat fast    Nicoderm [nicotine] Rash   Rash from the patch         Medication List        Accurate as of April 05, 2024 11:15 AM. If you have any questions, ask your nurse or doctor.          Accu-Chek Guide Test test strip Generic drug: glucose blood   albuterol 108 (90 Base) MCG/ACT inhaler Commonly known  as: VENTOLIN HFA Inhale into the lungs.   allopurinol  300 MG tablet Commonly known as: ZYLOPRIM  Take 300 mg by mouth daily as needed (gout).   amLODipine  10 MG tablet Commonly known as: NORVASC  Take 1 tablet (10 mg total) by mouth daily.   atorvastatin  40 MG tablet Commonly known as: LIPITOR TAKE 1 TABLET BY MOUTH DAILY What changed: when to take this   blood glucose meter kit and supplies Kit Dispense based on patient and insurance preference. Use up to four times daily as directed. (FOR ICD-9 250.00, 250.01).   carvedilol  25 MG tablet Commonly known as: COREG  TAKE ONE TABLET BY MOUTH TWICE DAILY WITH FOOD   cloNIDine  0.2 MG tablet Commonly known as: CATAPRES  Take 1 tablet (0.2 mg total) by mouth 3 (three) times daily.   clopidogrel  75 MG tablet Commonly known as: PLAVIX  Take 75 mg by mouth in the morning.   clotrimazole -betamethasone  cream Commonly known as: LOTRISONE  Apply 1 Application topically 2 (two) times daily.   Lantus SoloStar 100 UNIT/ML Solostar Pen Generic drug: insulin  glargine Inject 30 Units into the skin daily as needed (when he feels his blood sugar is high/feeling tired).   valsartan  320 MG tablet Commonly known as: DIOVAN  Take 1 tablet (320 mg total) by mouth daily.        Allergies:  Allergies  Allergen Reactions   Hydralazine  Other (See  Comments)    Dizziness   Morphine And Codeine Palpitations    Heart beat fast    Nicoderm [Nicotine] Rash    Rash from the patch     Family History: Family History  Problem Relation Age of Onset   Diabetes Maternal Uncle    Heart disease Maternal Uncle    Diabetes Maternal Grandmother    Hypertension Maternal Grandmother    Miscarriages / Stillbirths Maternal Grandfather     Social History:  reports that he quit smoking about 8 years ago. His smoking use included cigarettes. He started smoking about 26 years ago. He has a 4.6 pack-year smoking history. He has never used smokeless tobacco. He  reports that he does not drink alcohol and does not use drugs.  ROS: All other review of systems were reviewed and are negative except what is noted above in HPI  Physical Exam: BP (!) 152/95   Pulse 87   Constitutional:  Alert and oriented, No acute distress. HEENT: [place] AT, moist mucus membranes.  Trachea midline, no masses. Cardiovascular: No clubbing, cyanosis, or edema. Respiratory: Normal respiratory effort, no increased work of breathing. GI: Abdomen is soft, nontender, nondistended, no abdominal masses GU: No CVA tenderness.  Lymph: No cervical or inguinal lymphadenopathy. Skin: No rashes, bruises or suspicious lesions. Neurologic: Grossly intact, no focal deficits, moving all 4 extremities. Psychiatric: Normal mood and affect.  Laboratory Data: Lab Results  Component Value Date   WBC 7.7 07/06/2022   HGB 14.4 07/06/2022   HCT 47.7 07/06/2022   MCV 90.5 07/06/2022   PLT 272 07/06/2022    Lab Results  Component Value Date   CREATININE 0.97 02/09/2024    No results found for: PSA  No results found for: TESTOSTERONE  Lab Results  Component Value Date   HGBA1C >15.5 (H) 02/09/2024    Urinalysis No results found for: COLORURINE, APPEARANCEUR, LABSPEC, PHURINE, GLUCOSEU, HGBUR, BILIRUBINUR, KETONESUR, PROTEINUR, UROBILINOGEN, NITRITE, LEUKOCYTESUR  No results found for: LABMICR, WBCUA, RBCUA, LABEPIT, MUCUS, BACTERIA  Pertinent Imaging:  No results found for this or any previous visit.  No results found for this or any previous visit.  No results found for this or any previous visit.  No results found for this or any previous visit.  No results found for this or any previous visit.  No results found for this or any previous visit.  No results found for this or any previous visit.  No results found for this or any previous visit.   Assessment & Plan:    1. Balanitis (Primary) We will schedule for  circumcision. He will need clearance as his last surgery was cancelled due to uncontrolled hypertension and poorly controlled DMII. - Urinalysis, Routine w reflex microscopic   No follow-ups on file.  Belvie Clara, MD  [facility] [place]

## 2024-04-28 ENCOUNTER — Telehealth: Payer: Self-pay

## 2024-04-28 NOTE — Telephone Encounter (Signed)
   Name: Ross Schmidt  DOB: January 04, 1986  MRN: [id number]  Primary Cardiologist: Vinie JAYSON Maxcy, MD   Preoperative team, please contact this patient and set up a phone call appointment for further preoperative risk assessment. Please obtain consent and complete medication review. Patient's Plavix  is not managed by cardiology. He takes with history of CVA. Please inform surgical team to contact prescribing provider.Thank you for your help.   I also confirmed the patient resides in the state of [place] . As per [facility] Board telemedicine laws, the patient must reside in the state in which the provider is licensed.   Artist Pouch, PA-C 04/28/2024, 12:58 PM [facility]

## 2024-04-28 NOTE — Telephone Encounter (Signed)
   Pre-operative Risk Assessment    Patient Name: Ross Schmidt  DOB: 1986-02-14 MRN: [id number]   Date of last office visit: 01/04/24 Date of next office visit: Not scheduled   Request for Surgical Clearance    Procedure:  Circumcision   Date of Surgery:  Clearance 05/04/24                                Surgeon:  Dr. Sherrilee Socks Group or Practice Name:  [facility] Phone number:  [phone] Fax number:  [phone]   Type of Clearance Requested:   - Medical  - Pharmacy:  Hold Clopidogrel  (Plavix ) x5 days prior   Type of Anesthesia:  General    Additional requests/questions:    Bonney Ival LOISE Gerome   04/28/2024, 12:52 PM

## 2024-04-28 NOTE — Telephone Encounter (Signed)
 Note routed to Urology 

## 2024-04-28 NOTE — Patient Instructions (Signed)
 Ross Schmidt  04/28/2024     @PREFPERIOPPHARMACY @   Your procedure is scheduled on 05/04/2024.   Report to [facility] at  0900  A.M.   Call this number if you have problems the morning of surgery:  [phone]  If you experience any cold or flu symptoms such as cough, fever, chills, shortness of breath, etc. between now and your scheduled surgery, please notify us  at the above number.   Remember:         Stop plavix  as directed by PCP.         Take 1/2 of your usual night rime dosage of insulin  the night before your procedure.        DO NOT take any medications for diabetes the morning of your procedure.   Do not eat after midnight.    You may drink clear liquids until  0700 am on 05/04/2024.    Clear liquids allowed are:                    Water, Juice (No red color; non-citric and without pulp; diabetics please choose diet or no sugar options), Carbonated beverages (diabetics please choose diet or no sugar options), Clear Tea (No creamer, milk, or cream, including half & half and powdered creamer), Black Coffee Only (No creamer, milk or cream, including half & half and powdered creamer), and Clear Sports drink (No red color; diabetics please choose diet or no sugar options)    Take these medicines the morning of surgery with A SIP OF WATER                     allopurinol , amlodipine , carvedilol , clonidine .    Do not wear jewelry, make-up or nail polish, including gel polish,  artificial nails, or any other type of covering on natural nails (fingers and  toes).  Do not wear lotions, powders, or perfumes, or deodorant.  Do not shave 48 hours prior to surgery.  Men may shave face and neck.  Do not bring valuables to the hospital.  [facility] is not responsible for any belongings or valuables.  Contacts, dentures or bridgework may not be worn into surgery.  Leave your suitcase in the car.  After surgery it may be brought to your room.  For patients  admitted to the hospital, discharge time will be determined by your treatment team.  Patients discharged the day of surgery will not be allowed to drive home and must have someone with them for 24 hours.    Special instructions:   DO  NOT smoke tobacco or vape for 24 hours before your procedure.  Please read over the following fact sheets that you were given. Pain Booklet, Coughing and Deep Breathing, Surgical Site Infection Prevention, Anesthesia Post-op Instructions, and Care and Recovery After Surgery      Circumcision, Adult, Care After After a circumcision, it is common for the area around your cut from surgery (incision) to have: Redness. Swelling. Soreness. Follow these instructions at home: Your doctor may give you more instructions. If you have problems, contact your doctor. Medicines Take or use over-the-counter and prescription medicines only as told by your doctor. If you were prescribed an antibiotic medicine, use it as told by your doctor. Do not stop using it even if you start to feel better. Bathing Do not get your incision area wet for 24 hours after the procedure, or as long as told by  your doctor. Do not take baths, swim, or use a hot tub. Ask your doctor about taking showers or sponge baths. When you can shower, do not rub the incision area. Gently pat it dry. Incision care  Follow instructions from your doctor about how to take care of your incision. Make sure you: Wash your hands with soap and water for at least 20 seconds before and after you change your bandage (dressing). If you cannot use soap and water, use hand sanitizer. Change your dressing. Leave the stitches alone. They will disappear over time. Check your incision area every day for signs of infection. Check for: More redness, swelling, or pain. More fluid or blood. Warmth. Pus or a bad smell. Managing pain and swelling If told, put ice on the painful area. To do this: Put ice in a plastic  bag. Place a towel between your skin and the bag. Leave the ice on for 20 minutes, 2-3 times a day. Take off the ice if your skin turns bright red. This is very important. If you cannot feel pain, heat, or cold, you have a greater risk of damage to the area.  Activity  If you were given a sedative during your procedure, do not drive or use machines until your doctor says that it is safe. A sedative is a medicine that helps you relax. You may have to avoid lifting. Ask your doctor how much you can safely lift. Do not have sex until your doctor says it is okay. Rest as told by your doctor. Return to your normal activities when your doctor says that it is safe. Avoid contact sports and biking until your doctor says it is okay. General instructions Do not smoke or use any products that contain nicotine or tobacco. These can make it take longer for your incision to heal. If you need help quitting, ask your doctor. Drink enough fluid to keep your pee (urine) pale yellow. Keep all follow-up visits. Contact a doctor if: You have a fever. Medicine does not help your pain. You have any of these signs of infection around your incision: More redness, swelling, or pain. More fluid or blood. Warmth. Pus or a bad smell. Your incision breaks open. Get help right away if: You cannot pee, or it hurts to pee. There is redness, swelling, and soreness that spreads up the shaft of your penis, your thighs, or your lower belly (abdomen). You have bleeding that does not stop when you press on it. Summary After the procedure, it is common to have redness, swelling, and soreness around your cut from surgery (incision). Follow instructions from your doctor about how to take care of your incision. Check your incision area every day for signs of infection. Do not have sex until your doctor says it is okay. This information is not intended to replace advice given to you by your health care provider. Make sure  you discuss any questions you have with your health care provider. Document Revised: 09/29/2021 Document Reviewed: 09/29/2021 Elsevier Patient Education  2024 Elsevier Inc.General Anesthesia, Adult, Care After The following information offers guidance on how to care for yourself after your procedure. Your health care provider may also give you more specific instructions. If you have problems or questions, contact your health care provider. What can I expect after the procedure? After the procedure, it is common for people to: Have pain or discomfort at the IV site. Have nausea or vomiting. Have a sore throat or hoarseness. Have trouble concentrating. Feel  cold or chills. Feel weak, sleepy, or tired (fatigue). Have soreness and body aches. These can affect parts of the body that were not involved in surgery. Follow these instructions at home: For the time period you were told by your health care provider:  Rest. Do not participate in activities where you could fall or become injured. Do not drive or use machinery. Do not drink alcohol. Do not take sleeping pills or medicines that cause drowsiness. Do not make important decisions or sign legal documents. Do not take care of children on your own. General instructions Drink enough fluid to keep your urine pale yellow. If you have sleep apnea, surgery and certain medicines can increase your risk for breathing problems. Follow instructions from your health care provider about wearing your sleep device: Anytime you are sleeping, including during daytime naps. While taking prescription pain medicines, sleeping medicines, or medicines that make you drowsy. Return to your normal activities as told by your health care provider. Ask your health care provider what activities are safe for you. Take over-the-counter and prescription medicines only as told by your health care provider. Do not use any products that contain nicotine or tobacco. These  products include cigarettes, chewing tobacco, and vaping devices, such as e-cigarettes. These can delay incision healing after surgery. If you need help quitting, ask your health care provider. Contact a health care provider if: You have nausea or vomiting that does not get better with medicine. You vomit every time you eat or drink. You have pain that does not get better with medicine. You cannot urinate or have bloody urine. You develop a skin rash. You have a fever. Get help right away if: You have trouble breathing. You have chest pain. You vomit blood. These symptoms may be an emergency. Get help right away. Call 911. Do not wait to see if the symptoms will go away. Do not drive yourself to the hospital. Summary After the procedure, it is common to have a sore throat, hoarseness, nausea, vomiting, or to feel weak, sleepy, or fatigue. For the time period you were told by your health care provider, do not drive or use machinery. Get help right away if you have difficulty breathing, have chest pain, or vomit blood. These symptoms may be an emergency. This information is not intended to replace advice given to you by your health care provider. Make sure you discuss any questions you have with your health care provider. Document Revised: 12/26/2021 Document Reviewed: 12/26/2021 Elsevier Patient Education  2024 Elsevier Inc.How to Use Chlorhexidine  at Home in the Shower Chlorhexidine  gluconate (CHG) is a germ-killing (antiseptic) wash that's used to clean the skin. It can get rid of the germs that normally live on the skin and can keep them away for about 24 hours. If you're having surgery, you may be told to shower with CHG at home the night before surgery. This can help lower your risk for infection. To use CHG wash in the shower, follow the steps below. Supplies needed: CHG body wash. Clean washcloth. Clean towel. How to use CHG in the shower Follow these steps unless you're told to  use CHG in a different way: Start the shower. Use your normal soap and shampoo to wash your face and hair. Turn off the shower or move out of the shower stream. Pour CHG onto a clean washcloth. Do not use any type of brush or rough sponge. Start at your neck, washing your body down to your toes. Make sure you:  Wash the part of your body where the surgery will be done for at least 1 minute. Do not scrub. Do not use CHG on your head or face unless your health care provider tells you to. If it gets into your ears or eyes, rinse them well with water. Do not wash your genitals with CHG. Wash your back and under your arms. Make sure to wash skin folds. Let the CHG sit on your skin for 1-2 minutes or as long as told. Rinse your entire body in the shower, including all body creases and folds. Turn off the shower. Dry off with a clean towel. Do not put anything on your skin afterward, such as powder, lotion, or perfume. Put on clean clothes or pajamas. If it's the night before surgery, sleep in clean sheets. General tips Use CHG only as told, and follow the instructions on the label. Use the full amount of CHG as told. This is often one bottle. Do not smoke and stay away from flames after using CHG. Your skin may feel sticky after using CHG. This is normal. The sticky feeling will go away as the CHG dries. Do not use CHG: If you have a chlorhexidine  allergy or have reacted to chlorhexidine  in the past. On open wounds or areas of skin that have broken skin, cuts, or scrapes. On babies younger than 76 months of age. Contact a health care provider if: You have questions about using CHG. Your skin gets irritated or itchy. You have a rash after using CHG. You swallow any CHG. Call your local poison control center [phone] in the [place] Your eyes itch badly, or they become very red or swollen. Your hearing changes. You have trouble seeing. If you can't reach your provider, go to an urgent care  or emergency room. Do not drive yourself. Get help right away if: You have swelling or tingling in your mouth or throat. You make high-pitched whistling sounds when you breathe, most often when you breathe out (wheeze). You have trouble breathing. These symptoms may be an emergency. Call 911 right away. Do not wait to see if the symptoms will go away. Do not drive yourself to the hospital. This information is not intended to replace advice given to you by your health care provider. Make sure you discuss any questions you have with your health care provider. Document Revised: 04/13/2023 Document Reviewed: 04/09/2022 Elsevier Patient Education  2024 [employer].

## 2024-05-01 ENCOUNTER — Encounter (HOSPITAL_COMMUNITY)
Admission: RE | Admit: 2024-05-01 | Discharge: 2024-05-01 | Disposition: A | Discharge status: Home / Self Care | Source: Ambulatory Visit | Attending: Urology | Admitting: Urology

## 2024-05-01 VITALS — Ht 69.0 in | Wt 336.9 lb

## 2024-05-01 DIAGNOSIS — I10 Essential (primary) hypertension: Secondary | ICD-10-CM

## 2024-05-01 DIAGNOSIS — I272 Pulmonary hypertension, unspecified: Secondary | ICD-10-CM

## 2024-05-01 DIAGNOSIS — E1169 Type 2 diabetes mellitus with other specified complication: Secondary | ICD-10-CM

## 2024-05-03 ENCOUNTER — Telehealth: Payer: Self-pay | Admitting: Nurse Practitioner

## 2024-05-03 NOTE — Telephone Encounter (Signed)
 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

## 2024-05-09 NOTE — Telephone Encounter (Signed)
 Called pt to inform him forms is ready for pick up. He plans to pick up today 7/29

## 2024-05-18 ENCOUNTER — Ambulatory Visit: Admitting: Urology

## 2024-05-23 NOTE — Patient Instructions (Signed)
 Ross Schmidt  05/23/2024     @PREFPERIOPPHARMACY @   Your procedure is scheduled on  05/29/2024.   Report to [facility] at  1230 P.M.   Call this number if you have problems the morning of surgery:  [phone]  If you experience any cold or flu symptoms such as cough, fever, chills, shortness of breath, etc. between now and your scheduled surgery, please notify us  at the above number.   Remember:       Stop your plavix  as directed by your PCP.       Use your inhaler before you come and bring your rescue inhaler with you.         Take 1/2 of your usual night time insulin  the night before your procedure.   Do not eat after midnight.   You may drink clear liquids until 1030 am on 05/29/2024.     Clear liquids allowed are:                    Water, Juice (No red color; non-citric and without pulp; diabetics please choose diet or no sugar options), Carbonated beverages (diabetics please choose diet or no sugar options), Clear Tea (No creamer, milk, or cream, including half & half and powdered creamer), Black Coffee Only (No creamer, milk or cream, including half & half and powdered creamer), and Clear Sports drink (No red color; diabetics please choose diet or no sugar options)    Take these medicines the morning of surgery with A SIP OF WATER                             allopurinol , amlodipine , carvedilol , clonidine .    Do not wear jewelry, make-up or nail polish, including gel polish,  artificial nails, or any other type of covering on natural nails (fingers and  toes).  Do not wear lotions, powders, or perfumes, or deodorant.  Do not shave 48 hours prior to surgery.  Men may shave face and neck.  Do not bring valuables to the hospital.  [facility] is not responsible for any belongings or valuables.  Contacts, dentures or bridgework may not be worn into surgery.  Leave your suitcase in the car.  After surgery it may be brought to your room.  For patients  admitted to the hospital, discharge time will be determined by your treatment team.  Patients discharged the day of surgery will not be allowed to drive home and must have someone with them for 24 hours.    Special instructions:   DO NOT smoke tobacco or vape for 24 hours before your procedure.  Please read over the following fact sheets that you were given. Pain Booklet, Coughing and Deep Breathing, Surgical Site Infection Prevention, Anesthesia Post-op Instructions, and Care and Recovery After Surgery      Circumcision, Adult, Care After After a circumcision, it is common for the area around your cut from surgery (incision) to have: Redness. Swelling. Soreness. Follow these instructions at home: Your doctor may give you more instructions. If you have problems, contact your doctor. Medicines Take or use over-the-counter and prescription medicines only as told by your doctor. If you were prescribed an antibiotic medicine, use it as told by your doctor. Do not stop using it even if you start to feel better. Bathing Do not get your incision area wet for 24 hours after the procedure, or as  long as told by your doctor. Do not take baths, swim, or use a hot tub. Ask your doctor about taking showers or sponge baths. When you can shower, do not rub the incision area. Gently pat it dry. Incision care  Follow instructions from your doctor about how to take care of your incision. Make sure you: Wash your hands with soap and water for at least 20 seconds before and after you change your bandage (dressing). If you cannot use soap and water, use hand sanitizer. Change your dressing. Leave the stitches alone. They will disappear over time. Check your incision area every day for signs of infection. Check for: More redness, swelling, or pain. More fluid or blood. Warmth. Pus or a bad smell. Managing pain and swelling If told, put ice on the painful area. To do this: Put ice in a plastic  bag. Place a towel between your skin and the bag. Leave the ice on for 20 minutes, 2-3 times a day. Take off the ice if your skin turns bright red. This is very important. If you cannot feel pain, heat, or cold, you have a greater risk of damage to the area.  Activity  If you were given a sedative during your procedure, do not drive or use machines until your doctor says that it is safe. A sedative is a medicine that helps you relax. You may have to avoid lifting. Ask your doctor how much you can safely lift. Do not have sex until your doctor says it is okay. Rest as told by your doctor. Return to your normal activities when your doctor says that it is safe. Avoid contact sports and biking until your doctor says it is okay. General instructions Do not smoke or use any products that contain nicotine or tobacco. These can make it take longer for your incision to heal. If you need help quitting, ask your doctor. Drink enough fluid to keep your pee (urine) pale yellow. Keep all follow-up visits. Contact a doctor if: You have a fever. Medicine does not help your pain. You have any of these signs of infection around your incision: More redness, swelling, or pain. More fluid or blood. Warmth. Pus or a bad smell. Your incision breaks open. Get help right away if: You cannot pee, or it hurts to pee. There is redness, swelling, and soreness that spreads up the shaft of your penis, your thighs, or your lower belly (abdomen). You have bleeding that does not stop when you press on it. Summary After the procedure, it is common to have redness, swelling, and soreness around your cut from surgery (incision). Follow instructions from your doctor about how to take care of your incision. Check your incision area every day for signs of infection. Do not have sex until your doctor says it is okay. This information is not intended to replace advice given to you by your health care provider. Make sure  you discuss any questions you have with your health care provider. Document Revised: 09/29/2021 Document Reviewed: 09/29/2021 Elsevier Patient Education  2024 [employer]General Anesthesia, Adult, Care After The following information offers guidance on how to care for yourself after your procedure. Your health care provider may also give you more specific instructions. If you have problems or questions, contact your health care provider. What can I expect after the procedure? After the procedure, it is common for people to: Have pain or discomfort at the IV site. Have nausea or vomiting. Have a sore throat or hoarseness.  Have trouble concentrating. Feel cold or chills. Feel weak, sleepy, or tired (fatigue). Have soreness and body aches. These can affect parts of the body that were not involved in surgery. Follow these instructions at home: For the time period you were told by your health care provider:  Rest. Do not participate in activities where you could fall or become injured. Do not drive or use machinery. Do not drink alcohol. Do not take sleeping pills or medicines that cause drowsiness. Do not make important decisions or sign legal documents. Do not take care of children on your own. General instructions Drink enough fluid to keep your urine pale yellow. If you have sleep apnea, surgery and certain medicines can increase your risk for breathing problems. Follow instructions from your health care provider about wearing your sleep device: Anytime you are sleeping, including during daytime naps. While taking prescription pain medicines, sleeping medicines, or medicines that make you drowsy. Return to your normal activities as told by your health care provider. Ask your health care provider what activities are safe for you. Take over-the-counter and prescription medicines only as told by your health care provider. Do not use any products that contain nicotine or tobacco. These  products include cigarettes, chewing tobacco, and vaping devices, such as e-cigarettes. These can delay incision healing after surgery. If you need help quitting, ask your health care provider. Contact a health care provider if: You have nausea or vomiting that does not get better with medicine. You vomit every time you eat or drink. You have pain that does not get better with medicine. You cannot urinate or have bloody urine. You develop a skin rash. You have a fever. Get help right away if: You have trouble breathing. You have chest pain. You vomit blood. These symptoms may be an emergency. Get help right away. Call 911. Do not wait to see if the symptoms will go away. Do not drive yourself to the hospital. Summary After the procedure, it is common to have a sore throat, hoarseness, nausea, vomiting, or to feel weak, sleepy, or fatigue. For the time period you were told by your health care provider, do not drive or use machinery. Get help right away if you have difficulty breathing, have chest pain, or vomit blood. These symptoms may be an emergency. This information is not intended to replace advice given to you by your health care provider. Make sure you discuss any questions you have with your health care provider. Document Revised: 12/26/2021 Document Reviewed: 12/26/2021 Elsevier Patient Education  2024 [employer].

## 2024-05-24 ENCOUNTER — Telehealth: Payer: Self-pay

## 2024-05-24 NOTE — Telephone Encounter (Signed)
 Communication documented in surgery workque

## 2024-05-24 NOTE — Telephone Encounter (Signed)
 Calling in regarded to some authorization from prinary MD pt would like a c/b asap

## 2024-05-24 NOTE — Telephone Encounter (Signed)
 Called pt to pick up form he stated he did not have a ride and needed paper mailed. Placed in mail 05/24/24

## 2024-05-24 NOTE — Telephone Encounter (Signed)
 Pt called stating he was returning a phone call from [place] pt advised a message would be sent to her for her to c/b pt

## 2024-05-25 ENCOUNTER — Encounter (HOSPITAL_COMMUNITY): Payer: Self-pay

## 2024-05-25 ENCOUNTER — Encounter (HOSPITAL_COMMUNITY)
Admission: RE | Admit: 2024-05-25 | Discharge: 2024-05-25 | Disposition: A | Discharge status: Home / Self Care | Source: Ambulatory Visit | Attending: Urology | Admitting: Urology

## 2024-05-26 ENCOUNTER — Other Ambulatory Visit (HOSPITAL_COMMUNITY): Payer: Self-pay

## 2024-05-26 ENCOUNTER — Encounter: Payer: Self-pay | Admitting: "Endocrinology

## 2024-05-26 ENCOUNTER — Telehealth: Payer: Self-pay

## 2024-05-26 ENCOUNTER — Ambulatory Visit (INDEPENDENT_AMBULATORY_CARE_PROVIDER_SITE_OTHER): Admitting: "Endocrinology

## 2024-05-26 VITALS — BP 132/76 | HR 89 | Ht 69.0 in | Wt 335.2 lb

## 2024-05-26 DIAGNOSIS — E1159 Type 2 diabetes mellitus with other circulatory complications: Secondary | ICD-10-CM | POA: Diagnosis not present

## 2024-05-26 DIAGNOSIS — I10 Essential (primary) hypertension: Secondary | ICD-10-CM

## 2024-05-26 DIAGNOSIS — E782 Mixed hyperlipidemia: Secondary | ICD-10-CM | POA: Diagnosis not present

## 2024-05-26 DIAGNOSIS — Z794 Long term (current) use of insulin: Secondary | ICD-10-CM

## 2024-05-26 LAB — POCT GLYCOSYLATED HEMOGLOBIN (HGB A1C)

## 2024-05-26 MED ORDER — ACCU-CHEK GUIDE TEST VI STRP
ORAL_STRIP | 12 refills | Status: DC
Start: 2024-05-26 — End: 2024-07-03

## 2024-05-26 MED ORDER — DEXCOM G7 RECEIVER DEVI
0 refills | Status: AC
Start: 2024-05-26 — End: ?

## 2024-05-26 MED ORDER — ATORVASTATIN CALCIUM 80 MG PO TABS: 80.0000 mg | ORAL_TABLET | Freq: Every day | ORAL | 1 refills | Status: AC

## 2024-05-26 MED ORDER — METFORMIN HCL ER 500 MG PO TB24: 500.0000 mg | ORAL_TABLET | Freq: Every day | ORAL | 1 refills | Status: AC

## 2024-05-26 MED ORDER — OZEMPIC (0.25 OR 0.5 MG/DOSE) 2 MG/3ML ~~LOC~~ SOPN
0.5000 mg | PEN_INJECTOR | SUBCUTANEOUS | 0 refills | Status: AC
Start: 2024-05-26 — End: ?

## 2024-05-26 MED ORDER — DEXCOM G7 SENSOR MISC
2 refills | Status: AC
Start: 2024-05-26 — End: ?

## 2024-05-26 MED ORDER — ACCU-CHEK GUIDE ME W/DEVICE KIT
1.0000 | PACK | 0 refills | Status: AC
Start: 2024-05-26 — End: ?

## 2024-05-26 NOTE — Progress Notes (Signed)
 Endocrinology Consult Note       05/26/2024, 12:02 PM   Subjective:    Patient ID: Ross Schmidt, male    DOB: 1985-12-29.  Ross Schmidt is being seen in consultation for management of currently uncontrolled symptomatic diabetes requested by  Carlette Benita Area, MD.   Past Medical History:  Diagnosis Date   Asthma    CHF (congestive heart failure) (HCC)    COPD (chronic obstructive pulmonary disease) (HCC)    CVA (cerebral vascular accident) (HCC) 07/06/2022   Diabetes mellitus without complication (HCC)    HLD (hyperlipidemia) 08/11/2016   Hypertension    Obesity    Oxygen deficiency    Premature baby    Sleep apnea     Past Surgical History:  Procedure Laterality Date   BUBBLE STUDY  07/23/2022   Procedure: BUBBLE STUDY;  Surgeon: Mona Vinie BROCKS, MD;  Location: [facility];  Service: Cardiovascular;;   LEFT HEART CATH AND CORONARY ANGIOGRAPHY N/A 06/01/2017   Procedure: LEFT HEART CATH AND CORONARY ANGIOGRAPHY;  Surgeon: Dann Candyce RAMAN, MD;  Location: [facility] INVASIVE CV LAB;  Service: Cardiovascular;  Laterality: N/A;   TEE WITHOUT CARDIOVERSION N/A 07/23/2022   Procedure: TRANSESOPHAGEAL ECHOCARDIOGRAM (TEE);  Surgeon: Mona Vinie BROCKS, MD;  Location: [facility] ENDOSCOPY;  Service: Cardiovascular;  Laterality: N/A;   TONSILLECTOMY      Social History   Socioeconomic History   Marital status: Single    Spouse name: Not on file   Number of children: 0   Years of education: 10   Highest education level: Not on file  Occupational History    Comment: disability  Tobacco Use   Smoking status: Former    Current packs/day: 0.00    Average packs/day: 0.3 packs/day for 18.3 years (4.6 ttl pk-yrs)    Types: Cigarettes    Start date: 10/12/1997    Quit date: 02/10/2016    Years since quitting: 8.2   Smokeless tobacco: Never  Vaping Use   Vaping status: Never Used  Substance and  Sexual Activity   Alcohol use: No   Drug use: No    Comment: hx of marijuana use, quit recently   Sexual activity: Not Currently  Other Topics Concern   Not on file  Social History Narrative   Lives with mother and step father   disabled   Social Drivers of Corporate investment banker Strain: Not on file  Food Insecurity: No Food Insecurity (07/06/2022)   Hunger Vital Sign    Worried About Running Out of Food in the Last Year: Never true    Ran Out of Food in the Last Year: Never true  Transportation Needs: Unmet Transportation Needs (05/17/2023)   PRAPARE - Administrator, Civil Service (Medical): Yes    Lack of Transportation (Non-Medical): No  Physical Activity: Not on file  Stress: Not on file  Social Connections: Not on file    Family History  Problem Relation Age of Onset   Diabetes Maternal Uncle    Heart disease Maternal Uncle    Diabetes Maternal Grandmother    Hypertension Maternal Grandmother  Miscarriages / Stillbirths Maternal Grandfather     Outpatient Encounter Medications as of 05/26/2024  Medication Sig   albuterol (VENTOLIN HFA) 108 (90 Base) MCG/ACT inhaler Inhale 1-2 puffs into the lungs every 6 (six) hours as needed for wheezing or shortness of breath.   allopurinol  (ZYLOPRIM ) 300 MG tablet Take 300 mg by mouth daily as needed (gout).   amLODipine  (NORVASC ) 10 MG tablet Take 1 tablet (10 mg total) by mouth daily.   atorvastatin  (LIPITOR) 40 MG tablet TAKE 1 TABLET BY MOUTH DAILY (Patient taking differently: Take 40 mg by mouth at bedtime.)   blood glucose meter kit and supplies KIT Dispense based on patient and insurance preference. Use up to four times daily as directed. (FOR ICD-9 250.00, 250.01).   Blood Glucose Monitoring Suppl (ACCU-CHEK GUIDE ME) w/Device KIT 1 Piece by Does not apply route as directed.   carvedilol  (COREG ) 25 MG tablet TAKE ONE TABLET BY MOUTH TWICE DAILY WITH FOOD   cloNIDine  (CATAPRES ) 0.2 MG tablet Take 1 tablet  (0.2 mg total) by mouth 3 (three) times daily.   clotrimazole -betamethasone  (LOTRISONE ) cream Apply 1 Application topically 2 (two) times daily.   Continuous Glucose Receiver (DEXCOM G7 RECEIVER) DEVI Use to monitor BG continuously   Continuous Glucose Sensor (DEXCOM G7 SENSOR) MISC Change sensor every 10 days   glucose blood (ACCU-CHEK GUIDE TEST) test strip Use to monitor glucose 4 times daily as instructed   LANTUS SOLOSTAR 100 UNIT/ML Solostar Pen Inject 40 Units into the skin at bedtime.   metFORMIN  (GLUCOPHAGE -XR) 500 MG 24 hr tablet Take 1 tablet (500 mg total) by mouth daily with breakfast.   NON FORMULARY Pt uses a bi-pap nightly   Semaglutide ,0.25 or 0.5MG /DOS, (OZEMPIC , 0.25 OR 0.5 MG/DOSE,) 2 MG/3ML SOPN Inject 0.5 mg into the skin once a week.   valsartan  (DIOVAN ) 320 MG tablet Take 1 tablet (320 mg total) by mouth daily.   [DISCONTINUED] ACCU-CHEK GUIDE TEST test strip    clopidogrel  (PLAVIX ) 75 MG tablet Take 75 mg by mouth in the morning.   No facility-administered encounter medications on file as of 05/26/2024.    ALLERGIES: Allergies  Allergen Reactions   Hydralazine  Other (See Comments)    Dizziness   Morphine And Codeine Palpitations    Heart beat fast    Nicoderm [Nicotine] Rash    Rash from the patch     VACCINATION STATUS: Immunization History  Administered Date(s) Administered   H1N1 07/26/2017   Influenza Whole 08/17/2022   Influenza,inj,Quad PF,6+ Mos 08/25/2016   Pneumococcal Conjugate-13 08/25/2016    Diabetes He presents for his initial diabetic visit. He has type 2 diabetes mellitus. Onset time: He was diagnosed at approximate age of 36 years. There are no hypoglycemic associated symptoms. Pertinent negatives for hypoglycemia include no confusion, headaches, pallor or seizures. Associated symptoms include blurred vision, polydipsia and polyuria. Pertinent negatives for diabetes include no chest pain, no fatigue, no polyphagia and no weakness. There  are no hypoglycemic complications. Symptoms are worsening. Diabetic complications include heart disease. (Morbid obesity, comorbid hyperlipidemia, hypertension.) Risk factors for coronary artery disease include diabetes mellitus, dyslipidemia, family history, hypertension, male sex, obesity and sedentary lifestyle. Current diabetic treatments: Patient was supposed to be on Lantus 20 units, however did not take any insulin  in the last 3 weeks.  He developed complications from Jardiance  involving genital and skin infections.  Patient is scheduled to have circumcision. His weight is fluctuating minimally. He is following a generally unhealthy diet. When asked about meal  planning, he reported none. He has not had a previous visit with a dietitian. He never participates in exercise. (His A1c was >15.5% during his hospitalization in April.  His point-of-care A1c today is 14.6%.  He did not bring any logs nor meter with him.  He states that he does not have a functioning meter at all.) An ACE inhibitor/angiotensin II receptor blocker is being taken. He does not see a podiatrist.Eye exam is not current.  Hyperlipidemia This is a chronic problem. The current episode started more than 1 year ago. The problem is uncontrolled. Recent lipid tests were reviewed and are high. Exacerbating diseases include diabetes and obesity. Pertinent negatives include no chest pain, myalgias or shortness of breath. Current antihyperlipidemic treatment includes statins. Risk factors for coronary artery disease include dyslipidemia, diabetes mellitus, male sex, obesity, family history, hypertension and a sedentary lifestyle.  Hypertension The current episode started more than 1 year ago. The problem is controlled. Associated symptoms include blurred vision. Pertinent negatives include no chest pain, headaches, neck pain, palpitations or shortness of breath. Risk factors for coronary artery disease include dyslipidemia, diabetes mellitus, male  gender, obesity, sedentary lifestyle and family history. Past treatments include angiotensin blockers and calcium  channel blockers. Hypertensive end-organ damage includes heart failure.     Review of Systems  Constitutional:  Negative for chills, fatigue, fever and unexpected weight change.  HENT:  Negative for dental problem, mouth sores and trouble swallowing.   Eyes:  Positive for blurred vision. Negative for visual disturbance.  Respiratory:  Negative for cough, choking, chest tightness, shortness of breath and wheezing.   Cardiovascular:  Negative for chest pain, palpitations and leg swelling.  Gastrointestinal:  Negative for abdominal distention, abdominal pain, constipation, diarrhea, nausea and vomiting.  Endocrine: Positive for polydipsia and polyuria. Negative for polyphagia.  Genitourinary:  Negative for dysuria, flank pain, hematuria and urgency.  Musculoskeletal:  Negative for back pain, gait problem, myalgias and neck pain.  Skin:  Negative for pallor, rash and wound.  Neurological:  Negative for seizures, syncope, weakness, numbness and headaches.  Psychiatric/Behavioral:  Negative for confusion.     Objective:       05/26/2024    9:34 AM 04/28/2024   11:26 AM 04/05/2024   11:02 AM  Vitals with BMI  Height 5' 9 5' 9   Weight 335 lbs 3 oz 336 lbs 14 oz   BMI 49.48 49.72   Systolic 132  152  Diastolic 76  95  Pulse 89  87    BP 132/76 (BP Location: Right Arm, Patient Position: Sitting, Cuff Size: Large)   Pulse 89   Ht 5' 9 (1.753 m)   Wt (!) 335 lb 3.2 oz (152 kg)   BMI 49.50 kg/m   Wt Readings from Last 3 Encounters:  05/26/24 (!) 335 lb 3.2 oz (152 kg)  04/28/24 (!) 336 lb 13.8 oz (152.8 kg)  02/08/24 (!) 336 lb 13.8 oz (152.8 kg)     Physical Exam Constitutional:      General: He is not in acute distress.    Appearance: He is well-developed.  HENT:     Head: Normocephalic and atraumatic.  Neck:     Thyroid: No thyromegaly.     Trachea: No  tracheal deviation.  Cardiovascular:     Rate and Rhythm: Normal rate.     Pulses:          Dorsalis pedis pulses are 1+ on the right side and 1+ on the left side.  Posterior tibial pulses are 1+ on the right side and 1+ on the left side.     Heart sounds: Normal heart sounds, S1 normal and S2 normal. No murmur heard.    No gallop.  Pulmonary:     Effort: No respiratory distress.     Breath sounds: Normal breath sounds. No wheezing.  Abdominal:     General: Bowel sounds are normal. There is no distension.     Palpations: Abdomen is soft.     Tenderness: There is no abdominal tenderness. There is no guarding.  Musculoskeletal:     Right shoulder: No swelling or deformity.     Cervical back: Normal range of motion and neck supple.  Skin:    General: Skin is warm and dry.     Findings: No rash.     Nails: There is no clubbing.     Comments: Extensive acanthosis nigricans on his neck skin  Neurological:     Mental Status: He is alert and oriented to person, place, and time.     Cranial Nerves: No cranial nerve deficit.     Sensory: No sensory deficit.     Gait: Gait normal.     Deep Tendon Reflexes: Reflexes are normal and symmetric.  Psychiatric:        Speech: Speech normal.        Behavior: Behavior normal. Behavior is cooperative.        Thought Content: Thought content normal.        Judgment: Judgment normal.      CMP ( most recent) CMP     Component Value Date/Time   NA 136 02/09/2024 1045   K 4.1 02/09/2024 1045   CL 98 02/09/2024 1045   CO2 27 02/09/2024 1045   GLUCOSE 354 (H) 02/09/2024 1045   BUN 16 02/09/2024 1045   BUN 17 12/08/2022 0000   CREATININE 0.97 02/09/2024 1045   CREATININE 0.89 12/15/2016 1602   CALCIUM  8.4 (L) 02/09/2024 1045   PROT 7.7 07/06/2022 1048   ALBUMIN 3.9 07/06/2022 1048   AST 19 07/06/2022 1048   ALT 20 07/06/2022 1048   ALKPHOS 69 07/06/2022 1048   BILITOT 0.9 07/06/2022 1048   EGFR 82 12/08/2022 0000   GFRNONAA >60  02/09/2024 1045   GFRNONAA >89 12/15/2016 1602     Diabetic Labs (most recent): Lab Results  Component Value Date   HGBA1C >15.5 (H) 02/09/2024   HGBA1C 10.3 12/08/2022   HGBA1C 8.2 (H) 07/07/2022   MICROALBUR >600.0 12/15/2016     Lipid Panel ( most recent) Lipid Panel     Component Value Date/Time   CHOL 137 07/07/2022 0407   TRIG 195 (A) 12/08/2022 0000   HDL 27 (L) 07/07/2022 0407   CHOLHDL 5.1 07/07/2022 0407   VLDL 34 07/07/2022 0407   LDLCALC 207 12/08/2022 0000      Lab Results  Component Value Date   TSH 1.86 12/15/2016      Assessment & Plan:   1. Type 2 diabetes mellitus with obesity (HCC) (Primary)  - Ross Schmidt has currently uncontrolled symptomatic type 2 DM since  38 years of age,  with most recent A1c of 14.6 %. Recent labs reviewed.  His A1c was  >15.5% in April 2025 during her hospitalization. - I had a long discussion with him about the possible risk factors and  the pathology behind diabetes and its complications. -his diabetes is complicated by morbid obesity, comorbid hypertension hyperlipidemia, CHF and he remains at  exceedingly  high risk for more acute and chronic complications which include CAD, CVA, CKD, retinopathy, and neuropathy. These are all discussed in detail with him.  - I discussed all available options of managing his diabetes including de-escalation of medications. I have counseled him on Food as Medicine by adopting a Whole Food , Plant Predominant  ( WFPP) nutrition as recommended by [employer] of Lifestyle Medicine. Patient is encouraged to switch to  unprocessed or minimally processed  complex starch, adequate protein intake (mainly plant source), minimal liquid fat, plenty of fruits, and vegetables. -  he is advised to stick to a routine mealtimes to eat 3 complete meals a day and snack only when necessary (to snack only to correct hypoglycemia BG <70 day time or <100 at night).   - he acknowledges that there is a  room for improvement in his food and drink choices. - Further Specific Suggestion is made for him to avoid simple carbohydrates  from his diet including Cakes, Sweet Desserts, Ice Cream, Soda (diet and regular), Sweet Tea, Candies, Chips, Cookies, Store Bought Juices, Alcohol ,  Artificial Sweeteners,  Coffee Creamer, and Sugar-free Products. This will help patient to have more stable blood glucose profile and potentially avoid unintended weight gain.   - he will be scheduled with Penny Crumpton, RDN, CDE for individualized diabetes education.  - I have approached him with the following individualized plan to manage  his diabetes and patient agrees:   - It appears that a trial of Jardiance  caused significant genital skin infection and uncircumcised male-scheduled for circumcision.   - This class of medications would be postponed until he is more suitable. -In the meantime, he will need multiple modality treatment in order for him to achieve control of diabetes to target. - Accordingly, I advised him to resume and increase Lantus to 40 units nightly associated with monitoring of blood glucose 4 times a day-before meals and at bedtime until he returns in 10 days for reevaluation. -I discussed and prescribed a glucometer for him he will also benefit from a CGM, discussed and prescribed a Dexcom device for him. -I discussed and gave him samples of Ozempic  to start 0.25 mg subcutaneously weekly.  Side effects and precautions discussed with him.  He would greatly benefit from this class of medications as well.  I discussed and prescribed Ozempic  to his pharmacy.  This medication will be advanced per protocol as he tolerates.  - I discussed and prescribed metformin  500 mg p.o. once a day at breakfast.  This medication will be advanced as he tolerates. - he is warned not to take insulin  without proper monitoring per orders. - Adjustment parameters are given to him for hypo and hyperglycemia in writing. -  he is encouraged to call clinic for blood glucose levels less than 70 or above 300 mg /dl.  - Specific targets for  A1c;  LDL, HDL,  and Triglycerides were discussed with the patient.  2) Blood Pressure /Hypertension:  his blood pressure is  controlled to target.   he is advised to continue his current medications including amlodipine  10 mg p.o. daily, carvedilol  25 mg p.o. twice daily, clonidine  0.2 mg 3 times a day, valsartan  320 mg p.o. once a day.     3) Lipids/Hyperlipidemia:   Review of his recent lipid panel showed uncontrolled  LDL at 207 .  he  is advised to increase atorvastatin  to 80 mg daily at bedtime.  Side effects and precautions discussed with him. He  will be considered for additional interventions with either ezetimibe or PCSK9 inhibitors on subsequent visit.  4)  Weight/Diet:  Body mass index is 49.5 kg/m.  -   clearly complicating his diabetes care.   he is  a candidate for weight loss. I discussed with him the fact that loss of 5 - 10% of his  current body weight will have the most impact on his diabetes management.  He is a good candidate for bariatric/metabolic surgery.  He will be approached for this procedure on next visit.  5) Chronic Care/Health Maintenance:  -he  is on ACEI/ARB and Statin medications and  is encouraged to initiate and continue to follow up with Ophthalmology, Dentist,  Podiatrist at least yearly or according to recommendations, and advised to   stay away from smoking. I have recommended yearly flu vaccine and pneumonia vaccine at least every 5 years; moderate intensity exercise for up to 150 minutes weekly; and  sleep for 7- 9 hours a day.  - he is  advised to maintain close follow up with Fanta, Tesfaye Demissie, MD for primary care needs, as well as his other providers for optimal and coordinated care.   Thank you for involving me in the care of this pleasant patient.  I spent  62  minutes in the care of the patient today including review of labs from  CMP, Lipids, Thyroid Function, Hematology (current and previous including abstractions from other facilities); face-to-face time discussing  his blood glucose readings/logs, discussing hypoglycemia and hyperglycemia episodes and symptoms, medications doses, his options of short and long term treatment based on the latest standards of care / guidelines;  discussion about incorporating lifestyle medicine;  and documenting the encounter. Risk reduction counseling performed per USPSTF guidelines to reduce  obesity and cardiovascular risk factors.      Please refer to Patient Instructions for Blood Glucose Monitoring and Insulin /Medications Dosing Guide  in media tab for additional information. Please  also refer to  Patient Self Inventory in the Media  tab for reviewed elements of pertinent patient history.  Ross Schmidt participated in the discussions, expressed understanding, and voiced agreement with the above plans.  All questions were answered to his satisfaction. he is encouraged to contact clinic should he have any questions or concerns prior to his return visit.   Follow up plan: - Return in about 10 days (around 06/05/2024) for F/U with Meter/CGM /Logs Only - no Labs.  Ranny Earl, MD [facility] [address] [place], [place] [address] Phone: [phone]  Fax: [phone]    05/26/2024, 12:02 PM  This note was partially dictated with voice recognition software. Similar sounding words can be transcribed inadequately or may not  be corrected upon review.

## 2024-05-26 NOTE — Patient Instructions (Signed)

## 2024-05-26 NOTE — Telephone Encounter (Signed)
 Pharmacy Patient Advocate Encounter   Received notification from CoverMyMeds that prior authorization for Dexcom G7 sensor is required/requested.   Insurance verification completed.   The patient is insured through [facility] .   Per test claim: PA required; PA submitted to above mentioned insurance via Latent Key/confirmation #/EOC [id number] Status is pending

## 2024-06-05 NOTE — Telephone Encounter (Signed)
 Pharmacy Patient Advocate Encounter  Received notification from [employer] that Prior Authorization for Dexcom G7 sensor has been APPROVED from 05/26/24 to 11/26/24   PA #/Case ID/Reference #: [id number]

## 2024-06-08 ENCOUNTER — Ambulatory Visit: Admitting: "Endocrinology

## 2024-06-08 ENCOUNTER — Telehealth: Payer: Self-pay | Admitting: "Endocrinology

## 2024-06-08 NOTE — Telephone Encounter (Signed)
 Opened to send message for refill, pt stated never picked up old rx so will call pharmacy to pick that up

## 2024-06-14 ENCOUNTER — Ambulatory Visit: Admitting: Urology

## 2024-06-21 ENCOUNTER — Encounter: Payer: Self-pay | Admitting: Internal Medicine

## 2024-06-22 ENCOUNTER — Ambulatory Visit: Admitting: "Endocrinology

## 2024-06-29 ENCOUNTER — Ambulatory Visit (HOSPITAL_COMMUNITY): Admission: RE | Admit: 2024-06-29 | Source: Home / Self Care | Admitting: Urology

## 2024-06-29 ENCOUNTER — Encounter (HOSPITAL_COMMUNITY): Admission: RE | Payer: Self-pay | Source: Home / Self Care

## 2024-06-29 SURGERY — CIRCUMCISION, ADULT
Anesthesia: General

## 2024-07-03 ENCOUNTER — Telehealth: Payer: Self-pay

## 2024-07-03 ENCOUNTER — Telehealth: Payer: Self-pay | Admitting: "Endocrinology

## 2024-07-03 ENCOUNTER — Other Ambulatory Visit: Payer: Self-pay | Admitting: *Deleted

## 2024-07-03 DIAGNOSIS — E1159 Type 2 diabetes mellitus with other circulatory complications: Secondary | ICD-10-CM

## 2024-07-03 MED ORDER — BLOOD GLUCOSE TEST STRIPS 333 VI STRP: ORAL_STRIP | 1 refills | Status: AC

## 2024-07-03 MED ORDER — BLOOD GLUCOSE MONITOR KIT: PACK | 0 refills | Status: DC

## 2024-07-03 MED ORDER — LANCETS MISC: 2 refills | Status: AC

## 2024-07-03 MED ORDER — BLOOD GLUCOSE MONITOR KIT: PACK | 0 refills | Status: AC

## 2024-07-03 NOTE — Telephone Encounter (Signed)
 Copied from CRM [id number]. Topic: General - Other >> Jul 03, 2024  8:57 AM Ross Schmidt ORN wrote: Reason for CRM: patient called to state he had Requal for Drivers Lic paperwork that needed to be filled out last OV with almarie ferrari 01/21/24. I attempted to schedule an OV for the patient 07/04/24 he stated that was too soon  he uses transportation, the next opening was not until 08/09/24. Offered to schedule appointment and place on wait list- patient requested to see if he can have the paperwork filled out without an appointment.  CALLBACK # [phone]

## 2024-07-03 NOTE — Telephone Encounter (Signed)
 Rx's for glucometer, lancets and test strips sent to [facility].

## 2024-07-03 NOTE — Telephone Encounter (Signed)
 Pt states he has still not been checking his sugar because the meter was not covered by insurance. Can you call something else in for him?

## 2024-07-04 NOTE — Telephone Encounter (Signed)
 Per my last note I did not have compliance data to be able to fill out DMV paperwork. I am happy to fill out form if we can get proof that he is using BIPAP. DME is [facility].   Needs BiPAP usage documentation for DMV to reinstate driving privileges.  - Advise to follow up with [facility] for BiPAP data download or AirView enrollment.

## 2024-07-12 ENCOUNTER — Ambulatory Visit: Admitting: Urology

## 2024-07-13 NOTE — Telephone Encounter (Signed)
 Called [facility] and spoke with Terri. I asked them to add this pt to airview so we can get a dl.  Per Burman, the pt does not have any equipment from them. He turned in his machine 11/09/23 due to non compliance. I called and spoke with the pt. He states he does have a BIPAP and it came from [facility]. He does not have an SD card in the machine. I advised that he can take his machine to DME and have DL pulled and give us  that information so Landry can review. Pt verbalized understanding.

## 2024-07-13 NOTE — Telephone Encounter (Signed)
 3

## 2024-07-23 ENCOUNTER — Other Ambulatory Visit: Payer: Self-pay | Admitting: Urology

## 2024-08-24 ENCOUNTER — Ambulatory Visit: Admitting: Pulmonary Disease

## 2024-08-24 VITALS — BP 160/92 | HR 83 | Ht 69.0 in | Wt 338.0 lb

## 2024-08-24 DIAGNOSIS — G4733 Obstructive sleep apnea (adult) (pediatric): Secondary | ICD-10-CM

## 2024-08-24 NOTE — Progress Notes (Unsigned)
 [address] [name]    [id number]    1986/05/24  Primary Care Physician:Fanta, Tesfaye Demissie, MD  Referring Physician: Carlette Benita Area, MD [address] [place],  [place] [address]  Chief complaint:   Patient with obstructive sleep apnea Morbid obesity In for follow-up  HPI:  Has had a machine for about a year Has not been using the machine He brought the machine into the office and the machine is filthy, has a lot of dust and grime on it He has not been using the BiPAP  He stated he contacted [employer] and [employer] told him to bring the machine to the office  Still continues to have daytime sleepiness, waking up tired, fatigued during the day  Feeling unwell generally  He is a reformed smoker, history of systolic heart failure, pulmonary hypertension diabetes, heart failure with improved ejection fraction  Denies feeling short of breath at rest but does get short of breath with activity  His weight has been stable, does not exercise, tries to remain active  He was on BiPAP previously and was using it but not recentl He does follow-up with cardiology . Outpatient Encounter Medications as of 08/24/2024  Medication Sig   albuterol (VENTOLIN HFA) 108 (90 Base) MCG/ACT inhaler Inhale 1-2 puffs into the lungs every 6 (six) hours as needed for wheezing or shortness of breath.   allopurinol  (ZYLOPRIM ) 300 MG tablet Take 300 mg by mouth daily as needed (gout).   amLODipine  (NORVASC ) 10 MG tablet Take 1 tablet (10 mg total) by mouth daily.   atorvastatin  (LIPITOR) 80 MG tablet Take 1 tablet (80 mg total) by mouth at bedtime.   blood glucose meter kit and supplies KIT Dispense based on patient and insurance preference. Use to check blood glucose four times daily as directed.   Blood Glucose Monitoring Suppl (ACCU-CHEK GUIDE ME) w/Device KIT 1 Piece by Does not apply route as directed.   carvedilol  (COREG ) 25 MG tablet TAKE ONE TABLET BY MOUTH TWICE DAILY WITH  FOOD   cloNIDine  (CATAPRES ) 0.2 MG tablet Take 1 tablet (0.2 mg total) by mouth 3 (three) times daily.   clopidogrel  (PLAVIX ) 75 MG tablet Take 75 mg by mouth in the morning.   clotrimazole -betamethasone  (LOTRISONE ) cream APPLY TO THE AFFECTED AREA(S) TWICE DAILY   Continuous Glucose Receiver (DEXCOM G7 RECEIVER) DEVI Use to monitor BG continuously   Continuous Glucose Sensor (DEXCOM G7 SENSOR) MISC Change sensor every 10 days   Glucose Blood (BLOOD GLUCOSE TEST STRIPS 333) STRP Use to check blood glucose four time daily as directed.   Lancets MISC Use to check blood glucose four times daily as directed.   LANTUS SOLOSTAR 100 UNIT/ML Solostar Pen Inject 40 Units into the skin at bedtime.   metFORMIN  (GLUCOPHAGE -XR) 500 MG 24 hr tablet Take 1 tablet (500 mg total) by mouth daily with breakfast.   NON FORMULARY Pt uses a bi-pap nightly   Semaglutide ,0.25 or 0.5MG /DOS, (OZEMPIC , 0.25 OR 0.5 MG/DOSE,) 2 MG/3ML SOPN Inject 0.5 mg into the skin once a week.   valsartan  (DIOVAN ) 320 MG tablet Take 1 tablet (320 mg total) by mouth daily.   No facility-administered encounter medications on file as of 08/24/2024.    Allergies as of 08/24/2024 - Review Complete 08/24/2024  Allergen Reaction Noted   Hydralazine  Other (See Comments) 04/09/2023   Morphine and codeine Palpitations 09/14/2016   Nicoderm [nicotine] Rash 09/14/2016    Past Medical History:  Diagnosis Date  Asthma    CHF (congestive heart failure) (HCC)    COPD (chronic obstructive pulmonary disease) (HCC)    CVA (cerebral vascular accident) (HCC) 07/06/2022   Diabetes mellitus without complication (HCC)    HLD (hyperlipidemia) 08/11/2016   Hypertension    Obesity    Oxygen deficiency    Premature baby    Sleep apnea     Past Surgical History:  Procedure Laterality Date   BUBBLE STUDY  07/23/2022   Procedure: BUBBLE STUDY;  Surgeon: Mona Vinie BROCKS, MD;  Location: [facility] ENDOSCOPY;  Service: Cardiovascular;;   LEFT HEART CATH  AND CORONARY ANGIOGRAPHY N/A 06/01/2017   Procedure: LEFT HEART CATH AND CORONARY ANGIOGRAPHY;  Surgeon: Dann Candyce RAMAN, MD;  Location: [facility] INVASIVE CV LAB;  Service: Cardiovascular;  Laterality: N/A;   TEE WITHOUT CARDIOVERSION N/A 07/23/2022   Procedure: TRANSESOPHAGEAL ECHOCARDIOGRAM (TEE);  Surgeon: Mona Vinie BROCKS, MD;  Location: [facility] ENDOSCOPY;  Service: Cardiovascular;  Laterality: N/A;   TONSILLECTOMY      Family History  Problem Relation Age of Onset   Diabetes Maternal Uncle    Heart disease Maternal Uncle    Diabetes Maternal Grandmother    Hypertension Maternal Grandmother    Miscarriages / Stillbirths Maternal Grandfather     Social History   Socioeconomic History   Marital status: Single    Spouse name: Not on file   Number of children: 0   Years of education: 10   Highest education level: Not on file  Occupational History    Comment: disability  Tobacco Use   Smoking status: Former    Current packs/day: 0.00    Average packs/day: 0.3 packs/day for 18.3 years (4.6 ttl pk-yrs)    Types: Cigarettes    Start date: 10/12/1997    Quit date: 02/10/2016    Years since quitting: 8.5   Smokeless tobacco: Never  Vaping Use   Vaping status: Never Used  Substance and Sexual Activity   Alcohol use: No   Drug use: No    Comment: hx of marijuana use, quit recently   Sexual activity: Not Currently  Other Topics Concern   Not on file  Social History Narrative   Lives with mother and step father   disabled   Social Drivers of Corporate Investment Banker Strain: Not on file  Food Insecurity: No Food Insecurity (07/06/2022)   Hunger Vital Sign    Worried About Running Out of Food in the Last Year: Never true    Ran Out of Food in the Last Year: Never true  Transportation Needs: Unmet Transportation Needs (05/17/2023)   PRAPARE - Administrator, Civil Service (Medical): Yes    Lack of Transportation (Non-Medical): No  Physical Activity: Not on file  Stress:  Not on file  Social Connections: Not on file  Intimate Partner Violence: Not At Risk (07/06/2022)   Humiliation, Afraid, Rape, and Kick questionnaire    Fear of Current or Ex-Partner: No    Emotionally Abused: No    Physically Abused: No    Sexually Abused: No    Review of Systems  Constitutional:  Positive for fatigue.  HENT: Negative.    Eyes: Negative.   Respiratory:  Positive for apnea.   Cardiovascular: Negative.   Gastrointestinal: Negative.   Endocrine: Negative.   Genitourinary: Negative.   Musculoskeletal: Negative.   Psychiatric/Behavioral:  Positive for sleep disturbance.     Vitals:   08/24/24 1415  BP: (!) 160/92  Pulse: 83  SpO2: 95%  Physical Exam Constitutional:      Appearance: He is well-developed. He is obese.     Comments: obese  HENT:     Head: Normocephalic.     Mouth/Throat:     Mouth: Mucous membranes are moist.  Eyes:     Pupils: Pupils are equal, round, and reactive to light.  Neck:     Thyroid: No thyromegaly.  Cardiovascular:     Rate and Rhythm: Normal rate and regular rhythm.  Pulmonary:     Effort: Pulmonary effort is normal. No respiratory distress.     Breath sounds: Normal breath sounds. No stridor. No wheezing or rhonchi.  Musculoskeletal:        General: Normal range of motion.     Cervical back: No rigidity or tenderness.  Skin:    General: Skin is warm.  Neurological:     General: No focal deficit present.     Mental Status: He is alert and oriented to person, place, and time.     Deep Tendon Reflexes: Reflexes are normal and symmetric.  Psychiatric:        Mood and Affect: Mood normal.    Data Reviewed: Records reviewed  Last polysomnogram was in 2017-reviewed  Last office visit with cardiology 01/04/2024 with Almarie peck was reviewed  Assessment:   Obstructive sleep apnea - Has not been compliant with using BiPAP - The machine he brought to the office today was filthy - Needs to take the machine to  the DME company to evaluate - Last sleep study was in 2017 - Will need a new study to ascertain severity of his sleep disordered breathing and initiate therapy with BiPAP  Risk of progression of disease with noncompliance  Heart failure with improved ejection fraction - Continues to follow-up with cardiology - Compliant with management  History of nonobstructive coronary artery disease - Compliant with management  Pulmonary hypertension - This may be related to group 3 disease - Did need to get back to using BiPAP on a regular basis was discussed with the patient  Class III obesity - The need to work on weight loss, regular exercises, dietary management discussed with the patient  Plan/Recommendations:  Patient encouraged to take his machine into [employer] to have them evaluated and serviced the machine if possible  He will need a new sleep study with titration to BiPAP  Weight loss measures encouraged  Tentative follow-up in about 3 months  Importance of compliance with treatment discussed with the patient  Risk of progressive disease if noncompliant   Jennet Epley MD [facility] 08/24/2024, 2:21 PM  CC: Carlette San Demissie*

## 2024-08-24 NOTE — Patient Instructions (Signed)
 We will contact [employer] to see if anything can be done about the machine You may need to take the machine into them for them to take a look at it  You will need a new sleep study since her last sleep study was in 2017  Continue working on weight loss  Make sure you are following up with cardiology  Tentative follow-up in about 3 months  Call us  with significant concerns

## 2024-08-25 ENCOUNTER — Telehealth: Payer: Self-pay | Admitting: *Deleted

## 2024-08-25 NOTE — Telephone Encounter (Signed)
 Spoke with representative at  [employer] they stated the PT needs to bring the BiPap device in,as it should still be under warranty.Pt was advised at office visit 08/24/24 to take the device to the DME for evaluation.

## 2024-10-18 ENCOUNTER — Ambulatory Visit: Admitting: Urology

## 2024-10-18 DIAGNOSIS — Z412 Encounter for routine and ritual male circumcision: Secondary | ICD-10-CM

## 2024-11-05 ENCOUNTER — Ambulatory Visit (HOSPITAL_BASED_OUTPATIENT_CLINIC_OR_DEPARTMENT_OTHER): Admitting: Pulmonary Disease

## 2024-11-16 ENCOUNTER — Ambulatory Visit: Admitting: "Endocrinology

## 2025-01-15 ENCOUNTER — Ambulatory Visit (HOSPITAL_BASED_OUTPATIENT_CLINIC_OR_DEPARTMENT_OTHER): Admitting: Pulmonary Disease

## 2025-02-20 ENCOUNTER — Ambulatory Visit: Admitting: Pulmonary Disease
